# Patient Record
Sex: Female | Born: 1950
Health system: Southern US, Community
[De-identification: ages and names within clinical notes are randomized; demographics above are authoritative.]

## PROBLEM LIST (undated history)

## (undated) DIAGNOSIS — K259 Gastric ulcer, unspecified as acute or chronic, without hemorrhage or perforation: Secondary | ICD-10-CM

## (undated) DIAGNOSIS — S2239XA Fracture of one rib, unspecified side, initial encounter for closed fracture: Secondary | ICD-10-CM

## (undated) DIAGNOSIS — T7840XA Allergy, unspecified, initial encounter: Secondary | ICD-10-CM

## (undated) DIAGNOSIS — J45909 Unspecified asthma, uncomplicated: Secondary | ICD-10-CM

## (undated) DIAGNOSIS — D649 Anemia, unspecified: Secondary | ICD-10-CM

## (undated) DIAGNOSIS — I10 Essential (primary) hypertension: Secondary | ICD-10-CM

## (undated) DIAGNOSIS — B009 Herpesviral infection, unspecified: Secondary | ICD-10-CM

## (undated) DIAGNOSIS — K219 Gastro-esophageal reflux disease without esophagitis: Secondary | ICD-10-CM

## (undated) DIAGNOSIS — B019 Varicella without complication: Secondary | ICD-10-CM

## (undated) DIAGNOSIS — I341 Nonrheumatic mitral (valve) prolapse: Secondary | ICD-10-CM

## (undated) DIAGNOSIS — Z789 Other specified health status: Secondary | ICD-10-CM

## (undated) DIAGNOSIS — S2249XA Multiple fractures of ribs, unspecified side, initial encounter for closed fracture: Secondary | ICD-10-CM

## (undated) HISTORY — PX: ADENOIDECTOMY: SUR15

## (undated) HISTORY — DX: Gastro-esophageal reflux disease without esophagitis: K21.9

## (undated) HISTORY — PX: TONSILLECTOMY: SUR1361

## (undated) HISTORY — DX: Varicella without complication: B01.9

## (undated) HISTORY — DX: Allergy, unspecified, initial encounter: T78.40XA

## (undated) HISTORY — PX: APPENDECTOMY: SHX54

## (undated) HISTORY — DX: Essential (primary) hypertension: I10

## (undated) HISTORY — DX: Fracture of one rib, unspecified side, initial encounter for closed fracture: S22.39XA

## (undated) HISTORY — DX: Nonrheumatic mitral (valve) prolapse: I34.1

## (undated) HISTORY — DX: Gastric ulcer, unspecified as acute or chronic, without hemorrhage or perforation: K25.9

## (undated) HISTORY — DX: Herpesviral infection, unspecified: B00.9

## (undated) HISTORY — PX: HYSTEROSCOPY: SHX211

## (undated) HISTORY — DX: Anemia, unspecified: D64.9

## (undated) HISTORY — PX: HX TONSILLECTOMY: SHX27

## (undated) HISTORY — DX: Other specified health status: Z78.9

## (undated) HISTORY — PX: HX SEPTOPLASTY: SHX37

## (undated) HISTORY — PX: SINUS SURGERY: SHX187

## (undated) HISTORY — PX: HX APPENDECTOMY: SHX54

## (undated) HISTORY — PX: RIB FRACTURE SURGERY: SHX2358

---
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
## 2001-09-17 ENCOUNTER — Ambulatory Visit (HOSPITAL_COMMUNITY): Admission: RE | Admit: 2001-09-17 | Discharge: 2001-09-17 | Payer: Self-pay | Admitting: Gastroenterology

## 2003-06-09 ENCOUNTER — Encounter: Admission: RE | Admit: 2003-06-09 | Discharge: 2003-06-09 | Payer: Self-pay | Admitting: Orthopaedic Surgery

## 2005-02-06 ENCOUNTER — Other Ambulatory Visit: Admission: RE | Admit: 2005-02-06 | Discharge: 2005-02-06 | Payer: Self-pay | Admitting: Family Medicine

## 2005-02-14 ENCOUNTER — Encounter: Admission: RE | Admit: 2005-02-14 | Discharge: 2005-02-14 | Payer: Self-pay | Admitting: Family Medicine

## 2005-02-25 ENCOUNTER — Ambulatory Visit (HOSPITAL_COMMUNITY): Admission: RE | Admit: 2005-02-25 | Discharge: 2005-02-25 | Payer: Self-pay | Admitting: Family Medicine

## 2005-03-27 ENCOUNTER — Ambulatory Visit (HOSPITAL_BASED_OUTPATIENT_CLINIC_OR_DEPARTMENT_OTHER): Admission: RE | Admit: 2005-03-27 | Discharge: 2005-03-27 | Payer: Self-pay | Admitting: Obstetrics and Gynecology

## 2006-03-25 ENCOUNTER — Other Ambulatory Visit: Admission: RE | Admit: 2006-03-25 | Discharge: 2006-03-25 | Payer: Self-pay | Admitting: Obstetrics and Gynecology

## 2008-07-21 DIAGNOSIS — S82002A Unspecified fracture of left patella, initial encounter for closed fracture: Secondary | ICD-10-CM

## 2008-07-21 HISTORY — DX: Unspecified fracture of left patella, initial encounter for closed fracture: S82.002A

## 2012-01-06 LAB — HM MAMMOGRAPHY

## 2012-07-21 HISTORY — PX: NASAL SINUS SURGERY: SHX719

## 2012-12-09 DIAGNOSIS — J342 Deviated nasal septum: Secondary | ICD-10-CM | POA: Insufficient documentation

## 2012-12-09 HISTORY — DX: Deviated nasal septum: J34.2

## 2012-12-26 ENCOUNTER — Emergency Department (HOSPITAL_BASED_OUTPATIENT_CLINIC_OR_DEPARTMENT_OTHER): Payer: BC Managed Care – PPO

## 2012-12-26 ENCOUNTER — Emergency Department (HOSPITAL_BASED_OUTPATIENT_CLINIC_OR_DEPARTMENT_OTHER)
Admission: EM | Admit: 2012-12-26 | Discharge: 2012-12-26 | Disposition: A | Discharge status: Home / Self Care | Payer: BC Managed Care – PPO | Attending: Emergency Medicine | Admitting: Emergency Medicine

## 2012-12-26 ENCOUNTER — Encounter (HOSPITAL_BASED_OUTPATIENT_CLINIC_OR_DEPARTMENT_OTHER): Payer: Self-pay

## 2012-12-26 DIAGNOSIS — R059 Cough, unspecified: Secondary | ICD-10-CM | POA: Insufficient documentation

## 2012-12-26 DIAGNOSIS — R05 Cough: Secondary | ICD-10-CM | POA: Insufficient documentation

## 2012-12-26 DIAGNOSIS — J3489 Other specified disorders of nose and nasal sinuses: Secondary | ICD-10-CM | POA: Insufficient documentation

## 2012-12-26 DIAGNOSIS — J45909 Unspecified asthma, uncomplicated: Secondary | ICD-10-CM | POA: Insufficient documentation

## 2012-12-26 DIAGNOSIS — R509 Fever, unspecified: Secondary | ICD-10-CM | POA: Insufficient documentation

## 2012-12-26 DIAGNOSIS — E119 Type 2 diabetes mellitus without complications: Secondary | ICD-10-CM | POA: Insufficient documentation

## 2012-12-26 DIAGNOSIS — Z880 Allergy status to penicillin: Secondary | ICD-10-CM | POA: Insufficient documentation

## 2012-12-26 DIAGNOSIS — M546 Pain in thoracic spine: Secondary | ICD-10-CM | POA: Insufficient documentation

## 2012-12-26 HISTORY — DX: Unspecified asthma, uncomplicated: J45.909

## 2012-12-26 LAB — CBC WITH DIFFERENTIAL/PLATELET
Basophils Absolute: 0 10*3/uL (ref 0.0–0.1)
Basophils Relative: 0 % (ref 0–1)
Eosinophils Absolute: 0 10*3/uL (ref 0.0–0.7)
Eosinophils Relative: 0 % (ref 0–5)
HCT: 34.9 % — ABNORMAL LOW (ref 36.0–46.0)
Hemoglobin: 11.7 g/dL — ABNORMAL LOW (ref 12.0–15.0)
Lymphocytes Relative: 49 % — ABNORMAL HIGH (ref 12–46)
Lymphs Abs: 3 10*3/uL (ref 0.7–4.0)
MCH: 31.8 pg (ref 26.0–34.0)
MCHC: 33.5 g/dL (ref 30.0–36.0)
MCV: 94.8 fL (ref 78.0–100.0)
Monocytes Absolute: 0.9 10*3/uL (ref 0.1–1.0)
Monocytes Relative: 14 % — ABNORMAL HIGH (ref 3–12)
Neutro Abs: 2.2 10*3/uL (ref 1.7–7.7)
Neutrophils Relative %: 36 % — ABNORMAL LOW (ref 43–77)
Platelets: 179 10*3/uL (ref 150–400)
RBC: 3.68 MIL/uL — ABNORMAL LOW (ref 3.87–5.11)
RDW: 13.6 % (ref 11.5–15.5)
WBC: 6.1 10*3/uL (ref 4.0–10.5)

## 2012-12-26 LAB — BASIC METABOLIC PANEL
BUN: 15 mg/dL (ref 6–23)
CO2: 27 mEq/L (ref 19–32)
Calcium: 8.9 mg/dL (ref 8.4–10.5)
Chloride: 106 mEq/L (ref 96–112)
Creatinine, Ser: 0.9 mg/dL (ref 0.50–1.10)
GFR calc Af Amer: 79 mL/min — ABNORMAL LOW (ref >90)
GFR calc non Af Amer: 68 mL/min — ABNORMAL LOW (ref >90)
Glucose, Bld: 95 mg/dL (ref 70–99)
Potassium: 3.9 mEq/L (ref 3.5–5.1)
Sodium: 141 mEq/L (ref 135–145)

## 2012-12-26 LAB — TROPONIN I: Troponin I: <0.3 ng/mL (ref <0.30)

## 2012-12-26 LAB — D-DIMER, QUANTITATIVE: D-Dimer, Quant: 0.61 ug/mL-FEU — ABNORMAL HIGH (ref 0.00–0.48)

## 2012-12-26 MED ORDER — OXYCODONE-ACETAMINOPHEN 5-325 MG PO TABS
2.0000 | ORAL_TABLET | Freq: Once | ORAL | Status: AC
  Administered 2012-12-26: 2 via ORAL
  Filled 2012-12-26 (×2): qty 2

## 2012-12-26 MED ORDER — IOHEXOL 300 MG/ML  SOLN
80.0000 mL | Freq: Once | INTRAMUSCULAR | Status: AC | PRN
  Administered 2012-12-26: 80 mL via INTRAVENOUS

## 2012-12-26 MED ORDER — OXYCODONE-ACETAMINOPHEN 5-325 MG PO TABS: 1.0000 | ORAL_TABLET | ORAL | Status: DC | PRN

## 2012-12-26 MED ORDER — ASPIRIN 81 MG PO CHEW
324.0000 mg | CHEWABLE_TABLET | Freq: Once | ORAL | Status: AC
  Administered 2012-12-26: 324 mg via ORAL
  Filled 2012-12-26: qty 4

## 2012-12-26 NOTE — ED Notes (Signed)
Pt states that she has been having sinus congestion, difficulty breathing, chest pain in the upper chest, and upper back pain.  Pt states that she recently traveled 100 miles in the car, denies any other long distance travel.  Hx of asthma, uses inhaler every 2-3 hours.

## 2012-12-26 NOTE — ED Provider Notes (Signed)
History     CSN: [id number]  Arrival date & time 12/26/12  1005   First MD Initiated Contact with Patient 12/26/12 1024      Chief Complaint  Patient presents with  . Nasal Congestion  . Chest Pain    (Consider location/radiation/quality/duration/timing/severity/associated sxs/prior treatment) HPI Patient had sinus surgery at [facility] op on 5/22.  She states she has had pain between shoulder blades 5/24 with associated chest pain pressure anterior chest with sharp pain between shoulder blades constant eases with rest worsens with exertion.  Seen by ent recheck on 5/27 and told she had bronchitis and started on levaquin for 10 days.  Patient has had some cough with low grade fever of 99 to 101.5 since Saturday afte surgery.  Denies post op sinus problems except some drainage which is improved post op.  Patient called Dr. Foy Guadalajara on 6/6 because ent canceled post op appointment but unable to see.  Pain is worsening- patient taking albuterol without relief.  States pain is similar to previous asthma attacks.  Pain worse than prior but similar type of pain.  Pain is 8/10 today.  Pain has been 6/10.    Past Medical History  Diagnosis Date  . Asthma   . Diabetes mellitus without complication     Past Surgical History  Procedure Laterality Date  . Nasal sinus surgery  12/09/2012    performed at [facility]  . Tonsillectomy    . Appendectomy      History reviewed. No pertinent family history.  History  Substance Use Topics  . Smoking status: Never Smoker   . Smokeless tobacco: Never Used  . Alcohol Use: No    OB History   Grav Para Term Preterm Abortions TAB SAB Ect Mult Living                  Review of Systems  All other systems reviewed and are negative.    Allergies  Erythromycin; Penicillins; Phenylephrine; Tropicamide; and Neosporin  Home Medications  No current outpatient prescriptions on file.  BP 126/81  Pulse 94  Temp(Src) 98.4 F (36.9 C) (Oral)  Resp 20  Ht 5\' 4"   (1.626 m)  Wt 140 lb (63.504 kg)  BMI 24.02 kg/m2  SpO2 97%  Physical Exam  Nursing note and vitals reviewed. Constitutional: She appears well-developed and well-nourished.  HENT:  Head: Normocephalic and atraumatic.  Right Ear: External ear normal.  Left Ear: External ear normal.    ED Course  Procedures (including critical care time)  Labs Reviewed  BASIC METABOLIC PANEL  TROPONIN I  CBC WITH DIFFERENTIAL   No results found.   No diagnosis found.  Results for orders placed during the hospital encounter of 12/26/12  BASIC METABOLIC PANEL      Result Value Range   Sodium 141  135 - 145 mEq/L   Potassium 3.9  3.5 - 5.1 mEq/L   Chloride 106  96 - 112 mEq/L   CO2 27  19 - 32 mEq/L   Glucose, Bld 95  70 - 99 mg/dL   BUN 15  6 - 23 mg/dL   Creatinine, Ser 9.62  0.50 - 1.10 mg/dL   Calcium 8.9  8.4 - 95.2 mg/dL   GFR calc non Af Amer 68 (*) >90 mL/min   GFR calc Af Amer 79 (*) >90 mL/min  TROPONIN I      Result Value Range   Troponin I <0.30  <0.30 ng/mL  CBC WITH DIFFERENTIAL  Result Value Range   WBC 6.1  4.0 - 10.5 K/uL   RBC 3.68 (*) 3.87 - 5.11 MIL/uL   Hemoglobin 11.7 (*) 12.0 - 15.0 g/dL   HCT 45.4 (*) 09.8 - 11.9 %   MCV 94.8  78.0 - 100.0 fL   MCH 31.8  26.0 - 34.0 pg   MCHC 33.5  30.0 - 36.0 g/dL   RDW 14.7  82.9 - 56.2 %   Platelets 179  150 - 400 K/uL   Neutrophils Relative % 36 (*) 43 - 77 %   Neutro Abs 2.2  1.7 - 7.7 K/uL   Lymphocytes Relative 49 (*) 12 - 46 %   Lymphs Abs 3.0  0.7 - 4.0 K/uL   Monocytes Relative 14 (*) 3 - 12 %   Monocytes Absolute 0.9  0.1 - 1.0 K/uL   Eosinophils Relative 0  0 - 5 %   Eosinophils Absolute 0.0  0.0 - 0.7 K/uL   Basophils Relative 0  0 - 1 %   Basophils Absolute 0.0  0.0 - 0.1 K/uL   WBC Morphology MILD LEFT SHIFT (1-5% METAS, OCC MYELO, OCC BANDS)    D-DIMER, QUANTITATIVE      Result Value Range   D-Dimer, Quant 0.61 (*) 0.00 - 0.48 ug/mL-FEU   No information on file.   MDM  No cause of cp  found- patient with normal ekg and ct angio- patient has had cp ongoing for over two weeks and doubt cardiac etiology.  Patient is ttp and does not have an underlying compression fracture.  Plan short course of percocet as quelled symptoms well while in department and close f/u with Dr. Foy Guadalajara and ent.       Hilario Quarry, MD 12/26/12 [phone]

## 2013-11-23 ENCOUNTER — Other Ambulatory Visit: Payer: Self-pay | Admitting: Gynecology

## 2013-11-23 ENCOUNTER — Ambulatory Visit (INDEPENDENT_AMBULATORY_CARE_PROVIDER_SITE_OTHER): Payer: BC Managed Care – PPO

## 2013-11-23 ENCOUNTER — Encounter: Payer: Self-pay | Admitting: Gynecology

## 2013-11-23 ENCOUNTER — Ambulatory Visit (INDEPENDENT_AMBULATORY_CARE_PROVIDER_SITE_OTHER): Payer: BC Managed Care – PPO | Admitting: Gynecology

## 2013-11-23 VITALS — BP 132/88 | Ht 62.25 in | Wt 150.0 lb

## 2013-11-23 DIAGNOSIS — R102 Pelvic and perineal pain: Secondary | ICD-10-CM

## 2013-11-23 DIAGNOSIS — N838 Other noninflammatory disorders of ovary, fallopian tube and broad ligament: Secondary | ICD-10-CM

## 2013-11-23 DIAGNOSIS — Z780 Asymptomatic menopausal state: Secondary | ICD-10-CM

## 2013-11-23 DIAGNOSIS — N9489 Other specified conditions associated with female genital organs and menstrual cycle: Secondary | ICD-10-CM

## 2013-11-23 DIAGNOSIS — N949 Unspecified condition associated with female genital organs and menstrual cycle: Secondary | ICD-10-CM

## 2013-11-23 DIAGNOSIS — N839 Noninflammatory disorder of ovary, fallopian tube and broad ligament, unspecified: Secondary | ICD-10-CM

## 2013-11-23 DIAGNOSIS — N951 Menopausal and female climacteric states: Secondary | ICD-10-CM

## 2013-11-23 NOTE — Progress Notes (Signed)
   Patient is a 63 year old who has not been seen in this office since 2007. The patient's recent to present to the office was having right lower quadrant sometimes radiating to her left. Patient stated her PCP did her Pap smear this year and she also had a normal colonoscopy this year. Patient was never on hormone replacement therapy.   She had endometrial biopsy which was benign in 2007 and had a hysteroscopy D&C in 2006  whereby an endometrial polyp was removed and simple hyperplasia had been noted. Patient has always had a normal Pap smear. Patient denies any change in bowel movements or urination. She denies any back pain. She denies any dyspareunia or any vaginal bleeding or dryness.  Exam: Abdomen: Soft slightly tender in the right lower quadrant but no rebound or guarding Pelvic: And urethra Skene was within normal limits Vagina: Atrophic changes were noted Cervix: No lesions or discharge Uterus: Anteverted normal size shape and consistency Adnexa: No palpable mass or tenderness Rectal exam: Unremarkable  Ultrasound today: Uterus measures 6.5 x 4.7 x 3.2 cm with endometrial stripe of 1.5 mm. Right ovary with a solid focal mass but her level by 9 x 11 mm avascular with calcifications noted. Left ear was normal no fluid in the cul-de-sac.  Patient completing 10 days antibiotic treatment for whooping cough continues to be coughing and had temperature of 101. Patient be followed with her PCP tomorrow and I will be checking with him and Dr.Fried to see if for staph needs to be prophylactically treated. [facility] Nurse has been contacted as well.  Assessment/plan: Patient with right lower quadrant discomfort. Recent colonoscopy negative. I explained to the patient this is a very small calcified area on the right ovary probably a fibroma? We will do a CA125  with its limitations discussed. Would recommend followup ultrasound in 3 months.  Addendum: Just spoke with the patient's PCP and he had  mentioned that one of his new associates had seen the patient with this cough and obtain antibodies to proptosis and not a culture per se and the antibodies were positive for IgG but negative for IgM indicating old exposure. Nevertheless patient did receive the Tdap vaccine on favor in its wall. He has made a pulmonary consultation for her in the next week in reference to her persistent cough. He does not feel that R. staph needs to be treated at the present time but if anything changes he will contact us. I will relate this to our staff as well.

## 2013-11-25 ENCOUNTER — Encounter: Payer: Self-pay | Admitting: Obstetrics and Gynecology

## 2013-12-02 ENCOUNTER — Encounter: Payer: Self-pay | Admitting: Internal Medicine

## 2013-12-02 ENCOUNTER — Ambulatory Visit (INDEPENDENT_AMBULATORY_CARE_PROVIDER_SITE_OTHER)
Admission: RE | Admit: 2013-12-02 | Discharge: 2013-12-02 | Disposition: A | Discharge status: Home / Self Care | Payer: BC Managed Care – PPO | Source: Ambulatory Visit | Attending: Internal Medicine | Admitting: Internal Medicine

## 2013-12-02 ENCOUNTER — Ambulatory Visit (INDEPENDENT_AMBULATORY_CARE_PROVIDER_SITE_OTHER): Payer: BC Managed Care – PPO | Admitting: Internal Medicine

## 2013-12-02 VITALS — BP 112/70 | HR 70 | Temp 97.7°F | Ht 64.0 in | Wt 154.0 lb

## 2013-12-02 DIAGNOSIS — R059 Cough, unspecified: Secondary | ICD-10-CM

## 2013-12-02 DIAGNOSIS — R05 Cough: Secondary | ICD-10-CM

## 2013-12-02 DIAGNOSIS — R058 Other specified cough: Secondary | ICD-10-CM | POA: Insufficient documentation

## 2013-12-02 MED ORDER — FAMOTIDINE 20 MG PO TABS: ORAL_TABLET | ORAL | Status: DC

## 2013-12-02 MED ORDER — TRAMADOL HCL 50 MG PO TABS: ORAL_TABLET | ORAL | Status: DC

## 2013-12-02 MED ORDER — PREDNISONE 10 MG PO TABS: ORAL_TABLET | ORAL | Status: DC

## 2013-12-02 MED ORDER — PANTOPRAZOLE SODIUM 40 MG PO TBEC: 40.0000 mg | DELAYED_RELEASE_TABLET | Freq: Every day | ORAL | Status: DC

## 2013-12-02 NOTE — Progress Notes (Signed)
Subjective:    Patient ID: Alexandria CordiaDebra T Gaal, female    DOB: 05/22/1951  MRN: [id number]  HPI  [id number] [id number] never smoker with onset of recurrent ear aches in 20's and in  [place] with allergy shots and seemed some better x 10 years of rx and they didn't seem to flare when stopped them in [place]  But also in 20s noted tendency to bad uri's going to chest typcially around xmas and fall which did not get better on shots - started in inhaler in 30s but only needed intermittently then moved to [place] in 1999 much worse since around 2010  with more severe, more frequent but no need for inhaler between episodes and attributed to sinus problems so underwent sinus [facility] May 2014 [facility] Sinus surgery complicated by worse cough [facility] > no change in rx as recovered completely again with no need for maint rx  but much worse since Winter of 2015 when dx'd with hooping cough > rx  dulera and singulair no better so referred 12/02/2013 by Dr Foy GuadalajaraFried for pulmonary eval.   12/02/2013 1st [facility] office visit/ Wert  Chief Complaint  Patient presents with  . Pulmonary Consult    Referred per Dr. Marinda Elkobert Fried. Pt c/o cough and SOB since Feb 2015.  She states that she is SOB "all of the time" with or without exertion. Cough is non prod at this time. She did have prod cough 1 wk ago with "white paste" looking sputum.   cough came on abruptly in Feb 2015  Assoc with change in voice and seems worse around 2 h after getting up then around 3-5 pm min productive until vomits. Only sob with cough    Kouffman Reflux v Neurogenic Cough Differentiator Reflux Comments  Do you awaken from a sound sleep coughing violently?                            With trouble breathing? Most nights   Do you have choking episodes when you cannot  Get enough air, gasping for air ?              Yes   Do you usually cough when you lie down into  The bed, or when you just lie down to rest ?                          no   Do  you usually cough after meals or eating?         Not really    Do you cough when (or after) you bend over?    No   GERD SCORE     Kouffman Reflux v Neurogenic Cough Differentiator Neurogenic   Do you more-or-less cough all day long? yes   Does change of temperature make you cough? no   Does laughing or chuckling cause you to cough? yes   Do fumes (perfume, automobile fumes, burned  Toast, etc.,) cause you to cough ?      Not aware   Does speaking, singing, or talking on the phone cause you to cough   ?               yes   Neurogenic/Airway score     No obvious other patterns in day to day or daytime variabilty or assoc   cp or chest tightness, subjective wheeze  overt sinus or hb symptoms. No unusual exp hx or h/o childhood pna/ asthma or knowledge of premature birth.  Sleeping ok without nocturnal  or early am exacerbation  of respiratory  c/o's or need for noct saba. Also denies any obvious fluctuation of symptoms with weather or environmental changes or other aggravating or alleviating factors except as outlined above   Current Medications, Allergies, Complete Past Medical History, Past Surgical History, Family History, and Social History were reviewed in [facility] electronic medical record.           Review of Systems  Constitutional: Negative for fever, chills and unexpected weight change.  HENT: Positive for congestion, ear pain and sore throat. Negative for dental problem, nosebleeds, postnasal drip, rhinorrhea, sinus pressure, sneezing, trouble swallowing and voice change.   Eyes: Negative for visual disturbance.  Respiratory: Positive for cough and shortness of breath. Negative for choking.   Cardiovascular: Positive for chest pain. Negative for leg swelling.  Gastrointestinal: Positive for abdominal pain. Negative for vomiting and diarrhea.  Genitourinary: Negative for difficulty urinating.  Musculoskeletal: Negative for arthralgias.  Skin: Negative for rash.    Neurological: Negative for tremors, syncope and headaches.  Hematological: Does not bruise/bleed easily.       Objective:   Physical Exam  amb wf harsh barking cough  Wt Readings from Last 3 Encounters:  12/02/13 154 lb (69.854 kg)  11/23/13 150 lb (68.04 kg)  12/26/12 140 lb (63.504 kg)      HEENT: nl dentition, turbinates, and orophanx. Nl external ear canals without cough reflex   NECK :  without JVD/Nodes/TM/ nl carotid upstrokes bilaterally   LUNGS: no acc muscle use, clear to A and P bilaterally without cough on insp or exp maneuvers   CV:  RRR  no s3 or murmur or increase in P2, no edema   ABD:  soft and nontender with nl excursion in the supine position. No bruits or organomegaly, bowel sounds nl  MS:  warm without deformities, calf tenderness, cyanosis or clubbing  SKIN: warm and dry without lesions    NEURO:  alert, approp, no deficits     cxr 12/02/13  There is no edema or consolidation. The heart size and pulmonary vascularity are normal. No adenopathy. There is lower thoracic levoscoliosis      Assessment & Plan:

## 2013-12-02 NOTE — Patient Instructions (Signed)
The key to effective treatment for your cough is eliminating the non-stop cycle of cough you're stuck in long enough to let your airway heal completely and then see if there is anything still making you cough once you stop the cough suppression, but this should take no more than 5 days to figure out  First take delsym two tsp every 12 hours and supplement if needed with  tramadol 50 mg up to 2 every 4 hours to suppress the urge to cough at all or even clear your throat. Swallowing water or using ice chips/non mint and menthol containing candies (such as lifesavers or sugarless jolly ranchers) are also effective.  You should rest your voice and avoid activities that you know make you cough.  Once you have eliminated the cough for 3 straight days try reducing the tramadol first,  then the delsym as tolerated.    Prednisone 10 mg take  4 each am x 2 days,   2 each am x 2 days,  1 each am x 2 days and stop (this is to eliminate allergies and inflammation from coughing)  Protonix (pantoprazole) Take 30-60 min before first meal of the day and Pepcid 20 mg one bedtime plus chlorpheniramine 4 mg x 2 at bedtime (both available over the counter)  until cough is completely gone for at least a week without the need for cough suppression  GERD (REFLUX)  is an extremely common cause of respiratory symptoms, many times with no significant heartburn at all.    It can be treated with medication, but also with lifestyle changes including avoidance of late meals, excessive alcohol, smoking cessation, and avoid fatty foods, chocolate, peppermint, colas, red wine, and acidic juices such as orange juice.  NO MINT OR MENTHOL PRODUCTS SO NO COUGH DROPS  USE HARD CANDY INSTEAD (jolley ranchers or Stover's or Lifesavers (all available in sugarless versions) NO OIL BASED VITAMINS - use powdered substitutes.  If not all better after this will need to return

## 2013-12-03 NOTE — Assessment & Plan Note (Addendum)
The most common causes of chronic cough in immunocompetent adults include the following: upper airway cough syndrome (UACS), previously referred to as postnasal drip syndrome (PNDS), which is caused by variety of rhinosinus conditions; (2) asthma; (3) GERD; (4) chronic bronchitis from cigarette smoking or other inhaled environmental irritants; (5) nonasthmatic eosinophilic bronchitis; and (6) bronchiectasis.   These conditions, singly or in combination, have accounted for up to 94% of the causes of chronic cough in prospective studies.   Other conditions have constituted no >6% of the causes in prospective studies These have included bronchogenic carcinoma, chronic interstitial pneumonia, sarcoidosis, left ventricular failure, ACEI-induced cough, and aspiration from a condition associated with pharyngeal dysfunction.    Chronic cough is often simultaneously caused by more than one condition. A single cause has been found from 38 to 82% of the time, multiple causes from 18 to 62%. Multiply caused cough has been the result of three diseases up to 42% of the time.       Based on hx and exam, this is most likely:  Classic Upper airway cough syndrome, so named because it's frequently impossible to sort out how much is  CR/sinusitis with freq throat clearing (which can be related to primary GERD)   vs  causing  secondary (" extra esophageal")  GERD from wide swings in gastric pressure that occur with throat clearing, often  promoting self use of mint and menthol lozenges that reduce the lower esophageal sphincter tone and exacerbate the problem further in a cyclical fashion.   These are the same pts (now being labeled as having "irritable larynx syndrome" by some cough centers) who not infrequently have a history of having failed to tolerate ace inhibitors,  dry powder inhalers or biphosphonates or report having atypical reflux symptoms that don't respond to standard doses of PPI , and are easily confused as  having aecopd or asthma flares (which I think is unlikely based on lack of response to dulera and singulair) by even experienced allergists/ pulmonologists.   The first step is to maximize acid suppression and eliminate cyclical coughing then regroup if the cough persists.  See instructions for specific recommendations which were reviewed directly with the patient who was given a copy with highlighter outlining the key components.

## 2013-12-05 ENCOUNTER — Telehealth: Payer: Self-pay | Admitting: *Deleted

## 2013-12-05 ENCOUNTER — Other Ambulatory Visit: Payer: Self-pay | Admitting: *Deleted

## 2013-12-05 DIAGNOSIS — M25559 Pain in unspecified hip: Secondary | ICD-10-CM

## 2013-12-05 NOTE — Progress Notes (Signed)
Quick Note:  Spoke with pt and notified of results per Dr. Wert. Pt verbalized understanding and denied any questions.  ______ 

## 2013-12-05 NOTE — Telephone Encounter (Signed)
Pt informed with the below note. 

## 2013-12-05 NOTE — Telephone Encounter (Signed)
Patient first of all well need to be seen for preoperative consultation and further discussion. Procedure typically will take about an hour and a half. Recovery is 2 weeks

## 2013-12-05 NOTE — Telephone Encounter (Signed)
Pt was seen on 11/23/13 states you spoke with her about possibly removing her ovaries if needed? Pt has 2 questions:  1.recovery time ? 2. How long would procedure take?

## 2013-12-05 NOTE — Telephone Encounter (Signed)
Per note CA-125 lab will be placed per note on 11/23/13, order will be placed.

## 2013-12-07 ENCOUNTER — Telehealth: Payer: Self-pay | Admitting: Internal Medicine

## 2013-12-07 NOTE — Telephone Encounter (Signed)
Made patient appt for 5/21. Nothing further needed at this time.

## 2013-12-07 NOTE — Telephone Encounter (Signed)
LMTCB for the pt  Per last ov note if no better will need ov

## 2013-12-08 ENCOUNTER — Ambulatory Visit (INDEPENDENT_AMBULATORY_CARE_PROVIDER_SITE_OTHER): Payer: BC Managed Care – PPO | Admitting: Internal Medicine

## 2013-12-08 ENCOUNTER — Other Ambulatory Visit (INDEPENDENT_AMBULATORY_CARE_PROVIDER_SITE_OTHER): Payer: BC Managed Care – PPO

## 2013-12-08 ENCOUNTER — Other Ambulatory Visit: Payer: BC Managed Care – PPO

## 2013-12-08 ENCOUNTER — Encounter: Payer: Self-pay | Admitting: Internal Medicine

## 2013-12-08 ENCOUNTER — Other Ambulatory Visit: Payer: Self-pay | Admitting: Anesthesiology

## 2013-12-08 VITALS — BP 142/84 | HR 70 | Ht 64.0 in | Wt 150.0 lb

## 2013-12-08 DIAGNOSIS — M25559 Pain in unspecified hip: Secondary | ICD-10-CM

## 2013-12-08 DIAGNOSIS — R059 Cough, unspecified: Secondary | ICD-10-CM

## 2013-12-08 DIAGNOSIS — R05 Cough: Secondary | ICD-10-CM

## 2013-12-08 LAB — CBC WITH DIFFERENTIAL/PLATELET
Basophils Absolute: 0 10*3/uL (ref 0.0–0.1)
Basophils Relative: 0.2 % (ref 0.0–3.0)
EOS PCT: 0.1 % (ref 0.0–5.0)
Eosinophils Absolute: 0 10*3/uL (ref 0.0–0.7)
HCT: 39.3 % (ref 36.0–46.0)
Hemoglobin: 13.4 g/dL (ref 12.0–15.0)
Lymphocytes Relative: 21.1 % (ref 12.0–46.0)
Lymphs Abs: 1.9 10*3/uL (ref 0.7–4.0)
MCHC: 34.1 g/dL (ref 30.0–36.0)
MCV: 95.5 fl (ref 78.0–100.0)
MONO ABS: 0.3 10*3/uL (ref 0.1–1.0)
Monocytes Relative: 3.5 % (ref 3.0–12.0)
NEUTROS PCT: 75.1 % (ref 43.0–77.0)
Neutro Abs: 6.8 10*3/uL (ref 1.4–7.7)
PLATELETS: 298 10*3/uL (ref 150.0–400.0)
RBC: 4.11 Mil/uL (ref 3.87–5.11)
RDW: 14 % (ref 11.5–15.5)
WBC: 9 10*3/uL (ref 4.0–10.5)

## 2013-12-08 MED ORDER — PREDNISONE 10 MG PO TABS: ORAL_TABLET | ORAL | Status: DC

## 2013-12-08 MED ORDER — GABAPENTIN 100 MG PO CAPS: 100.0000 mg | ORAL_CAPSULE | Freq: Three times a day (TID) | ORAL | Status: DC

## 2013-12-08 MED ORDER — ACETAMINOPHEN-CODEINE #4 300-60 MG PO TABS: ORAL_TABLET | ORAL | Status: DC

## 2013-12-08 NOTE — Assessment & Plan Note (Signed)
-   cyclical cough regimen initiated 12/03/2013 > failed to suppress on tramadol 2 q4 but only used 20 in 6 days (averaging a little over 3 a day)  I had an extended discussion with the patient today lasting 15 to 20 minutes of a 25 minute visit on the following issues:  She did not approach 100% cough suppression even though she says she doesn't cough when she lies down so suggested she take tylenol #4 and lie down if that's what it takes to eliminate the cycle of cough she is stuck in   See instructions for specific recommendations which were reviewed directly with the patient who was given a copy with highlighter outlining the key components.

## 2013-12-08 NOTE — Patient Instructions (Addendum)
The key to effective treatment for your cough is eliminating the non-stop cycle of cough you're stuck in long enough to let your airway heal completely and then see if there is anything still making you cough once you stop the cough suppression, but this should take no more than 5 days to figure out  First take delsym two tsp every 12 hours and supplement if needed with  Tylenol #4  1 every 4 hours  to suppress the urge to cough at all or even clear your throat. Swallowing water or using ice chips/non mint and menthol containing candies (such as lifesavers or sugarless jolly ranchers) are also effective.  You should rest your voice and avoid activities that you know make you cough.  Once you have eliminated the cough for 3 straight days try reducing the Tylenol #4 first,  then the delsym as tolerated.    Neurontin 100 mg three times a day as a new maintain  Prednisone 10 mg take  4 each am x 2 days,   2 each am x 2 days,  1 each am x 2 days and stop (this is to eliminate allergies and inflammation from coughing)   Protonix (pantoprazole) Take 30-60 min before first meal of the day and Pepcid 20 mg one bedtime plus chlorpheniramine 4 mg x 2 at bedtime (both available over the counter)  And up to 4 hours during the day for sense of drainage.  Only use your albuterol as a rescue medication to be used if you can't catch your breath  tern.  - The less you use it, the better it will work when you need it. - Ok to use up to 2 puffs  every 4 hours if you must but call for immediate appointment if use goes up over your usual need - Don't leave home without it !!  (think of it like the spare tire for your car)   Please remember to go to the lab  department downstairs for your tests - we will call you with the results when they are available.    Please schedule a follow up office visit in 2 weeks, sooner if needed with all medications in hand

## 2013-12-08 NOTE — Progress Notes (Signed)
Subjective:    Patient ID: Alexandria Rush, female    DOB: 01/30/1951  MRN: [id number]    Brief patient profile:  [age] yowf never smoker with onset of recurrent ear aches in 20's and in  [place] allergy [place] and seemed some better x 10 years of rx and they didn't seem to flare when [place] them in [place]  But also in 20s noted tendency to bad uri's going to chest typcially around xmas and fall which did not get better on shots - started inhaler in 30s but only needed intermittently then [place] much worse since around 2010  with more severe, more frequent episodes but no need for inhaler between flares and attributed to sinus problems so underwent [facility] May 2014 [facility] complicated by worse cough [facility] > no change in rx as recovered completely again with no need for maint rx  but much worse since Winter of 2015 when dx'd with hooping cough > rx  dulera and singulair no better so referred 12/02/2013 by Dr Foy GuadalajaraFried for pulmonary eval.  History of Present Illness  12/02/2013 1st  Pulmonary office visit/ Alvera Tourigny  Chief Complaint  Patient presents with  . Pulmonary Consult    Referred per Dr. Marinda Elkobert Fried. Pt c/o cough and SOB since Feb 2015.  She states that she is SOB "all of the time" with or without exertion. Cough is non prod at this time. She did have prod cough 1 wk ago with "white paste" looking sputum.   cough came on abruptly in Feb 2015  Assoc with change in voice and seems worse around 2 h after getting up then around 3-5 pm min productive until vomits. Only sob with cough rec The key to effective treatment for your cough is eliminating the non-stop cycle of cough   First take delsym two tsp every 12 hours and supplement if needed with  tramadol 50 mg up to 2 every 4 hours to suppress the urge to cough at all or even clear your throat.   Once you have eliminated the cough for 3 straight days try reducing the tramadol first,  then  the delsym as tolerated.   Prednisone 10 mg take  4 each am x 2 days,   2 each am x 2 days,  1 each am x 2 days and stop (this is to eliminate allergies and inflammation from coughing) Protonix (pantoprazole) Take 30-60 min before first meal of the day and Pepcid 20 mg one bedtime plus chlorpheniramine 4 mg x 2 at bedtime (both available over the counter)  until cough is completely gone for at least a week without the need for cough suppression GERD diet     12/08/2013 f/u ov/Tresia Revolorio re:  Only 50% reduction on  while on tramadol but has over 20/40 left because "I never needed it at night" Chief Complaint  Patient presents with  . Acute Visit    Pt c/o PND, nonprod cough worse with exertion X3 months.       Kouffman Reflux v Neurogenic Cough Differentiator Reflux Comments  Do you awaken from a sound sleep coughing violently?                            With trouble breathing? no   Do you have choking episodes when you cannot  Get enough air, gasping for air ?  Yes, still   Do you usually cough when you lie down into  The bed, or when you just lie down to rest ?                          no   Do you usually cough after meals or eating?         Not really    Do you cough when (or after) you bend over?    No   GERD SCORE     Kouffman Reflux v Neurogenic Cough Differentiator Neurogenic   Do you more-or-less cough all day long? yes   Does change of temperature make you cough? no   Does laughing or chuckling cause you to cough? yes   Do fumes (perfume, automobile fumes, burned  Toast, etc.,) cause you to cough ?      Not aware   Does speaking, singing, or talking on the phone cause you to cough   ?               yes   Neurogenic/Airway score     No obvious other patterns in day to day or daytime variabilty or assoc sob or cp or chest tightness, subjective wheeze overt sinus or hb symptoms. No unusual exp hx or h/o childhood pna/ asthma or knowledge of premature birth.  Sleeping ok  without nocturnal  or early am exacerbation  of respiratory  c/o's or need for noct saba. Also denies any obvious fluctuation of symptoms with weather or environmental changes or other aggravating or alleviating factors except as outlined above   Current Medications, Allergies, Complete Past Medical History, Past Surgical History, Family History, and Social History were reviewed in [employer] record.  ROS  The following are not active complaints unless bolded sore throat, dysphagia, dental problems, itching, sneezing,  nasal congestion or excess/ purulent secretions, ear ache,   fever, chills, sweats, unintended wt loss, pleuritic or exertional cp, hemoptysis,  orthopnea pnd or leg swelling, presyncope, palpitations, heartburn, abdominal pain, anorexia, nausea, vomiting, diarrhea  or change in bowel or urinary habits, change in stools or urine, dysuria,hematuria,  rash, arthralgias, visual complaints, headache, numbness weakness or ataxia or problems with walking or coordination,  change in mood/affect or memory.     .                Objective:   Physical Exam  amb wf harsh barking cough  12/08/2013       150 Wt Readings from Last 3 Encounters:  12/02/13 154 lb (69.854 kg)  11/23/13 150 lb (68.04 kg)  12/26/12 140 lb (63.504 kg)      HEENT: nl dentition, turbinates, and orophanx. Nl external ear canals without cough reflex   NECK :  without JVD/Nodes/TM/ nl carotid upstrokes bilaterally   LUNGS: no acc muscle use, clear to A and P bilaterally without cough on insp or exp maneuvers   CV:  RRR  no s3 or murmur or increase in P2, no edema   ABD:  soft and nontender with nl excursion in the supine position. No bruits or organomegaly, bowel sounds nl  MS:  warm without deformities, calf tenderness, cyanosis or clubbing  SKIN: warm and dry without lesions    NEURO:  alert, approp, no deficits     cxr 12/02/13  There is no edema or consolidation. The  heart size and pulmonary vascularity are normal. No adenopathy. There is lower thoracic  levoscoliosis      Assessment & Plan:

## 2013-12-09 ENCOUNTER — Encounter: Payer: Self-pay | Admitting: Internal Medicine

## 2013-12-09 LAB — ALLERGY PROFILE REGION II-DC, DE, MD, ~~LOC~~, VA
ALLERGEN, D PTERNOYSSINUS, D1: 0.4 kU/L — AB
Alternaria Alternata: <0.1 kU/L
Aspergillus fumigatus, m3: <0.1 kU/L
Bermuda Grass: <0.1 kU/L
Cat Dander: <0.1 kU/L
Cockroach: <0.1 kU/L
Common Ragweed: <0.1 kU/L
D. FARINAE: 0.57 kU/L — AB
Dog Dander: <0.1 kU/L
Elm IgE: <0.1 kU/L
IgE (Immunoglobulin E), Serum: 12.7 IU/mL (ref 0.0–180.0)
Johnson Grass: <0.1 kU/L
Lamb's Quarters: <0.1 kU/L
Meadow Grass: <0.1 kU/L
Oak: <0.1 kU/L
Pecan/Hickory Tree IgE: <0.1 kU/L

## 2013-12-09 NOTE — Progress Notes (Signed)
Quick Note:  Spoke with pt and notified of results per Dr. Wert. Pt verbalized understanding and denied any questions.  ______ 

## 2013-12-13 ENCOUNTER — Telehealth: Payer: Self-pay | Admitting: Internal Medicine

## 2013-12-13 NOTE — Telephone Encounter (Signed)
Last seen 12/08/13--given recs to being new medications for her cough.   Patient Instructions: First take delsym two tsp every 12 hours and supplement if needed with Tylenol #4 1 every 4 hours to suppress the urge to cough at all or even clear your throat. Swallowing water or using ice chips/non mint and menthol containing candies (such as lifesavers or sugarless jolly ranchers) are also effective. You should rest your voice and avoid activities that you know make you cough.  Once you have eliminated the cough for 3 straight days try reducing the Tylenol #4 first, then the delsym as tolerated.  Neurontin 100 mg three times a day as a new maintain  Prednisone 10 mg take 4 each am x 2 days, 2 each am x 2 days, 1 each am x 2 days and stop (this is to eliminate allergies and inflammation from coughing)  Protonix (pantoprazole) Take 30-60 min before first meal of the day and Pepcid 20 mg one bedtime plus chlorpheniramine 4 mg x 2 at bedtime (both available over the counter) And up to 4 hours during the day for sense of drainage.   Pt states that coughing is much improved-- Pt is no longer taking Tylenol #4, has finished this. Plans to finish Pred, Stay on Gabapentin, Stay on Allergy --Chloropheniramine, Stay on Pepcid and Delsym as needed.  Still following Reflux Diet. Pt would like to know if this is an okay action plan or if MW feels anything further is needed to be done at this time.  Pt has appt with MW 12/23/13 @ 3:30  Please advise Dr Sherene Sires. Thanks.

## 2013-12-13 NOTE — Telephone Encounter (Signed)
Looks good

## 2013-12-13 NOTE — Telephone Encounter (Signed)
Pt is aware that MW is good with action plan. Nothing further is needed at this time.

## 2013-12-23 ENCOUNTER — Encounter: Payer: Self-pay | Admitting: Internal Medicine

## 2013-12-23 ENCOUNTER — Ambulatory Visit (INDEPENDENT_AMBULATORY_CARE_PROVIDER_SITE_OTHER): Payer: BC Managed Care – PPO | Admitting: Internal Medicine

## 2013-12-23 VITALS — BP 136/78 | HR 98 | Temp 98.4°F | Ht 64.0 in | Wt 151.0 lb

## 2013-12-23 DIAGNOSIS — R05 Cough: Secondary | ICD-10-CM

## 2013-12-23 DIAGNOSIS — R059 Cough, unspecified: Secondary | ICD-10-CM

## 2013-12-23 NOTE — Progress Notes (Signed)
Subjective:    Patient ID: Alexandria Rush, female    DOB: 01/30/1951  MRN: [id number]    Brief patient profile:  [age] yowf never smoker with onset of recurrent ear aches in 20's and in  [facility] [place] allergy [place] and seemed some better x 10 years of rx and they didn't seem to flare when [place] them [place]  But also in 20s noted tendency to bad uri's going to chest typcially around xmas and fall which did not get better on shots - started inhaler in 30s but only needed intermittently [place] much worse since around 2010  with more severe, more frequent episodes but no need for inhaler between flares and attributed to sinus problems so underwent [facility] by worse cough [facility] > no change in rx as recovered completely again with no need for maint rx  but much worse since Winter of 2015 when dx'd with hooping cough > rx  dulera and singulair no better so referred 12/02/2013 by Dr Foy GuadalajaraFried for pulmonary eval.  History of Present Illness  12/02/2013 1st  Pulmonary office visit/ Parke Jandreau  Chief Complaint  Patient presents with  . Pulmonary Consult    Referred per Dr. Marinda Elkobert Fried. Pt c/o cough and SOB since Feb 2015.  She states that she is SOB "all of the time" with or without exertion. Cough is non prod at this time. She did have prod cough 1 wk ago with "white paste" looking sputum.   cough came on abruptly in Feb 2015  Assoc with change in voice and seems worse around 2 h after getting up then around 3-5 pm min productive until vomits. Only sob with cough rec The key to effective treatment for your cough is eliminating the non-stop cycle of cough   First take delsym two tsp every 12 hours and supplement if needed with  tramadol 50 mg up to 2 every 4 hours to suppress the urge to cough at all or even clear your throat.   Once you have eliminated the cough for 3 straight days try reducing the tramadol first,  then  the delsym as tolerated.   Prednisone 10 mg take  4 each am x 2 days,   2 each am x 2 days,  1 each am x 2 days and stop (this is to eliminate allergies and inflammation from coughing) Protonix (pantoprazole) Take 30-60 min before first meal of the day and Pepcid 20 mg one bedtime plus chlorpheniramine 4 mg x 2 at bedtime (both available over the counter)  until cough is completely gone for at least a week without the need for cough suppression GERD diet     12/08/2013 f/u ov/Andie Mungin re:  Only 50% reduction on  while on tramadol but has over 20/40 left because "I never needed it at night" Chief Complaint  Patient presents with  . Acute Visit    Pt c/o PND, nonprod cough worse with exertion X3 months.       Kouffman Reflux v Neurogenic Cough Differentiator Reflux Comments  Do you awaken from a sound sleep coughing violently?                            With trouble breathing? no   Do you have choking episodes when you cannot  Get enough air, gasping for air ?  Yes, still   Do you usually cough when you lie down into  The bed, or when you just lie down to rest ?                          no   Do you usually cough after meals or eating?         Not really    Do you cough when (or after) you bend over?    No   GERD SCORE     Kouffman Reflux v Neurogenic Cough Differentiator Neurogenic   Do you more-or-less cough all day long? yes   Does change of temperature make you cough? no   Does laughing or chuckling cause you to cough? yes   Do fumes (perfume, automobile fumes, burned  Toast, etc.,) cause you to cough ?      Not aware   Does speaking, singing, or talking on the phone cause you to cough   ?               yes   Neurogenic/Airway score    rec First take delsym two tsp every 12 hours and supplement if needed with  Tylenol #4  1 every 4 hours  to suppress the urge to cough at all or even clear your throat. Swallowing water or using ice chips/non mint and menthol containing candies  (such as lifesavers or sugarless jolly ranchers) are also effective.  You should rest your voice and avoid activities that you know make you cough. Once you have eliminated the cough for 3 straight days try reducing the Tylenol #4 first,  then the delsym as tolerated.   Neurontin 100 mg three times a day as a new maintain Prednisone 10 mg take  4 each am x 2 days,   2 each am x 2 days,  1 each am x 2 days and stop (this is to eliminate allergies and inflammation from coughing) Protonix (pantoprazole) Take 30-60 min before first meal of the day and Pepcid 20 mg one bedtime plus chlorpheniramine 4 mg x 2 at bedtime (both available over the counter)  And up to 4 hours during the day for sense of drainage. only use your albuterol as a rescue medication   12/23/2013 f/u ov/Lavinia Mcneely re: chronic cough/ recurrent pattern this one since Winter 2015  Chief Complaint  Patient presents with  . Follow-up    Cough almost completely resolved, no other complaints.   off tylenol # 4 since May 26  neurontin 100 mg one or two daily at most  Not limited by breathing from desired activities    No obvious other patterns in day to day or daytime variabilty or assoc sob or cp or chest tightness, subjective wheeze overt sinus or hb symptoms. No unusual exp hx or h/o childhood pna/ asthma or knowledge of premature birth.  Sleeping ok without nocturnal  or early am exacerbation  of respiratory  c/o's or need for noct saba. Also denies any obvious fluctuation of symptoms with weather or environmental changes or other aggravating or alleviating factors except as outlined above   Current Medications, Allergies, Complete Past Medical History, Past Surgical History, Family History, and Social History were reviewed in [employer] record.  ROS  The following are not active complaints unless bolded sore throat, dysphagia, dental problems, itching, sneezing,  nasal congestion or excess/ purulent secretions, ear  ache,   fever, chills, sweats, unintended wt loss, pleuritic  or exertional cp, hemoptysis,  orthopnea pnd or leg swelling, presyncope, palpitations, heartburn, abdominal pain, anorexia, nausea, vomiting, diarrhea  or change in bowel or urinary habits, change in stools or urine, dysuria,hematuria,  rash, arthralgias, visual complaints, headache, numbness weakness or ataxia or problems with walking or coordination,  change in mood/affect or memory.     .                Objective:   Physical Exam  amb wf no longer  cough or throat clearing   12/08/2013       150 > 12/23/2013  151 Wt Readings from Last 3 Encounters:  12/02/13 154 lb (69.854 kg)  11/23/13 150 lb (68.04 kg)  12/26/12 140 lb (63.504 kg)      HEENT: nl dentition, turbinates, and orophanx. Nl external ear canals without cough reflex   NECK :  without JVD/Nodes/TM/ nl carotid upstrokes bilaterally   LUNGS: no acc muscle use, clear to A and P bilaterally without cough on insp or exp maneuvers   CV:  RRR  no s3 or murmur or increase in P2, no edema   ABD:  soft and nontender with nl excursion in the supine position. No bruits or organomegaly, bowel sounds nl  MS:  warm without deformities, calf tenderness, cyanosis or clubbing  SKIN: warm and dry without lesions    NEURO:  alert, approp, no deficits     cxr 12/02/13  There is no edema or consolidation. The heart size and pulmonary vascularity are normal. No adenopathy. There is lower thoracic levoscoliosis      Assessment & Plan:

## 2013-12-23 NOTE — Patient Instructions (Addendum)
No change in medications x 6 weeks  If throat drainage continues and no better with chloretrimeton or zyrtec otc, then ENT eval next  Neurontin 100 mg three times a day   Please schedule a follow up office visit in 6 weeks, call sooner if needed with pfts

## 2013-12-25 NOTE — Assessment & Plan Note (Signed)
-   cyclical cough regimen initiated 12/03/2013 > failed to suppress on tramadol 2 q4 but only used 20 in 6 days (averaging a little over 3 a day) - redo cyclical cough with tylenol #4 12/08/2013  And added neurontin 100 tid but did not take consistently  - 12/08/13 Allergy Profile > IgE 12.7 with slt pos Dust titers on RAST, no Eos  Reviewed with pt  The standardized cough guidelines published in Chest by [place] in 2006 are still the best available and consist of a multiple step process (up to 12!) , not a single office visit,  and are intended  to address this problem logically,  with an alogrithm dependent on response to empiric treatment at  each progressive step  to determine a specific diagnosis with  minimal addtional testing needed. Therefore if adherence is an issue or can't be accurately verified,  it's very unlikely the standard evaluation and treatment will be successful here.    Furthermore, response to therapy (other than acute cough suppression, which should only be used short term with avoidance of narcotic containing cough syrups if possible), can be a gradual process for which the patient may perceive immediate benefit.  Unlike going to an eye doctor where the best perscription is almost always the first one and is immediately effective, this is almost never the case in the management of chronic cough syndromes. Therefore the patient needs to commit up front to consistently adhere to recommendations  for up to 6 weeks of therapy directed at the likely underlying problem(s) before the response can be reasonably evaluated.   Needs to take present meds long enough to judge true response before any additional w/u  See instructions for specific recommendations which were reviewed directly with the patient who was given a copy with highlighter outlining the key components.

## 2013-12-30 ENCOUNTER — Institutional Professional Consult (permissible substitution): Payer: BC Managed Care – PPO | Admitting: Critical Care Medicine

## 2014-02-03 ENCOUNTER — Ambulatory Visit: Payer: BC Managed Care – PPO | Admitting: Internal Medicine

## 2014-05-02 ENCOUNTER — Other Ambulatory Visit: Payer: Self-pay | Admitting: Gastroenterology

## 2014-05-02 DIAGNOSIS — R1031 Right lower quadrant pain: Secondary | ICD-10-CM

## 2014-05-05 ENCOUNTER — Ambulatory Visit
Admission: RE | Admit: 2014-05-05 | Discharge: 2014-05-05 | Disposition: A | Discharge status: Home / Self Care | Payer: BC Managed Care – PPO | Source: Ambulatory Visit | Attending: Gastroenterology | Admitting: Gastroenterology

## 2014-05-05 DIAGNOSIS — R1031 Right lower quadrant pain: Secondary | ICD-10-CM

## 2014-05-05 MED ORDER — IOHEXOL 300 MG/ML  SOLN
100.0000 mL | Freq: Once | INTRAMUSCULAR | Status: AC | PRN
  Administered 2014-05-05: 100 mL via INTRAVENOUS

## 2014-05-22 ENCOUNTER — Encounter: Payer: Self-pay | Admitting: Internal Medicine

## 2015-11-30 ENCOUNTER — Encounter: Payer: Self-pay | Admitting: Gynecology

## 2015-12-11 ENCOUNTER — Ambulatory Visit (INDEPENDENT_AMBULATORY_CARE_PROVIDER_SITE_OTHER): Payer: BC Managed Care – PPO | Admitting: Gynecology

## 2015-12-11 ENCOUNTER — Encounter: Payer: Self-pay | Admitting: Gynecology

## 2015-12-11 VITALS — BP 124/78 | Ht 63.0 in | Wt 153.0 lb

## 2015-12-11 DIAGNOSIS — R350 Frequency of micturition: Secondary | ICD-10-CM

## 2015-12-11 DIAGNOSIS — R102 Pelvic and perineal pain: Secondary | ICD-10-CM

## 2015-12-11 DIAGNOSIS — N3001 Acute cystitis with hematuria: Secondary | ICD-10-CM

## 2015-12-11 DIAGNOSIS — R300 Dysuria: Secondary | ICD-10-CM

## 2015-12-11 DIAGNOSIS — IMO0001 Reserved for inherently not codable concepts without codable children: Secondary | ICD-10-CM

## 2015-12-11 DIAGNOSIS — Z780 Asymptomatic menopausal state: Secondary | ICD-10-CM

## 2015-12-11 DIAGNOSIS — Z01419 Encounter for gynecological examination (general) (routine) without abnormal findings: Secondary | ICD-10-CM

## 2015-12-11 LAB — URINALYSIS W MICROSCOPIC + REFLEX CULTURE
Bilirubin Urine: NEGATIVE
Casts: NONE SEEN [LPF]
Crystals: NONE SEEN [HPF]
Glucose, UA: NEGATIVE
KETONES UR: NEGATIVE
NITRITE: NEGATIVE
PROTEIN: NEGATIVE
Specific Gravity, Urine: 1.02 (ref 1.001–1.035)
Yeast: NONE SEEN [HPF]
pH: 6 (ref 5.0–8.0)

## 2015-12-11 MED ORDER — NITROFURANTOIN MONOHYD MACRO 100 MG PO CAPS: 100.0000 mg | ORAL_CAPSULE | Freq: Two times a day (BID) | ORAL | Status: DC

## 2015-12-11 MED ORDER — PHENAZOPYRIDINE HCL 200 MG PO TABS: 200.0000 mg | ORAL_TABLET | Freq: Three times a day (TID) | ORAL | Status: DC | PRN

## 2015-12-11 NOTE — Addendum Note (Signed)
Addended by: Dayna BarkerGARDNER, Allannah Kempen K on: 12/11/2015 10:57 AM   Modules accepted: Orders

## 2015-12-11 NOTE — Addendum Note (Signed)
Addended by: Dayna BarkerGARDNER, Rich Paprocki K on: 12/11/2015 10:54 AM   Modules accepted: Orders

## 2015-12-11 NOTE — Patient Instructions (Signed)
Nitrofurantoin tablets or capsules What is this medicine? NITROFURANTOIN (nye troe fyoor AN toyn) is an antibiotic. It is used to treat urinary tract infections. This medicine may be used for other purposes; ask your health care provider or pharmacist if you have questions. What should I tell my health care provider before I take this medicine? They need to know if you have any of these conditions: -anemia -diabetes -glucose-6-phosphate dehydrogenase deficiency -kidney disease -liver disease -lung disease -other chronic illness -an unusual or allergic reaction to nitrofurantoin, other antibiotics, other medicines, foods, dyes or preservatives -pregnant or trying to get pregnant -breast-feeding How should I use this medicine? Take this medicine by mouth with a glass of water. Follow the directions on the prescription label. Take this medicine with food or milk. Take your doses at regular intervals. Do not take your medicine more often than directed. Do not stop taking except on your doctor's advice. Talk to your pediatrician regarding the use of this medicine in children. While this drug may be prescribed for selected conditions, precautions do apply. Overdosage: If you think you have taken too much of this medicine contact a poison control center or emergency room at once. NOTE: This medicine is only for you. Do not share this medicine with others. What if I miss a dose? If you miss a dose, take it as soon as you can. If it is almost time for your next dose, take only that dose. Do not take double or extra doses. What may interact with this medicine? -antacids containing magnesium trisilicate -probenecid -quinolone antibiotics like ciprofloxacin, lomefloxacin, norfloxacin and ofloxacin -sulfinpyrazone This list may not describe all possible interactions. Give your health care provider a list of all the medicines, herbs, non-prescription drugs, or dietary supplements you use. Also tell  them if you smoke, drink alcohol, or use illegal drugs. Some items may interact with your medicine. What should I watch for while using this medicine? Tell your doctor or health care professional if your symptoms do not improve or if you get new symptoms. Drink several glasses of water a day. If you are taking this medicine for a long time, visit your doctor for regular checks on your progress. If you are diabetic, you may get a false positive result for sugar in your urine with certain brands of urine tests. Check with your doctor. What side effects may I notice from receiving this medicine? Side effects that you should report to your doctor or health care professional as soon as possible: -allergic reactions like skin rash or hives, swelling of the face, lips, or tongue -chest pain -cough -difficulty breathing -dizziness, drowsiness -fever or infection -joint aches or pains -pale or blue-tinted skin -redness, blistering, peeling or loosening of the skin, including inside the mouth -tingling, burning, pain, or numbness in hands or feet -unusual bleeding or bruising -unusually weak or tired -yellowing of eyes or skin Side effects that usually do not require medical attention (report to your doctor or health care professional if they continue or are bothersome): -dark urine -diarrhea -headache -loss of appetite -nausea or vomiting -temporary hair loss This list may not describe all possible side effects. Call your doctor for medical advice about side effects. You may report side effects to FDA at 1-800-FDA-1088. Where should I keep my medicine? Keep out of the reach of children. Store at room temperature between 15 and 30 degrees C (59 and 86 degrees F). Protect from light. Throw away any unused medicine after the expiration date. NOTE:   This sheet is a summary. It may not cover all possible information. If you have questions about this medicine, talk to your doctor, pharmacist, or health  care provider.    2016, Elsevier/Gold Standard. (2008-01-26 15:56:47) Urinary Tract Infection Urinary tract infections (UTIs) can develop anywhere along your urinary tract. Your urinary tract is your body's drainage system for removing wastes and extra water. Your urinary tract includes two kidneys, two ureters, a bladder, and a urethra. Your kidneys are a pair of bean-shaped organs. Each kidney is about the size of your fist. They are located below your ribs, one on each side of your spine. CAUSES Infections are caused by microbes, which are microscopic organisms, including fungi, viruses, and bacteria. These organisms are so small that they can only be seen through a microscope. Bacteria are the microbes that most commonly cause UTIs. SYMPTOMS  Symptoms of UTIs may vary by age and gender of the patient and by the location of the infection. Symptoms in young women typically include a frequent and intense urge to urinate and a painful, burning feeling in the bladder or urethra during urination. Older women and men are more likely to be tired, shaky, and weak and have muscle aches and abdominal pain. A fever may mean the infection is in your kidneys. Other symptoms of a kidney infection include pain in your back or sides below the ribs, nausea, and vomiting. DIAGNOSIS To diagnose a UTI, your caregiver will ask you about your symptoms. Your caregiver will also ask you to provide a urine sample. The urine sample will be tested for bacteria and white blood cells. White blood cells are made by your body to help fight infection. TREATMENT  Typically, UTIs can be treated with medication. Because most UTIs are caused by a bacterial infection, they usually can be treated with the use of antibiotics. The choice of antibiotic and length of treatment depend on your symptoms and the type of bacteria causing your infection. HOME CARE INSTRUCTIONS  If you were prescribed antibiotics, take them exactly as your  caregiver instructs you. Finish the medication even if you feel better after you have only taken some of the medication.  Drink enough water and fluids to keep your urine clear or pale yellow.  Avoid caffeine, tea, and carbonated beverages. They tend to irritate your bladder.  Empty your bladder often. Avoid holding urine for long periods of time.  Empty your bladder before and after sexual intercourse.  After a bowel movement, women should cleanse from front to back. Use each tissue only once. SEEK MEDICAL CARE IF:   You have back pain.  You develop a fever.  Your symptoms do not begin to resolve within 3 days. SEEK IMMEDIATE MEDICAL CARE IF:   You have severe back pain or lower abdominal pain.  You develop chills.  You have nausea or vomiting.  You have continued burning or discomfort with urination. MAKE SURE YOU:   Understand these instructions.  Will watch your condition.  Will get help right away if you are not doing well or get worse.   This information is not intended to replace advice given to you by your health care provider. Make sure you discuss any questions you have with your health care provider.   Document Released: 04/16/2005 Document Revised: 03/28/2015 Document Reviewed: 08/15/2011 Elsevier Interactive Patient Education 2016 Elsevier Inc.  

## 2015-12-11 NOTE — Progress Notes (Signed)
DARCELL YACOUB 09-Jun-1951 [id number]   History:    65 y.o.  for annual gyn exam who has not been seen in the office since 2015. She was complaining of dysuria, frequency. She also has had on and off right lower quadrant discomfort. Ultrasound during that time had demonstrated a small calcified ovary and she had a normal CA 125. She has no dyspareunia. She's never been on any hormone replacement therapy. She reports normal colonoscopy in 2015. Patient has not had a bone density study as of yet. Her PCP is at [facility] family practice would've been doing her blood work.  Past medical history,surgical history, family history and social history were all reviewed and documented in the EPIC chart.  Gynecologic History No LMP recorded. Patient is postmenopausal. Contraception: post menopausal status Last Pap: Over 5 years ago. Results were: normal Last mammogram: 2013. Results were: normal  Obstetric History OB History  Gravida Para Term Preterm AB SAB TAB Ectopic Multiple Living  # Outcome Date GA Lbr Len/2nd Weight Sex Delivery Anes PTL Lv  2 Para           1 Para                ROS: A ROS was performed and pertinent positives and negatives are included in the history.  GENERAL: No fevers or chills. HEENT: No change in vision, no earache, sore throat or sinus congestion. NECK: No pain or stiffness. CARDIOVASCULAR: No chest pain or pressure. No palpitations. PULMONARY: No shortness of breath, cough or wheeze. GASTROINTESTINAL: No abdominal pain, nausea, vomiting or diarrhea, melena or bright red blood per rectum. GENITOURINARY: No urinary frequency, urgency, hesitancy or dysuria. MUSCULOSKELETAL: No joint or muscle pain, no back pain, no recent trauma. DERMATOLOGIC: No rash, no itching, no lesions. ENDOCRINE: No polyuria, polydipsia, no heat or cold intolerance. No recent change in weight. HEMATOLOGICAL: No anemia or easy bruising or bleeding. NEUROLOGIC: No headache, seizures,  numbness, tingling or weakness. PSYCHIATRIC: No depression, no loss of interest in normal activity or change in sleep pattern.     Exam: chaperone present  BP 124/78 mmHg  Ht  (1.6 m)  Wt 153 lb (69.4 kg)  BMI 27.11 kg/m2  Body mass index is 27.11 kg/(m^2).  General appearance : Well developed well nourished female. No acute distress HEENT: Eyes: no retinal hemorrhage or exudates,  Neck supple, trachea midline, no carotid bruits, no thyroidmegaly Lungs: Clear to auscultation, no rhonchi or wheezes, or rib retractions  Heart: Regular rate and rhythm, no murmurs or gallops Breast:Examined in sitting and supine position were symmetrical in appearance, no palpable masses or tenderness,  no skin retraction, no nipple inversion, no nipple discharge, no skin discoloration, no axillary or supraclavicular lymphadenopathy Abdomen: no palpable masses or tenderness, no rebound or guarding Extremities: no edema or skin discoloration or tenderness  Pelvic:  Bartholin, Urethra, Skene Glands: Within normal limits             Vagina: No gross lesions or discharge, atrophic changes  Cervix: No gross lesions or discharge  Uterus  anteverted, normal size, shape and consistency, non-tender and mobile  Adnexa  Without masses or tenderness  Anus and perineum  normal   Rectovaginal  normal sphincter tone without palpated masses or tenderness             Hemoccult PCP provides   Urinalysis: 3-10 RBC, bacteria moderate  Assessment/Plan:  65 y.o. female for annual exam with clinical evidence of urinary tract infection will be treated with Macrobid one by mouth twice a day for 7 days as well as Pyridium 200 mg one by mouth 3 times a day for 3 days. Patient to schedule her mammogram and her bone density study. Because of patient's on and off right lower quadrant discomfort and previously seen calcified ovary we are going to schedule an ultrasound the next couple weeks. She was encouraged to take her calcium  vitamin D for osteoporosis prevention as well as engage in weightbearing exercises 3 times a week. Pap smear with high-risk HPV screening obtained today as well.   Ok EdwardsFERNANDEZ,Dierre Crevier H MD, 10:44 AM 12/11/2015

## 2015-12-13 ENCOUNTER — Other Ambulatory Visit: Payer: Self-pay | Admitting: Gynecology

## 2015-12-13 DIAGNOSIS — R8271 Bacteriuria: Secondary | ICD-10-CM

## 2015-12-13 DIAGNOSIS — R3129 Other microscopic hematuria: Secondary | ICD-10-CM

## 2015-12-13 LAB — URINE CULTURE

## 2015-12-13 LAB — PAP, TP IMAGING W/ HPV RNA, RFLX HPV TYPE 16,18/45: HPV MRNA, HIGH RISK: NOT DETECTED

## 2015-12-21 ENCOUNTER — Ambulatory Visit (INDEPENDENT_AMBULATORY_CARE_PROVIDER_SITE_OTHER): Payer: BC Managed Care – PPO

## 2015-12-21 ENCOUNTER — Ambulatory Visit (INDEPENDENT_AMBULATORY_CARE_PROVIDER_SITE_OTHER): Payer: BC Managed Care – PPO | Admitting: Gynecology

## 2015-12-21 ENCOUNTER — Encounter: Payer: Self-pay | Admitting: Gynecology

## 2015-12-21 VITALS — BP 130/88

## 2015-12-21 DIAGNOSIS — N83201 Unspecified ovarian cyst, right side: Secondary | ICD-10-CM | POA: Diagnosis not present

## 2015-12-21 DIAGNOSIS — R102 Pelvic and perineal pain: Secondary | ICD-10-CM

## 2015-12-21 DIAGNOSIS — Z87440 Personal history of urinary (tract) infections: Secondary | ICD-10-CM

## 2015-12-21 LAB — URINALYSIS W MICROSCOPIC + REFLEX CULTURE
Bilirubin Urine: NEGATIVE
Casts: NONE SEEN [LPF]
Crystals: NONE SEEN [HPF]
Glucose, UA: NEGATIVE
Ketones, ur: NEGATIVE
NITRITE: NEGATIVE
PH: 5.5 (ref 5.0–8.0)
SPECIFIC GRAVITY, URINE: 1.025 (ref 1.001–1.035)
Yeast: NONE SEEN [HPF]

## 2015-12-21 NOTE — Progress Notes (Signed)
   Patient is a 65 year old was seen the office for her annual exam on May 23 she was complaining of dysuria and frequency and her urinalysis had demonstrated 3-10 RBC, moderate bacteria. She was started on Macrobid one by mouth twice a day for 7 days and Pyridium 2 mg 3 times a day for 3 days for her discomfort. Her urine culture came back mixed bacterial flora. She is here for follow-up urinalysis as well as for follow-up ultrasound because she had not been seen the office prior to that since 2015 and at which time an ultrasound had demonstrated a small calcified ovary and she also had a normal CA 125.  Ultrasound today: Uterus measures 6.4 x 4.7 x 2.7 cm with endometrial stripe of 1.2 mm a right solid calcification 7 x 5 mm was noted previous solid focus from 2015 was not seen and today's exam. Left ovary was normal. Both ovaries atrophic. No apparent adnexal masses and no fluid in the cul-de-sac.  Urinalysis today 6-10 WBC moderate bacteria no yeast culture pending  Assessment/plan benign smaller calcified area of the right ovary otherwise normal ultrasound normal endometrial stripe patient no hormone replacement therapy. Urine culture pending at time of this dictation. Patient otherwise scheduled to return back to the office in one year or when necessary.

## 2015-12-22 LAB — URINE CULTURE

## 2016-07-21 DIAGNOSIS — S82209A Unspecified fracture of shaft of unspecified tibia, initial encounter for closed fracture: Secondary | ICD-10-CM

## 2016-07-21 HISTORY — DX: Unspecified fracture of shaft of unspecified tibia, initial encounter for closed fracture: S82.209A

## 2016-12-03 ENCOUNTER — Encounter: Payer: Self-pay | Admitting: Gynecology

## 2017-07-21 HISTORY — PX: COLONOSCOPY: SHX174

## 2017-07-21 HISTORY — PX: RIB FRACTURE SURGERY: SHX2358

## 2017-08-27 DIAGNOSIS — S2239XA Fracture of one rib, unspecified side, initial encounter for closed fracture: Secondary | ICD-10-CM

## 2017-08-27 HISTORY — DX: Fracture of one rib, unspecified side, initial encounter for closed fracture: S22.39XA

## 2017-08-31 ENCOUNTER — Other Ambulatory Visit: Payer: Self-pay | Admitting: Orthopedic Surgery

## 2017-08-31 DIAGNOSIS — R0781 Pleurodynia: Secondary | ICD-10-CM

## 2017-09-05 ENCOUNTER — Ambulatory Visit
Admission: RE | Admit: 2017-09-05 | Discharge: 2017-09-05 | Disposition: A | Discharge status: Home / Self Care | Payer: Medicare Other | Source: Ambulatory Visit | Attending: Orthopedic Surgery | Admitting: Orthopedic Surgery

## 2017-09-05 DIAGNOSIS — R0781 Pleurodynia: Secondary | ICD-10-CM

## 2017-09-05 MED ORDER — IOPAMIDOL (ISOVUE-300) INJECTION 61%
100.0000 mL | Freq: Once | INTRAVENOUS | Status: AC | PRN
  Administered 2017-09-05: 100 mL via INTRAVENOUS

## 2017-11-02 LAB — BASIC METABOLIC PANEL
BUN: 19 (ref 4–21)
Creatinine: 0.8 (ref <=1.1)
Glucose: 79
Potassium: 4.7 (ref 3.4–5.3)
Sodium: 143 (ref 137–147)

## 2017-11-02 LAB — HEPATIC FUNCTION PANEL
ALT: 12 (ref 7–35)
AST: 14 (ref 13–35)
Bilirubin, Total: 0.3

## 2017-11-02 LAB — CBC AND DIFFERENTIAL
HEMATOCRIT: 40 (ref 36–46)
Hemoglobin: 13.2 (ref 12.0–16.0)
PLATELETS: 239 (ref 150–399)
WBC: 4.7

## 2017-11-05 LAB — HM COLONOSCOPY

## 2018-01-14 LAB — LIPID PANEL
Cholesterol: 211 — AB (ref 0–200)
HDL: 51 (ref 35–70)
LDL Cholesterol: 132
LDl/HDL Ratio: 4.2
Triglycerides: 140 (ref 40–160)

## 2018-01-14 LAB — BASIC METABOLIC PANEL
BUN: 14 (ref 4–21)
Creatinine: 0.7 (ref <=1.1)
Glucose: 75
Potassium: 4.5 (ref 3.4–5.3)
Sodium: 140 (ref 137–147)

## 2018-01-14 LAB — CBC AND DIFFERENTIAL
Hemoglobin: 13.1 (ref 12.0–16.0)
WBC: 4

## 2018-04-13 ENCOUNTER — Encounter (HOSPITAL_BASED_OUTPATIENT_CLINIC_OR_DEPARTMENT_OTHER): Payer: Self-pay | Admitting: THORACIC SURGERY CARDIOTHORACIC VASCULAR SURGERY

## 2018-04-13 ENCOUNTER — Ambulatory Visit
Payer: 59 | Attending: THORACIC SURGERY CARDIOTHORACIC VASCULAR SURGERY | Admitting: THORACIC SURGERY CARDIOTHORACIC VASCULAR SURGERY

## 2018-04-13 VITALS — BP 164/80 | HR 77 | Temp 97.6°F | Resp 18 | Ht 63.0 in | Wt 150.0 lb

## 2018-04-13 DIAGNOSIS — M954 Acquired deformity of chest and rib: Secondary | ICD-10-CM

## 2018-04-13 DIAGNOSIS — S2242XA Multiple fractures of ribs, left side, initial encounter for closed fracture: Principal | ICD-10-CM

## 2018-04-13 NOTE — H&P (View-Only) (Signed)
[facility]        Initial Consult Note     Name: Anita CutterDebra Sevey  MRN: [id number]  Date of visit: 04/13/2018     HPI  Patient is a 67 y.o. female who was seen today for left rib pain.  It is located in her left costal margin and radiates in a stripe posteriorly to her back.  It has been present for one year and occurs multiple times daily.  It began when she had a broken leg and was on crutches.  She fell onto the crutch striking her ribs hard.  She was found at the time to have left 6-10th rib fractures.  Ibuprofen and rest minimally improves it.  It lasts for hours and occurs when she does any activities, such as pulling weeds.  It is described by the patient as severe pain.  Her husband states that she is often unable to do anything because of the pain.  She denies depression or suicidal ideations due to the pain.  Workup by her PCP, an Orthopedic surgeon, [place] traveled today[place] has revealed nothing except healed 6-10th left rib fractures.  She has had 2 CTs and rib films.  I reviewed the images from her chest and abdomen CT scans and note a flared anterior left 9th rib and a separation of the tip of the left 10th rib cartilage.    There are no active problems to display for this patient.    Past Medical History:   Diagnosis Date   . Known health problems: none           Past Surgical History:   Procedure Laterality Date   . HX APPENDECTOMY     . HX TONSILLECTOMY     . SINUS SURGERY            No current outpatient medications on file.     Allergies   Allergen Reactions   . Mycinette [Cetylpyridinium-Benzocaine] Anaphylaxis   . Penicillins Anaphylaxis   . Dairy Aid [Lactase]       Social History     Occupational History   . Not on file     Social History     Tobacco Use   . Smoking status: Never Smoker   . Smokeless tobacco: Never Used   Substance Use Topics   . Alcohol use: Yes     Frequency: Monthly or less     Drinks per session: 1 or 2     Binge  frequency: Less than monthly     Social History     Substance and Sexual Activity   Drug Use Never     Family Medical History:     Problem Relation (Age of Onset)    Cancer Mother    Other Father             Review of Systems  Other than ROS in the HPI, all other systems were negative.    Examination:    BP (!) 164/80   Pulse 77   Temp 36.4 C (97.6 F) (Tympanic)   Resp 18   Ht 1.6 m (5\' 3" )   Wt 68 kg (150 lb)   SpO2 99%   BMI 26.57 kg/m       General:  well nourished  Head:   Normocephalic, without obvious abnormality or trauma.  Eyes: Conjunctivae clear. Sclera non-icteric.  Neck:  supraclavicular lymph nodes are  nonpalpable   Lungs:  clear to auscultation  bilaterally   Chest:  Palpable deformity of the anterior 9th rib with anterior flaring.  Severe pain at 9th cartilage with palpation.  1 cm separation of the tip of the 10th rib from the costal arch with mobility of the 10th rib and pain to palpation.  Pain extends along 9th intercostal space to the spine.  Heart:  Regular rate and rhythm, S1, S2 normal, no murmur, click, rub or gallop  Vascular:  Carotid's without bruit. Upper and lower extremities with good pulses  Abdomen:  Soft, non-tender. Bowel sounds normal. No masses,  No organomegaly.  Extremities:  Extremities normal, atraumatic, no cyanosis or edema.   Neurologic:  CN II-XII intact. Normal strength, sensation and reflexes throughout.    Assessment:  1. Closed fracture of multiple ribs of left side, initial encounter    2. 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  In healing, she has developed an anteriorly displaced 9th rib deformity and this has caused cartilage fracture of the 10th rib from the 9th.  I believe she will benefit from 10th rib fracture stabilization and possible outfracture(s) of the 9th rib and reconstruction with an absorbable plate.  Explained that it is difficult to guarantee pain relief, but that my experience  shows reconstruction of proper rib anatomy usually results in relief of intercostal neuralgia.  She voiced understanding of the risk of persistent pain, increased pain, or need for additional procedures.     Plan:  No orders of the defined types were placed in this encounter.  Scheduled for left 10th rib fracture stabilization and possible outfracture(s) of the 9th rib and reconstruction with an absorbable plate.  Explained the further risks, including infection, bleeding, prolonged chest tube placement , post-operative arrythmia, cardiac complications, conversion to open, severe injury, pneumothorax, failure of repair, wound complication and nerve injury.      Janyth Contes, MD

## 2018-04-13 NOTE — H&P (Signed)
[facility]        Initial Consult Note     Name: Anita Vasquez  MRN: [id number]  Date of visit: 04/13/2018     HPI  Patient is a 67 y.o. female who was seen today for left rib pain.  It is located in her left costal margin and radiates in a stripe posteriorly to her back.  It has been present for one year and occurs multiple times daily.  It began when she had a broken leg and was on crutches.  She fell onto the crutch striking her ribs hard.  She was found at the time to have left 6-10th rib fractures.  Ibuprofen and rest minimally improves it.  It lasts for hours and occurs when she does any activities, such as pulling weeds.  It is described by the patient as severe pain.  Her husband states that she is often unable to do anything because of the pain.  She denies depression or suicidal ideations due to the pain.  Workup by her PCP, an Orthopedic surgeon, and a Thoracic surgeon in [place] (from which she has traveled today) has revealed nothing except healed 6-10th left rib fractures.  She has had 2 CTs and rib films.  I reviewed the images from her chest and abdomen CT scans and note a flared anterior left 9th rib and a separation of the tip of the left 10th rib cartilage.    There are no active problems to display for this patient.    Past Medical History:   Diagnosis Date   . Known health problems: none           Past Surgical History:   Procedure Laterality Date   . HX APPENDECTOMY     . HX TONSILLECTOMY     . SINUS SURGERY            No current outpatient medications on file.     Allergies   Allergen Reactions   . Mycinette [Cetylpyridinium-Benzocaine] Anaphylaxis   . Penicillins Anaphylaxis   . Dairy Aid [Lactase]       Social History     Occupational History   . Not on file     Social History     Tobacco Use   . Smoking status: Never Smoker   . Smokeless tobacco: Never Used   Substance Use Topics   . Alcohol use: Yes     Frequency: Monthly or less     Drinks per session: 1 or 2     Binge  frequency: Less than monthly     Social History     Substance and Sexual Activity   Drug Use Never     Family Medical History:     Problem Relation (Age of Onset)    Cancer Mother    Other Father             Review of Systems  Other than ROS in the HPI, all other systems were negative.    Examination:    BP (!) 164/80   Pulse 77   Temp 36.4 C (97.6 F) (Tympanic)   Resp 18   Ht 1.6 m (5' 3")   Wt 68 kg (150 lb)   SpO2 99%   BMI 26.57 kg/m       General:  well nourished  Head:   Normocephalic, without obvious abnormality or trauma.  Eyes: Conjunctivae clear. Sclera non-icteric.  Neck:  supraclavicular lymph nodes are  nonpalpable   Lungs:    clear to auscultation  bilaterally   Chest:  Palpable deformity of the anterior 9th rib with anterior flaring.  Severe pain at 9th cartilage with palpation.  1 cm separation of the tip of the 10th rib from the costal arch with mobility of the 10th rib and pain to palpation.  Pain extends along 9th intercostal space to the spine.  Heart:  Regular rate and rhythm, S1, S2 normal, no murmur, click, rub or gallop  Vascular:  Carotid's without bruit. Upper and lower extremities with good pulses  Abdomen:  Soft, non-tender. Bowel sounds normal. No masses,  No organomegaly.  Extremities:  Extremities normal, atraumatic, no cyanosis or edema.   Neurologic:  CN II-XII intact. Normal strength, sensation and reflexes throughout.    Assessment:  1. Closed fracture of multiple ribs of left side, initial encounter    2. 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  In healing, she has developed an anteriorly displaced 9th rib deformity and this has caused cartilage fracture of the 10th rib from the 9th.  I believe she will benefit from 10th rib fracture stabilization and possible outfracture(s) of the 9th rib and reconstruction with an absorbable plate.  Explained that it is difficult to guarantee pain relief, but that my experience  shows reconstruction of proper rib anatomy usually results in relief of intercostal neuralgia.  She voiced understanding of the risk of persistent pain, increased pain, or need for additional procedures.     Plan:  No orders of the defined types were placed in this encounter.  Scheduled for left 10th rib fracture stabilization and possible outfracture(s) of the 9th rib and reconstruction with an absorbable plate.  Explained the further risks, including infection, bleeding, prolonged chest tube placement , post-operative arrythmia, cardiac complications, conversion to open, severe injury, pneumothorax, failure of repair, wound complication and nerve injury.      Frenchie Pribyl J Tija Biss, MD

## 2018-04-19 ENCOUNTER — Encounter (HOSPITAL_COMMUNITY): Payer: Self-pay

## 2018-04-20 ENCOUNTER — Ambulatory Visit
Admission: RE | Admit: 2018-04-20 | Discharge: 2018-04-20 | Disposition: A | Discharge status: Home / Self Care | Payer: 59 | Source: Ambulatory Visit | Attending: THORACIC SURGERY CARDIOTHORACIC VASCULAR SURGERY | Admitting: THORACIC SURGERY CARDIOTHORACIC VASCULAR SURGERY

## 2018-04-20 ENCOUNTER — Ambulatory Visit (HOSPITAL_COMMUNITY): Payer: 59

## 2018-04-20 DIAGNOSIS — S2242XA Multiple fractures of ribs, left side, initial encounter for closed fracture: Secondary | ICD-10-CM

## 2018-04-20 DIAGNOSIS — M954 Acquired deformity of chest and rib: Secondary | ICD-10-CM

## 2018-04-20 LAB — CBC
HCT: 40.4 % (ref 34.6–46.2)
HGB: 13.4 g/dL (ref 11.8–15.8)
MCH: 32 pg (ref 27.6–33.2)
MCHC: 33.2 g/dL (ref 32.6–35.4)
MCV: 96.4 fL (ref 82.3–96.7)
MPV: 9.3 fL (ref 6.6–10.2)
PLATELETS: 228 10*3/uL (ref 140–440)
PLATELETS: 228 x10ˆ3/uL (ref 140–440)
RBC: 4.19 x10?6/uL (ref 3.80–5.24)
RBC: 4.19 x10ˆ6/uL (ref 3.80–5.24)
RDW: 13.9 % (ref 12.4–15.2)
WBC: 7.1 10*3/uL (ref 3.5–10.3)

## 2018-04-20 LAB — ECG 12 LEAD - ADULT
Calculated P Axis: 36 deg
Calculated T Axis: 54 deg
EKG Severity: BORDERLINE
Heart Rate: 66 {beats}/min
I 40 Axis: 61 deg
PR Interval: 137 ms
QRS Axis: 47 deg
QRS Duration: 108 ms
QT Interval: 402 ms
QTC Calculation: 422 ms
ST Axis: 133 deg
T 40 Axis: 72 deg

## 2018-04-20 LAB — BASIC METABOLIC PANEL
ANION GAP: 8 mmol/L
BUN/CREA RATIO: 17
BUN: 12 mg/dL (ref 10–25)
CALCIUM: 9.5 mg/dL (ref 8.8–10.3)
CHLORIDE: 107 mmol/L (ref 98–111)
CO2 TOTAL: 28 mmol/L (ref 21–35)
CREATININE: 0.7 mg/dL (ref <=1.30)
ESTIMATED GFR: >60 mL/min/1.73mˆ2
GLUCOSE: 80 mg/dL (ref 70–110)
POTASSIUM: 4.4 mmol/L (ref 3.5–5.0)
SODIUM: 143 mmol/L (ref 135–145)

## 2018-04-20 LAB — TYPE AND SCREEN
ABO/RH(D): O POS
ANTIBODY SCREEN: NEGATIVE
ANTIBODY SCREEN: NEGATIVE

## 2018-04-21 ENCOUNTER — Ambulatory Visit (HOSPITAL_BASED_OUTPATIENT_CLINIC_OR_DEPARTMENT_OTHER): Payer: Medicare PPO | Admitting: Certified Registered"

## 2018-04-21 ENCOUNTER — Inpatient Hospital Stay
Admission: RE | Admit: 2018-04-21 | Discharge: 2018-04-21 | Disposition: A | Discharge status: Home / Self Care | Payer: Medicare PPO | Source: Ambulatory Visit | Attending: THORACIC SURGERY CARDIOTHORACIC VASCULAR SURGERY | Admitting: THORACIC SURGERY CARDIOTHORACIC VASCULAR SURGERY

## 2018-04-21 ENCOUNTER — Encounter (HOSPITAL_COMMUNITY): Payer: Self-pay

## 2018-04-21 ENCOUNTER — Ambulatory Visit (HOSPITAL_COMMUNITY): Payer: Medicare PPO | Admitting: Certified Registered"

## 2018-04-21 ENCOUNTER — Encounter (HOSPITAL_COMMUNITY)
Admission: RE | Disposition: A | Discharge status: Home / Self Care | Payer: Self-pay | Source: Ambulatory Visit | Attending: THORACIC SURGERY CARDIOTHORACIC VASCULAR SURGERY

## 2018-04-21 DIAGNOSIS — Z87891 Personal history of nicotine dependence: Secondary | ICD-10-CM | POA: Insufficient documentation

## 2018-04-21 DIAGNOSIS — G8929 Other chronic pain: Secondary | ICD-10-CM

## 2018-04-21 DIAGNOSIS — M898X8 Other specified disorders of bone, other site: Secondary | ICD-10-CM

## 2018-04-21 DIAGNOSIS — S2242XA Multiple fractures of ribs, left side, initial encounter for closed fracture: Secondary | ICD-10-CM

## 2018-04-21 DIAGNOSIS — M954 Acquired deformity of chest and rib: Secondary | ICD-10-CM

## 2018-04-21 DIAGNOSIS — J45909 Unspecified asthma, uncomplicated: Secondary | ICD-10-CM | POA: Insufficient documentation

## 2018-04-21 HISTORY — DX: Unspecified asthma, uncomplicated: J45.909

## 2018-04-21 HISTORY — DX: Multiple fractures of ribs, unspecified side, initial encounter for closed fracture: S22.49XA

## 2018-04-21 SURGERY — RECONSTRUCTION CHEST WALL
Anesthesia: General | Site: Chest | Laterality: Left | Wound class: Clean Contaminated Wounds-The respiratory, GI, Genital, or urinary

## 2018-04-21 MED ORDER — NEOSTIGMINE METHYLSULFATE 1 MG/ML INTRAVENOUS SOLUTION
Freq: Once | INTRAVENOUS | Status: DC | PRN
Start: 2018-04-21 — End: 2018-04-21
  Administered 2018-04-21: 4 mg via INTRAVENOUS

## 2018-04-21 MED ORDER — GLYCOPYRROLATE 0.2 MG/ML INJECTION SOLUTION
Freq: Once | INTRAMUSCULAR | Status: DC | PRN
Start: 2018-04-21 — End: 2018-04-21
  Administered 2018-04-21: 0.8 mg via INTRAVENOUS

## 2018-04-21 MED ORDER — VANCOMYCIN 1G IN NS 250ML MINIBAG IVPB - 60 MIN INF
Freq: Once | INTRAVENOUS | Status: DC | PRN
Start: 2018-04-21 — End: 2018-04-21
  Administered 2018-04-21: 1 g via INTRAVENOUS

## 2018-04-21 MED ORDER — PROMETHAZINE 25 MG/ML INJECTION SOLUTION - IV
6.2500 mg | Freq: Once | INTRAMUSCULAR | Status: DC | PRN
Start: 2018-04-21 — End: 2018-04-21
  Administered 2018-04-21: 6.25 mg via INTRAVENOUS
  Filled 2018-04-21: qty 1

## 2018-04-21 MED ORDER — LIDOCAINE (PF) 100 MG/5 ML (2 %) INTRAVENOUS SYRINGE
INJECTION | Freq: Once | INTRAVENOUS | Status: DC | PRN
Start: 2018-04-21 — End: 2018-04-21
  Administered 2018-04-21 (×2): 60 mg via INTRAVENOUS

## 2018-04-21 MED ORDER — HYDROMORPHONE (PF) 0.5 MG/0.5 ML INJECTION SYRINGE
0.5000 mg | INJECTION | INTRAMUSCULAR | Status: AC | PRN
Start: 2018-04-21 — End: 2018-04-21
  Administered 2018-04-21 (×4): 0.5 mg via INTRAVENOUS
  Filled 2018-04-21 (×4): qty 0.5

## 2018-04-21 MED ORDER — WATER FOR IRRIGATION, STERILE SOLUTION
Freq: Once | Status: DC | PRN
Start: 2018-04-21 — End: 2018-04-21
  Administered 2018-04-21: 1000 mL

## 2018-04-21 MED ORDER — KETOROLAC 10 MG TABLET
10.00 mg | ORAL_TABLET | Freq: Three times a day (TID) | ORAL | Status: DC
Start: 2018-04-21 — End: 2018-04-21
  Filled 2018-04-21 (×3): qty 1

## 2018-04-21 MED ORDER — MEPERIDINE (PF) 25 MG/ML INJECTION SYRINGE
12.5000 mg | INJECTION | INTRAMUSCULAR | Status: DC | PRN
Start: 2018-04-21 — End: 2018-04-21

## 2018-04-21 MED ORDER — OXYCODONE-ACETAMINOPHEN 5 MG-325 MG TABLET
1.0000 | ORAL_TABLET | ORAL | 0 refills | Status: DC | PRN
Start: 2018-04-21 — End: 2021-02-21

## 2018-04-21 MED ORDER — LACTATED RINGERS INTRAVENOUS SOLUTION
INTRAVENOUS | Status: DC
Start: 2018-04-21 — End: 2018-04-21

## 2018-04-21 MED ORDER — DICLOFENAC POTASSIUM 50 MG TABLET
50.00 mg | ORAL_TABLET | Freq: Three times a day (TID) | ORAL | 0 refills | Status: AC
Start: 2018-04-21 — End: 2018-04-28

## 2018-04-21 MED ORDER — MIDAZOLAM 1 MG/ML INJECTION SOLUTION
Freq: Once | INTRAMUSCULAR | Status: DC | PRN
Start: 2018-04-21 — End: 2018-04-21
  Administered 2018-04-21: 2 mg via INTRAVENOUS

## 2018-04-21 MED ORDER — PROPOFOL 10 MG/ML IV BOLUS
INJECTION | Freq: Once | INTRAVENOUS | Status: DC | PRN
Start: 2018-04-21 — End: 2018-04-21
  Administered 2018-04-21: 200 mg via INTRAVENOUS

## 2018-04-21 MED ORDER — SODIUM CHLORIDE 0.9 % IRRIGATION SOLUTION
Freq: Once | Status: DC | PRN
Start: 2018-04-21 — End: 2018-04-21
  Administered 2018-04-21: 1000 mL

## 2018-04-21 MED ORDER — ROCURONIUM 50 MG/5 ML (10 MG/ML) INTRAVENOUS SYRINGE
INJECTION | Freq: Once | INTRAVENOUS | Status: DC | PRN
Start: 2018-04-21 — End: 2018-04-21
  Administered 2018-04-21: 50 mg via INTRAVENOUS

## 2018-04-21 MED ORDER — OXYCODONE-ACETAMINOPHEN 5 MG-325 MG TABLET
1.00 | ORAL_TABLET | ORAL | Status: DC | PRN
Start: 2018-04-21 — End: 2018-04-21

## 2018-04-21 MED ORDER — FENTANYL (PF) 50 MCG/ML INJECTION SOLUTION
25.0000 ug | INTRAMUSCULAR | Status: DC | PRN
Start: 2018-04-21 — End: 2018-04-21

## 2018-04-21 MED ORDER — ACETAMINOPHEN 1,000 MG/100 ML (10 MG/ML) INTRAVENOUS SOLUTION
Freq: Once | INTRAVENOUS | Status: DC | PRN
Start: 2018-04-21 — End: 2018-04-21
  Administered 2018-04-21: 1000 mg via INTRAVENOUS

## 2018-04-21 MED ORDER — ONDANSETRON HCL (PF) 4 MG/2 ML INJECTION SOLUTION
Freq: Once | INTRAMUSCULAR | Status: DC | PRN
Start: 2018-04-21 — End: 2018-04-21
  Administered 2018-04-21: 4 mg via INTRAVENOUS

## 2018-04-21 MED ORDER — BUPIVACAINE LIPOSOME(PF) 1.3 %(13.3 MG/ML) SUSPENSION FOR INFILTRATION
Freq: Once | Status: DC | PRN
Start: 2018-04-21 — End: 2018-04-21
  Administered 2018-04-21: 20 mL

## 2018-04-21 MED ORDER — FENTANYL (PF) 50 MCG/ML INJECTION SOLUTION
Freq: Once | INTRAMUSCULAR | Status: DC | PRN
Start: 2018-04-21 — End: 2018-04-21
  Administered 2018-04-21: 100 ug via INTRAVENOUS

## 2018-04-21 MED ORDER — KETOROLAC 30 MG/ML (1 ML) INJECTION SOLUTION
Freq: Once | INTRAMUSCULAR | Status: DC | PRN
Start: 2018-04-21 — End: 2018-04-21
  Administered 2018-04-21: 15 mg via INTRAVENOUS

## 2018-04-21 MED ORDER — ACETAMINOPHEN 1,000 MG/100 ML (10 MG/ML) INTRAVENOUS SOLUTION
INTRAVENOUS | Status: AC
Start: 2018-04-21 — End: 2018-04-21
  Filled 2018-04-21: qty 100

## 2018-04-21 MED ORDER — SODIUM CHLORIDE 0.9 % (FLUSH) INJECTION SYRINGE
3.0000 mL | INJECTION | INTRAMUSCULAR | Status: DC | PRN
Start: 2018-04-21 — End: 2018-04-21
  Administered 2018-04-21: 3 mL

## 2018-04-21 MED ORDER — BUPIVACAINE (PF) 0.5 % (5 MG/ML) INJECTION SOLUTION
Freq: Once | INTRAMUSCULAR | Status: DC | PRN
Start: 2018-04-21 — End: 2018-04-21
  Administered 2018-04-21: 20 mL

## 2018-04-21 MED ORDER — SODIUM CHLORIDE 0.9 % (FLUSH) INJECTION SYRINGE
3.0000 mL | INJECTION | Freq: Three times a day (TID) | INTRAMUSCULAR | Status: DC
Start: 2018-04-21 — End: 2018-04-21

## 2018-04-21 MED ADMIN — electrolyte-A intravenous solution: INTRAVENOUS | @ 11:00:00 | NDC 00338022104

## 2018-04-21 MED ADMIN — sodium chloride 0.9 % (flush) injection syringe: @ 08:00:00

## 2018-04-21 MED ADMIN — rocuronium 50 mg/5 mL (10 mg/mL) intravenous syringe: INTRAVENOUS | @ 09:00:00

## 2018-04-21 SURGICAL SUPPLY — 63 items
ADHESIVE TISSUE EXOFIN 1.0ML_PREMIERPRO EXOFIN (SEALANTS) ×1
AGENT HMST THRMB STRL KIT MATRIX SURGIFLO 8ML (SEALANTS) IMPLANT
AGENT HMST THRMB STRL KIT MATR_IX SURGIFLO 8ML (SEALANTS)
APPL 70% ISPRP 2% CHG 26ML 13._2X13.2IN CHLRPRP PREP DEHP-FR (WOUND CARE/ENTEROSTOMAL SUPPLY) ×1
APPL 70% ISPRP 2% CHG 26ML CHLRPRP HI-LT ORNG PREP STRL LF  DISP CLR (WOUND CARE SUPPLY) ×1 IMPLANT
APPL ENDOS 34CM SURGIFLO STRL LF  DISP (SEALANTS) IMPLANT
APPL ENDOS 34CM SURGIFLO STRL_LF DISP (SEALANTS)
CATH DRAIN 32FR 23IN ANG RGT T_PR 5 EYLT CONN THRMSNS D MRK (CARDIAC)
CATH DRAIN 32FR 23IN STR TAPER TIP KINK RST 6 EYE N-PYRG PVC STRL LF (CARDIAC) ×1 IMPLANT
CATH DRAIN HYDRAGLIDE 32FR 23IN RGT ANG 5 EYLT TAPER CONN TIP THRMSNS RADOPQ THOR PVC HDRPH PED STRL (CARDIAC) IMPLANT
CATH DRAIN HYDRAGLIDE 32FR 23I_N STR TPR 6 EYLT KINK RST (CARDIAC) ×1
CONV USE ITEM 156524 - ADHESIVE TISSUE EXOFIN 1.0ML_PREMIERPRO EXOFIN (SEALANTS) ×1 IMPLANT
CRADLE POSITION ARM FOAM LMI (SUPP) ×1
CRADLE POSITION DVN ARM FOAM LMI LF (SUPP) ×1 IMPLANT
DRAIN CHANNEL JP 28FR_80EA/CS JP2290 (Drains/Resovoirs)
DRAIN CHEST OASIS DRY SUCT_NEG 40CM CS/6 3600100 (Drains/Resovoirs) ×1
DRAIN INCS 28FR CHNL (Drains/Resovoirs) IMPLANT
DRAIN INCS ATR OAS PLASTIC CHST THOR TUBE DRY SUCT COLLECT CHAMBER STRL LF  DISP ATS BAG (Drains/Resovoirs) ×1 IMPLANT
DRAPE 2 INCS FILM ANTIMIC 23X17IN IOBN STRL SURG (PROTECTIVE PRODUCTS/GARMENTS) ×1 IMPLANT
DRAPE 2 INCS FILM ANTIMIC 23X1_7IN IOBN STRL SURG (PROTECTIVE PRODUCTS/GARMENTS) ×1
DRAPE ENDOSCP ABS IMPRV REINF ARMBRD CVR CORD HOLD TAB 38.5X16IN 11IN 10IN CNVRT LF  STRL SURG (PROTECTIVE PRODUCTS/GARMENTS) ×1 IMPLANT
DRAPE ENDOSCP ABS IMPRV REINF_ARMBRD CVR CORD HOLD TAB 38.5 (PROTECTIVE PRODUCTS/GARMENTS) ×1
DRESSING TELFA PAD 3X4IN ST 2132 100EA/BX (WOUND CARE SUPPLY) ×1 IMPLANT
DRESSING TELFA PAD 3X4IN ST 2132 100EA/BX (WOUND CARE/ENTEROSTOMAL SUPPLY) ×1
ELECTRODE PATIENT RTN 9FT VLAB C30- LB RM PHSV ACRL FOAM CORD NONIRRITATE NONSENSITIZE ADH STRP (CAUTERY SUPPLIES) ×1 IMPLANT
ELECTRODE PATIENT RTN 9FT VLAB_REM C30- LB PLHSV ACRL FOAM (CAUTERY SUPPLIES) ×1
GARMENT COMPRESS MED CALF CENTAURA NYL VASOGRAD LTWT BRTHBL SEQ FIL BLU 18- IN (ORTHOPEDICS (NOT IMPLANTS)) ×1 IMPLANT
GARMENT COMPRESS MED CALF CENT_AURA NYL VASOGRAD LTWT BRTHBL (ORTHOPEDICS (NOT IMPLANTS)) ×1
GOWN SURG XL AAMI L4 IMPRV RGL N SLEEVE BRTHBL STRL LF DISP (DGOW) ×2
GOWN SURG XL L4 BRTHBL STRL LF  DISP SMARTGOWN (DGOW) ×2 IMPLANT
KIT TISS CLSR EVICEL 40SQ CM F BRN SEAL 2ML (SEALANTS) IMPLANT
LINER SUCT MEDIVAC FLX ADV TW 1 PC FILTER SHTOF VALVE 1500CC NONST LF  DISP (Suction) ×1 IMPLANT
LINER SUCT MEDIVAC FLX ADV TW_1 PC FLTR LID SHTOF VALVE 1500 (Suction) ×1
PACK SURG CSTM MAJ STRL DISP LF (CUSTOM TRAYS & PACK) ×1
PEN SURG MRKNG DVN SKIN DISP NONSMEAR REG TIP FLXB RLR LBL GNTN VIOL STRL LF (MARK) ×1 IMPLANT
PEN SURG MRKNG DVN SKN DISP NO_NSMEAR REG TIP FLXB RLR LBL (MARK) ×1
PROTECTOR WND SM SURGISLEEVE_WPSM256 5EA/BX (WOUND CARE/ENTEROSTOMAL SUPPLY) ×2
PRTC BLU GRY SURGISLEEVE DIST PROX RING SM INCS TISS 2.5-6CM (WOUND CARE SUPPLY) ×2 IMPLANT
SUTURE 0 CT1 COAT VICRYL 27IN VIOL BRD ABS (SUTURE/WOUND CLOSURE) IMPLANT
SUTURE 0 CT1 VICRYL 27IN VIOL_BRD COAT ABS (SUTURE/WOUND CLOSURE)
SUTURE 0 CT1 VICRYL 36IN VIOL BRD COAT ABS (SUTURE/WOUND CLOSURE) ×2 IMPLANT
SUTURE 0 CT1 VICRYL 36IN VIOL_BRD COAT ABS (SUTURE/WOUND CLOSURE) ×2
SUTURE 0 CT2 ETHIBOND EXC 18IN GRN CR BRD 8 STRN NONAB (SUTURE/WOUND CLOSURE) IMPLANT
SUTURE 2 TAPER FIBERTAPE FIBERWIRE 7INX2MM BLU BRD NONAB (SUTURE/WOUND CLOSURE) ×3 IMPLANT
SUTURE 2 TPR FIBERTAPE FIBERWI_RE 7INX2MM BLU BRD NONAB (SUTURE/WOUND CLOSURE) ×3
SUTURE 2-0 CT1 VICRYL 36IN VIOL BRD COAT ABS (SUTURE/WOUND CLOSURE) ×1 IMPLANT
SUTURE 2-0 CT1 VICRYL 36IN VIO_L BRD COAT ABS (SUTURE/WOUND CLOSURE) ×1
SUTURE 3-0 CT1 VICRYL 27IN UNDYED BRD COAT ABS (SUTURE/WOUND CLOSURE) IMPLANT
SUTURE 3-0 CT1 VICRYL 27IN UND_YED BRD COAT ABS (SUTURE/WOUND CLOSURE)
SUTURE 4-0 PS2 MONOCRYL MTPS 27IN UNDYED MONOF ABS (SUTURE/WOUND CLOSURE) ×1 IMPLANT
SUTURE 4-0 PS2 MONOCRYL MTPS 2_7IN UNDYED MONOF ABS (SUTURE/WOUND CLOSURE) ×1
SUTURE PLSTR 0 CT2 ETHIBOND 18_IN GRN CR BRD 8 STRN NONAB (SUTURE/WOUND CLOSURE)
SUTURE SILK 0 CT1 PERMAHAND 30IN BLK BRD NONAB (SUTURE/WOUND CLOSURE) IMPLANT
SUTURE SILK 0 CT1 PERMAHAND 30_IN BLK BRD NONAB (SUTURE/WOUND CLOSURE)
SUTURE SILK 0 FSL PERMAHAND 18IN BLK BRD NONAB (SUTURE/WOUND CLOSURE) IMPLANT
SUTURE SILK 0 FSL PERMAHAND 18_IN BLK BRD NONAB 12 BX (SUTURE/WOUND CLOSURE)
TIP APPL 35CM EVICEL RIGID FBRN SEAL (SEALANTS) IMPLANT
TIP RIGID LAP EVICEL 3908 3/BX (SEALANTS)
TRAY CATH 16FR SURESTEP LUBRIC ATH STATLK URMTR STAB DEV (UROLOGICAL SUPPLIES) ×2 IMPLANT
TRAY CUSTOM MAJOR - ~~LOC~~ (CUSTOM TRAYS & PACK) ×1 IMPLANT
TRAY CUSTOM MAJOR SCV36CVUNJ_CS/4 (CUSTOM TRAYS & PACK) ×1
TUBE CONN 1/4IN X 6FT_45/CS N66A (Connecting Tubes/Misc) ×2
TUBING SUCT CLR 6FT .25IN MEDIVAC NCDTV PLASTIC MAXIGRIP MALE / MALE CONN STRL LF  DISP (Connecting Tubes/Misc) ×2 IMPLANT

## 2018-04-21 NOTE — Nurses Notes (Signed)
I called the patient's husband, Gabriel Rung, to update him. I informed him that the patient's nausea has improved and she denies any pain and that we are awaiting a room in OPS. He states "this is getting a little ridiculous. It's going on 5 hours and I haven't seen her. Why couldn't you just take to her OPS and let her sleep there?" I explained to him that we needed to monitor her after giving her that medication. That she had been sleeping since we gave it, up until 1508 when she woke up. I apologized for his wait and explained that we are just trying to make sure she is stable enough for discharge. He states he understands.

## 2018-04-21 NOTE — Brief Op Note (Signed)
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

## 2018-04-21 NOTE — OR Surgeon (Signed)
PATIENT NAMEIMUNIQUE, Anita Vasquez   HOSPITAL NUMBER:  [id number]  DATE OF SERVICE: 04/21/2018  DATE OF BIRTH:  1951-05-28    OPERATIVE REPORT    SERVICE:  Thoracic surgery.    ATTENDING:  Laqueta Due, MD.    PREOPERATIVE DIAGNOSIS:  Left ninth and tenth rib cartilage fractures.    POSTOPERATIVE DIAGNOSIS:  Left ninth and tenth rib cartilage fractures.    PROCEDURE:  Left open reduction, internal fixation of ninth and tenth rib cartilage fractures.    ANESTHESIA TYPE:  Double-lumen endotracheal tube anesthesia.    COMPLICATIONS:  None.    BLOOD LOSS:  Minimal.    SPECIMENS:  None.    INDICATIONS FOR PROCEDURE:  This is a pleasant 67 year old female who about 7 months ago fell on a crutch and broke 6 of her left ribs.  She has healed appropriately, but has had chronic pain in the left costal margins since then.  It has not been alleviated with any therapy and no specific diagnosis has previously been made.  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  I diagnosed her with a left tenth rib cartilage fracture with subsequent slipped rib syndrome.  I offered her open reduction, internal fixation of the ninth and tenth ribs with possible other treatment as indicated.  The risks, benefits and alternatives were explained in detail to the patient.  She voiced understanding and agreed to proceed.    DESCRIPTION OF PROCEDURE:  The patient was brought to the operating room and placed in lateral decubitus position after double-lumen endotracheal tube anesthesia was established.  Prophylactic antibiotics were given.  The left chest and abdomen were prepped and draped in the usual sterile fashion and a procedural pause performed.  I palpated the ninth and tenth ribs, both of which were hypermobile and clearly fractured away from the common costal cartilage.  I centered the incision between the 2 fractured rib tips.  A 4 cm incision was made.  The subcutaneous tissue was divided.  The external  oblique muscle fascia was divided.  The external oblique muscle fibers were separated rather than dividing them.  I opened up the loose areolar tissue that was investing the lower costal margin.  A wound protector was placed to provide radial outward retraction and exposure of the area.  There was a 2 cm gap between the tenth and ninth ribs.  There was a 1 cm gap between the ninth and eighth ribs.  I stabilized both of them by placing figure-of-eight 2 mm TigerTape sutures first between the eighth and ninth ribs to stabilize the ninth.  I then placed 2 figure-of-eight sutures in like fashion between the ninth and tenth ribs to stabilize the tenth slipped rib.  There was a very satisfactory fixation and stability of the lower costal margin thereafter.  There were no gaps between the rib tips and the ribs above them.  I took care not to entrap any of the intercostal nerves.  Each of the sutures was placed superiorly above the rib and then the lower aspect of the suture was placed through the rib shaft.  This was done specifically to not tie down either of the intercostal nerves.  The external oblique muscle fibers were closed followed by the external oblique fascia layer being closed.  The subcutaneous tissue was closed followed by a subcuticular suture layer on the skin with skin glue.  Instrument and sponge counts were correct.  Blood loss was minimal.  There were no specimens.  The patient tolerated the procedure well.  She was awakened, extubated, and transferred to the recovery room in a stable condition.        Laqueta Due, MD                DD:  04/21/2018 11:08:24  DT:  04/21/2018 11:41:08 RO  D#:  [id number]

## 2018-04-21 NOTE — Nurses Notes (Signed)
Discharge instructions, scripts for Percocet and diclofenac, and spiro care, reviewed and provided to pt. Marlynn Perking, RN

## 2018-04-21 NOTE — Anesthesia Postprocedure Evaluation (Signed)
Anesthesia Post Op Evaluation    Patient: Anita Vasquez  Procedure(s):  open reduction internal fixation of 9th and 10th left rib fracture    Last Vitals:Temperature: 36.3 C (97.3 F) (04/21/18 1109)  Heart Rate: 67 (04/21/18 1115)  BP (Non-Invasive): (!) 167/82 (04/21/18 1115)  Respiratory Rate: (!) 21 (04/21/18 1115)  SpO2: 95 % (04/21/18 1115)    Patient is sufficiently recovered from the effects of anesthesia to participate in the evaluation and has returned to their pre-procedure level.  Patient location during evaluation: PACU   Post-procedure handoff checklist completed    Patient participation: complete - patient participated  Level of consciousness: awake and alert and responsive to verbal stimuli  Pain management: adequate  Airway patency: patent  Anesthetic complications: no  Cardiovascular status: acceptable  Respiratory status: acceptable  Hydration status: acceptable  Patient post-procedure temperature: Pt Normothermic   PONV Status: Absent

## 2018-04-21 NOTE — Nurses Notes (Signed)
Patient denies any nausea at this time. Resting comfortably. Still a little drowsy due to the phenergan dose she had. She wakes easily to talk to me and denies any pain.

## 2018-04-21 NOTE — Anesthesia Preprocedure Evaluation (Signed)
ANESTHESIA PRE-OP EVALUATION  Planned Procedure: 10th rib fracture possible reconstruction 9th rib possible plating (Left Chest)  Review of Systems                   Pulmonary   asthma,   Cardiovascular    No peripheral edema,        GI/Hepatic/Renal        Endo/Other          Neuro/Psych/MS        Cancer                     Physical Assessment      Airway       Mallampati: II      Neck ROM: full  Mouth Opening: good.            Dental       Dentition intact             Pulmonary    Breath sounds clear to auscultation  (-) no rhonchi, no decreased breath sounds, no wheezes, no rales and no stridor     Cardiovascular    Rhythm: regular  Rate: Normal  (-) no friction rub, carotid bruit is not present, no peripheral edema and no murmur     Other findings            Plan  Planned anesthesia type: GETA    ASA 2     Intravenous induction     Anesthetic plan and risks discussed with patient.         Use of blood products discussed with patient whom consented to blood products.     Patient's NPO status is appropriate for Anesthesia.

## 2018-04-21 NOTE — Anesthesia Transfer of Care (Signed)
ANESTHESIA TRANSFER OF CARE   Anita Vasquez is a 67 y.o. ,female, Weight: 68 kg (150 lb)   had Procedure(s):  open reduction internal fixation of 9th and 10th left rib fracture  performed  04/21/18   Primary Service: Janyth Contes, MD    Past Medical History:   Diagnosis Date   . Asthma    . Broken ribs    . Known health problems: none       Allergy History as of 04/21/18     CETYLPYRIDINIUM-BENZOCAINE       Noted Status Severity Type Reaction    04/13/18 [address], [place], [place] 04/13/18 Active High  Anaphylaxis    04/13/18 [address], [place], [place] 04/13/18 Active             PENICILLINS       Noted Status Severity Type Reaction    04/13/18 [address], [place] 04/13/18 Active High  Anaphylaxis          LACTASE       Noted Status Severity Type Reaction    04/19/18 [address], RN 04/13/18 Deleted Medium      04/13/18 [address], [place] 04/13/18 Active Medium            MILK       Noted Status Severity Type Reaction    04/19/18 [address], [place] 04/19/18 Active Low  Nausea/ Vomiting          INFLUENZA VIRUS VACCINES       Noted Status Severity Type Reaction    04/19/18 [address], RN 04/19/18 Active High  Anaphylaxis          OTHER       Noted Status Severity Type Reaction    04/19/18 [address], [place] 04/19/18 Active High  Anaphylaxis    Comments:  All mycins           PHENYLEPHRINE       Noted Status Severity Type Reaction    04/19/18 [address], [place] 04/19/18 Active High  Anaphylaxis          TROPICAMIDE       Noted Status Severity Type Reaction    04/19/18 [address], [place] 04/19/18 Active High  Anaphylaxis          BENZALKONIUM CHLORIDE       Noted Status Severity Type Reaction    04/19/18 [address], [place] 04/19/18 Active Medium  Rash              I completed my transfer of care / handoff to the receiving personnel during which we discussed:  All key/critical aspects of case discussed, Expectation of post procedure and Gave opportunity  for questions and acknowledgement of understanding    Post Location: PACU                                                                Last OR Temp: Temperature: 36.3 C (97.3 F)  ABG:  POTASSIUM   Date Value Ref Range Status   04/20/2018 4.4 3.5 - 5.0 mmol/L Final     CALCIUM   Date Value Ref Range Status   04/20/2018 9.5 8.8 - 10.3 mg/dL Final  Calculated P Axis   Date Value Ref Range Status   04/20/2018 36 deg Final     Calculated T Axis   Date Value Ref Range Status   04/20/2018 54 deg Final     Airway:* No LDAs found *  Blood pressure (!) 170/96, pulse 64, temperature 36.3 C (97.3 F), resp. rate 14, height 1.6 m (5\' 3" ), weight 68 kg (150 lb), SpO2 100 %.

## 2018-04-21 NOTE — Interval H&P Note (Signed)
[facility]      H&P UPDATE FORM                                                                                  Anita Vasquez, Anita Vasquez, 67 y.o. female  Date of Admission:  04/21/2018  Date of Birth:  [birth year]    04/21/2018    STOP: IF H&P IS GREATER THAN 30 DAYS FROM SURGICAL DAY COMPLETE NEW H&P IS REQUIRED.     H & P updated the day of the procedure.  1.  H&P completed within 30 days of surgical procedure and has been reviewed within 24 hours of admission but prior to surgery or a procedure requiring anesthesia services, the patient has been examined, and no change has occured in the patients condition since the H&P was completed.       Change in medications: No        No LMP recorded. Patient is postmenopausal.      Comments:     2.  Patient continues to be appropriate candidate for planned surgical procedure. YES    Janyth Contes, MD

## 2018-04-27 ENCOUNTER — Encounter (HOSPITAL_BASED_OUTPATIENT_CLINIC_OR_DEPARTMENT_OTHER): Payer: Self-pay | Admitting: THORACIC SURGERY CARDIOTHORACIC VASCULAR SURGERY

## 2018-04-27 ENCOUNTER — Ambulatory Visit
Payer: 59 | Attending: THORACIC SURGERY CARDIOTHORACIC VASCULAR SURGERY | Admitting: THORACIC SURGERY CARDIOTHORACIC VASCULAR SURGERY

## 2018-04-27 VITALS — BP 170/102 | HR 86 | Temp 98.0°F | Resp 18 | Ht 64.0 in | Wt 150.0 lb

## 2018-04-27 DIAGNOSIS — S2242XA Multiple fractures of ribs, left side, initial encounter for closed fracture: Principal | ICD-10-CM

## 2018-04-27 NOTE — Progress Notes (Signed)
[facility] Thoracic Surgery  Post-op Note    Name: Anita Vasquez  MRN: [id number]  Date of visit: 04/27/2018     Subjective:  Patient is a 67 y.o. female who was seen today for left rib pain.  It is located in her left costal margin and radiates in a stripe posteriorly to her back.  It has been present for one year and occurs multiple times daily.  It began when she had a broken leg and was on crutches.  She fell onto the crutch striking her ribs hard.  She was found at the time to have left 6-10th rib fractures.  Ibuprofen and rest minimally improves it.  It lasts for hours and occurs when she does any activities, such as pulling weeds.  It is described by the patient as severe pain.  Her husband states that she is often unable to do anything because of the pain.  She denies depression or suicidal ideations due to the pain.  Workup by her PCP, an Orthopedic surgeon, and a Thoracic surgeon in [place] (from which she has traveled today) has revealed nothing except healed 6-10th left rib fractures.  She has had 2 CTs and rib films.  I reviewed the images from her chest and abdomen CT scans and note a flared anterior left 9th rib and a separation of the tip of the left 10th rib cartilage.  She underwent ORIF of both the slipped 9th and 10th left ribs on 04/21/18. Since surgery she has felt remarkably improved and is highly satisfied.  She is off narcotics and still only taking naproxen as I advised.    There are no active problems to display for this patient.       Objective:  BP (!) 170/102   Pulse 86   Temp 36.7 C (98 F)   Resp 18   Ht 1.626 m (5\' 4" )   Wt 68 kg (150 lb)   SpO2 96%   BMI 25.75 kg/m       Physical Exam:  General: Well nourished.  Lungs:   Clear to auscultation bilaterally  Heart: Regular rate and rhythm, S1, S2 normal, no murmur, click, rub or gallop  Abdomen: Soft, non-tender. Bowel sounds normal. No masses,  No organomegaly.  Extremities: Extremities normal, atraumatic, no cyanosis or  edema.  Surgical Incisions: Healing    Assessment:  1. Closed fracture of multiple ribs of left side, initial encounter       Surgically resolved.    Plan:  Follow up by FaceTime in 3 weeks, since they live in [place].      Janyth Contes, MD

## 2018-08-20 ENCOUNTER — Ambulatory Visit (HOSPITAL_BASED_OUTPATIENT_CLINIC_OR_DEPARTMENT_OTHER)
Admission: RE | Admit: 2018-08-20 | Discharge: 2018-08-20 | Disposition: A | Discharge status: Home / Self Care | Payer: Medicare Other | Source: Ambulatory Visit | Attending: Family Medicine | Admitting: Family Medicine

## 2018-08-20 ENCOUNTER — Ambulatory Visit: Payer: Medicare Other | Admitting: Family Medicine

## 2018-08-20 ENCOUNTER — Encounter: Payer: Self-pay | Admitting: Family Medicine

## 2018-08-20 VITALS — BP 122/86 | HR 73 | Temp 97.4°F | Resp 16 | Ht 62.5 in | Wt 157.5 lb

## 2018-08-20 DIAGNOSIS — R05 Cough: Secondary | ICD-10-CM

## 2018-08-20 DIAGNOSIS — Z7689 Persons encountering health services in other specified circumstances: Secondary | ICD-10-CM | POA: Diagnosis not present

## 2018-08-20 DIAGNOSIS — J45901 Unspecified asthma with (acute) exacerbation: Secondary | ICD-10-CM | POA: Insufficient documentation

## 2018-08-20 DIAGNOSIS — K219 Gastro-esophageal reflux disease without esophagitis: Secondary | ICD-10-CM | POA: Insufficient documentation

## 2018-08-20 DIAGNOSIS — J3089 Other allergic rhinitis: Secondary | ICD-10-CM

## 2018-08-20 DIAGNOSIS — K209 Esophagitis, unspecified without bleeding: Secondary | ICD-10-CM | POA: Insufficient documentation

## 2018-08-20 DIAGNOSIS — R059 Cough, unspecified: Secondary | ICD-10-CM

## 2018-08-20 DIAGNOSIS — G500 Trigeminal neuralgia: Secondary | ICD-10-CM | POA: Insufficient documentation

## 2018-08-20 DIAGNOSIS — J309 Allergic rhinitis, unspecified: Secondary | ICD-10-CM | POA: Insufficient documentation

## 2018-08-20 DIAGNOSIS — J45909 Unspecified asthma, uncomplicated: Secondary | ICD-10-CM | POA: Insufficient documentation

## 2018-08-20 DIAGNOSIS — I10 Essential (primary) hypertension: Secondary | ICD-10-CM | POA: Insufficient documentation

## 2018-08-20 MED ORDER — PREDNISONE 20 MG PO TABS: ORAL_TABLET | ORAL | 0 refills | Status: DC

## 2018-08-20 MED ORDER — BENZONATATE 100 MG PO CAPS: 100.0000 mg | ORAL_CAPSULE | Freq: Two times a day (BID) | ORAL | 0 refills | Status: DC | PRN

## 2018-08-20 MED ORDER — ALBUTEROL SULFATE HFA 108 (90 BASE) MCG/ACT IN AERS: 2.0000 | INHALATION_SPRAY | Freq: Four times a day (QID) | RESPIRATORY_TRACT | 11 refills | Status: DC | PRN

## 2018-08-20 MED ORDER — LEVOCETIRIZINE DIHYDROCHLORIDE 5 MG PO TABS: 5.0000 mg | ORAL_TABLET | Freq: Every evening | ORAL | 3 refills | Status: DC

## 2018-08-20 NOTE — Patient Instructions (Addendum)
It was a pleasure meeting you today.  I have called prednisone taper for 10 days, take as directed.  Start xyzal- stop benadryl and zyrtec.  Tessalon Perles for cough called in- continue mucinex DM as well.  I refilled your albuterol inhaler.   Please have xray at [facility]. When we get the reading we call you. If normal may not hear back from [place] until Monday.   I bleieve this is more allergy related.   Please make an appt for your Medical monitoring plan.     Please help [place] help you:  We are honored you have chosen [facility] for your Primary Care home. Below you will find basic instructions that you may need to access in the future. Please help [place] help you by reading the instructions, which cover many of the frequent questions we experience.   Prescription refills and request:  -In order to allow more efficient response time, please call your pharmacy for all refills. They will forward the request electronically to [place]. This allows for the quickest possible response. Request left on a nurse line can take longer to refill, since these are checked as time allows between office patients and other phone calls.  - refill request can take up to 3-5 working days to complete.  - If request is sent electronically and request is appropiate, it is usually completed in 1-2 business days.  - all patients will need to be seen routinely for all chronic medical conditions requiring prescription medications (see follow-up below). If you are overdue for follow up on your condition, you will be asked to make an appointment and we will call in enough medication to cover you until your appointment (up to 30 days).  - all controlled substances will require a face to face visit to request/refill.  - if you desire your prescriptions to go through a new pharmacy, and have an active script at original pharmacy, you will need to call your pharmacy and have scripts transferred to new pharmacy. This is  completed between the pharmacy locations and not by your provider.    Results: If any images or labs were ordered, it can take up to 1 week to get results depending on the test ordered and the lab/facility running and resulting the test. - Normal or stable results, which do not need further discussion, may be released to your mychart immediately with attached note to you. A call may not be generated for normal results. Please make certain to sign up for mychart. If you have questions on how to activate your mychart you can call the front office.  - If your results need further discussion, our office will attempt to contact you via phone, and if unable to reach you after 2 attempts, we will release your abnormal result to your mychart with instructions.  - All results will be automatically released in mychart after 1 week.  - Your provider will provide you with explanation and instruction on all relevant material in your results. Please keep in mind, results and labs may appear confusing or abnormal to the untrained eye, but it does not mean they are actually abnormal for you personally. If you have any questions about your results that are not covered, or you desire more detailed explanation than what was provided, you should make an appointment with your provider to do so.   Our office handles many outgoing and incoming calls daily. If we have not contacted you within 1 week about your  results, please check your mychart to see if there is a message first and if not, then contact our office.  In helping with this matter, you help decrease call volume, and therefore allow us to be able to respond to patients needs more efficiently.   Acute office visits (sick visit):  An acute visit is intended for a new problem and are scheduled in shorter time slots to allow schedule openings for patients with new problems. This is the appropriate visit to discuss a new problem. Problems will not be addressed by phone  call or Echart message. Appointment is needed if requesting treatment. In order to provide you with excellent quality medical care with proper time for you to explain your problem, have an exam and receive treatment with instructions, these appointments should be limited to one new problem per visit. If you experience a new problem, in which you desire to be addressed, please make an acute office visit, we save openings on the schedule to accommodate you. Please do not save your new problem for any other type of visit, let us take care of it properly and quickly for you.   Follow up visits:  Depending on your condition(s) your provider will need to see you routinely in order to provide you with quality care and prescribe medication(s). Most chronic conditions (Example: hypertension, Diabetes, depression/anxiety... etc), require visits a couple times a year. Your provider will instruct you on proper follow up for your personal medical conditions and history. Please make certain to make follow up appointments for your condition as instructed. Failing to do so could result in lapse in your medication treatment/refills. If you request a refill, and are overdue to be seen on a condition, we will always provide you with a 30 day script (once) to allow you time to schedule.    Medicare wellness (well visit): - we have a wonderful Nurse Selena Batten(Kim), that will meet with you and provide you will yearly medicare wellness visits. These visits should occur yearly (can not be scheduled less than 1 calendar year apart) and cover preventive health, immunizations, advance directives and screenings you are entitled to yearly through your medicare benefits. Do not miss out on your entitled benefits, this is when medicare will pay for these benefits to be ordered for you.  These are strongly encouraged by your provider and is the appropriate type of visit to make certain you are up to date with all preventive health benefits. If you  have not had your medicare wellness exam in the last 12 months, please make certain to schedule one by calling the office and schedule your medicare wellness with Selena BattenKim as soon as possible.   Yearly physical (well visit):  - Adults are recommended to be seen yearly for physicals. Check with your insurance and date of your last physical, most insurances require one calendar year between physicals. Physicals include all preventive health topics, screenings, medical exam and labs that are appropriate for gender/age and history. You may have fasting labs needed at this visit. This is a well visit (not a sick visit), new problems should not be covered during this visit (see acute visit).  - Pediatric patients are seen more frequently when they are younger. Your provider will advise you on well child visit timing that is appropriate for your their age. - This is not a medicare wellness visit. Medicare wellness exams do not have an exam portion to the visit. Some medicare companies allow for a physical, some do not  allow a yearly physical. If your medicare allows a yearly physical you can schedule the medicare wellness with our nurse Selena Batten and have your physical with your provider after, on the same day. Please check with insurance for your full benefits.   Late Policy/No Shows:  - all new patients should arrive 15-30 minutes earlier than appointment to allow [place] time  to  obtain all personal demographics,  insurance information and for you to complete office paperwork. - All established patients should arrive 10-15 minutes earlier than appointment time to update all information and be checked in .  - In our best efforts to run on time, if you are late for your appointment you will be asked to either reschedule or if able, we will work you back into the schedule. There will be a wait time to work you back in the schedule,  depending on availability.  - If you are unable to make it to your appointment as scheduled,  please call 24 hours ahead of time to allow [place] to fill the time slot with someone else who needs to be seen. If you do not cancel your appointment ahead of time, you may be charged a no show fee.

## 2018-08-20 NOTE — Progress Notes (Signed)
Patient ID: Alexandria Rush, female  DOB: 09/13/1950, 68 y.o.   MRN: [id number] Patient Care Team    Relationship Specialty Notifications Start End  Natalia LeatherwoodKuneff, Kentrel Clevenger A, DO PCP - General Family Medicine  08/20/18   Nyoka CowdenWert, Michael B, MD Consulting Physician Pulmonary Disease  08/20/18   Janyth ContesHansen, Adam J, MD    08/20/18   Vida RiggerMagod, Marc, MD Consulting Physician Gastroenterology  08/20/18   Orma FlamingFernandez, Andrea, MD Referring Physician Obstetrics and Gynecology  08/20/18     Chief Complaint  Patient presents with  . Cough    x1 month non productive cough   . Nasal Congestion    Nasal congestion on right side of face and goes to ear and neck   . New Patient (Initial Visit)    Dr Nedra HaiLee @ [facility]     Subjective:  Alexandria Rush is a 68 y.o.  female present for new patient establishment. All past medical history, surgical history, allergies, family history, immunizations, medications and social history were updated in the electronic medical record today. All recent labs, ED visits and hospitalizations within the last year were reviewed.  Cough:  Pt presents today with her husband to discuss the cough she has had for about 1 month; [place] Dec. 26 [place] At that time she felt she had "severe" congestion in her lungs and had difficulty breathing. She had her husband had an influenza like illness at that time. Neither had been medically evaluated. She is allergic to the flu shot. She reports a sore throat, fever and chills at onset- but not recently. She complains of maxillary sinus pain today, as well pain with cough in her right side of back and burning of her mid-chest. She has mild intermittent asthma and uses an albuterol inhaler when needed. She has been using deslym, mucinex and benadryl as needed. She has a h/o upper airway cough syndrome and asthma. GFR > 60 04/20/2018 in CE tab.  She is allergic to many antibiotics.   [facility] 08/[facility]/2018  Decreased  Interest 0  Down, Depressed, Hopeless 0  PHQ - 2 Score 0   No flowsheet data found.     No flowsheet data found.  There is no immunization history on file for this patient.  No exam data present  Past Medical History:  Diagnosis Date  . Asthma   . Chicken pox   . GERD (gastroesophageal reflux disease)   . Rib fracture 08/27/2017   left 6-9 rib fx   Allergies  Allergen Reactions  . Erythromycin Anaphylaxis  . Influenza Vaccines Anaphylaxis  . Penicillins Anaphylaxis  . Phenylephrine Anaphylaxis  . Tetracycline Anaphylaxis  . Tropicamide Swelling  . Benzalkonium Chloride Rash  . Lactase     Other reaction(s): Wheezing Other reaction(s): Wheezing   . Pneumococcal Vaccine Other (See Comments)    Pt doesn't take this vaccine as a precaution due to other allergens she has. Other reaction(s): Other (See Comments) Pt doesn't take this vaccine as a precaution due to other allergens she has.   . Lac Bovis Nausea And Vomiting  . Neomycin-Bacitracin Zn-Polymyx Rash   Past Surgical History:  Procedure Laterality Date  . APPENDECTOMY    . COLONOSCOPY  2019  . HYSTEROSCOPY    . NASAL SINUS SURGERY  2014  . RIB FRACTURE SURGERY  2019  . TONSILLECTOMY     Family History  Problem Relation Age of Onset  . Stomach cancer Mother   .  Hypertension Father   . Heart disease Father   . Diabetes Brother   . Diabetes Paternal Aunt   . Allergies Brother   . Allergies Sister   . Allergies Son   . Allergies Daughter   . Asthma Brother   . Asthma Sister    Social History   Social History Narrative   Marital status/children/pets: married, 2 children. From [facility]   Education/employment: masters degree. Retired- Sr. Conservator, museum/gallery:      -smoke alarm in the home:Yes     - wears seatbelt: Yes     - Feels safe in their relationships: Yes    Allergies as of 08/20/2018      Reactions   Erythromycin Anaphylaxis   Influenza Vaccines Anaphylaxis   Penicillins Anaphylaxis    Phenylephrine Anaphylaxis   Tetracycline Anaphylaxis   Tropicamide Swelling   Benzalkonium Chloride Rash   Lactase    Other reaction(s): Wheezing Other reaction(s): Wheezing   Pneumococcal Vaccine Other (See Comments)   Pt doesn't take this vaccine as a precaution due to other allergens she has. Other reaction(s): Other (See Comments) Pt doesn't take this vaccine as a precaution due to other allergens she has.   Lac Bovis Nausea And Vomiting   Neomycin-bacitracin Zn-polymyx Rash      Medication List       Accurate as of August 20, 2018  2:38 PM. Always use your most recent med list.        albuterol 108 (90 Base) MCG/ACT inhaler Commonly known as:  VENTOLIN HFA Inhale 2 puffs into the lungs every 6 (six) hours as needed for wheezing or shortness of breath.   benzonatate 100 MG capsule Commonly known as:  TESSALON Take 1 capsule (100 mg total) by mouth 2 (two) times daily as needed for cough.   chlorpheniramine 4 MG tablet Commonly known as:  CHLOR-TRIMETON Take 4 mg by mouth at bedtime.   dextromethorphan 30 MG/5ML liquid Commonly known as:  DELSYM Take 15 mg by mouth at bedtime.   famotidine 20 MG tablet Commonly known as:  PEPCID One at bedtime   levocetirizine 5 MG tablet Commonly known as:  XYZAL Take 1 tablet (5 mg total) by mouth every evening.   predniSONE 20 MG tablet Commonly known as:  DELTASONE 60 mg x3d, 40 mg x3d, 20 mg x2d, 10 mg x2d       All past medical history, surgical history, allergies, family history, immunizations andmedications were updated in the EMR today and reviewed under the history and medication portions of their EMR.    No results found for this or any previous visit (from the past 2160 hour(s)).  ROS: 14 pt review of systems performed and negative (unless mentioned in an HPI)  Objective: BP 122/86 (BP Location: Left Arm, Patient Position: Sitting, Cuff Size: Normal)   Pulse 73   Temp (!) 97.4 F (36.3 C) (Oral)   Resp 16    Ht 5' 2.5" (1.588 m)   Wt 157 lb 8 oz (71.4 kg)   SpO2 98%   BMI 28.35 kg/m  Gen: Afebrile. No acute distress. Nontoxic in appearance, well-developed, well-nourished,  Overweight, female.  HENT: AT. [place]. Bilateral TM visualized and normal in appearance, normal external auditory canal. MMM, no oral lesions, adequate dentition. Bilateral nares with mild drainage. Throat without erythema, ulcerations or exudates. Mild  Cough on exam, mild hoarseness on exam. Eyes:Pupils Equal Round Reactive to light, Extraocular movements intact,  Conjunctiva without redness, discharge or icterus.  Neck/lymp/endocrine: Supple,no lymphadenopathy CV: RRR, no edema Chest: difficult exam- pt had pain with effort- was not taking deep breaths. no wheeze. ? rales. normal Respiratory effort-when not focused on deep breathing. Abd: Soft. NTND. BS present.  Skin: no rashes, purpura or petechiae.  Neuro/Msk: Normal gait. PERLA. EOMi. Alert. Oriented x3.   Psych: Normal affect, dress and demeanor. Normal speech. Normal thought content and judgment.   Assessment/plan: ILLYA ROSTEK is a 68 y.o. female present for EST care Cough/Non-seasonal allergic rhinitis due to other allergic trigger Rest, hydrate. No obvious signs of infection - lung exam was difficult to evaluate 2/2 to discomfort with deep breaths right thoracic area.  +flonase, mucinex (DM if cough) Stop benadryl ; start  xyzal before bed.  Prednisone taper prescribed, take until completed.  Tessalon perles for cough.  Albuterol inhaler refilled.  If cough present it can last up to 6-8 weeks.  F/U 2 weeks of not improved.  - DG Chest 2 View; Future  Return if symptoms worsen or fail to improve.   Note is dictated utilizing voice recognition software. Although note has been proof read prior to signing, occasional typographical errors still can be missed. If any questions arise, please do not hesitate to call for verification.  Electronically signed  by: Felix Pacini, DO [facility] [place]

## 2018-08-27 ENCOUNTER — Other Ambulatory Visit: Payer: Self-pay | Admitting: Family Medicine

## 2018-08-27 NOTE — Progress Notes (Signed)
Opened in Error.

## 2018-08-30 ENCOUNTER — Ambulatory Visit: Payer: PRIVATE HEALTH INSURANCE | Admitting: Family Medicine

## 2018-08-30 ENCOUNTER — Telehealth: Payer: Self-pay

## 2018-08-30 ENCOUNTER — Ambulatory Visit: Payer: Self-pay

## 2018-08-30 NOTE — Telephone Encounter (Signed)
Medical monitoring lab orders faxed to [facility] drawing [facility] in [place] [phone]).  Patient advised of address and instructed to take insurance card with her to location.  Patient voiced understanding, scheduled f/u appt with PCP to discuss results.   Lead Blood Creatinine Serum BUN UA LFT GFR CBC/Diff Uric Acid Urine Micro Albumin/creatine ratio Urine Calcium Occult Blood Fecal Hgb

## 2018-09-01 ENCOUNTER — Other Ambulatory Visit: Payer: Self-pay | Admitting: Family Medicine

## 2018-09-02 LAB — URINALYSIS, COMPLETE
Bilirubin, UA: NEGATIVE
Glucose, UA: NEGATIVE
Ketones, UA: NEGATIVE
Nitrite, UA: NEGATIVE
Protein, UA: NEGATIVE
RBC, UA: NEGATIVE
Specific Gravity, UA: 1.019 (ref 1.005–1.030)
Urobilinogen, Ur: 0.2 mg/dL (ref 0.2–1.0)
pH, UA: 7.5 (ref 5.0–7.5)

## 2018-09-02 LAB — MICROSCOPIC EXAMINATION
BACTERIA UA: NONE SEEN
Casts: NONE SEEN /lpf
Epithelial Cells (non renal): NONE SEEN /hpf (ref 0–10)

## 2018-09-02 LAB — CBC WITH DIFFERENTIAL/PLATELET
Basophils Absolute: 0 10*3/uL (ref 0.0–0.2)
Basos: 0 %
EOS (ABSOLUTE): 0.2 10*3/uL (ref 0.0–0.4)
Eos: 3 %
Hematocrit: 41.1 % (ref 34.0–46.6)
Hemoglobin: 13.5 g/dL (ref 11.1–15.9)
IMMATURE GRANULOCYTES: 1 %
Immature Grans (Abs): 0 10*3/uL (ref 0.0–0.1)
Lymphocytes Absolute: 3.6 10*3/uL — ABNORMAL HIGH (ref 0.7–3.1)
Lymphs: 40 %
MCH: 32 pg (ref 26.6–33.0)
MCHC: 32.8 g/dL (ref 31.5–35.7)
MCV: 97 fL (ref 79–97)
MONOS ABS: 0.9 10*3/uL (ref 0.1–0.9)
Monocytes: 11 %
NEUTROS PCT: 45 %
Neutrophils Absolute: 4.1 10*3/uL (ref 1.4–7.0)
Platelets: 307 10*3/uL (ref 150–450)
RBC: 4.22 x10E6/uL (ref 3.77–5.28)
RDW: 14.6 % (ref 11.7–15.4)
WBC: 8.9 10*3/uL (ref 3.4–10.8)

## 2018-09-02 LAB — MICROALBUMIN / CREATININE URINE RATIO
Creatinine, Urine: 102.1 mg/dL
Microalb/Creat Ratio: 5 mg/g creat (ref 0–29)
Microalbumin, Urine: 5.3 ug/mL

## 2018-09-02 LAB — GLOM FILT RATE, ESTIMATED
Creatinine, Ser: 0.79 mg/dL (ref 0.57–1.00)
GFR calc Af Amer: 90 mL/min/{1.73_m2} (ref >59)
GFR calc non Af Amer: 78 mL/min/{1.73_m2} (ref >59)

## 2018-09-02 LAB — HEPATIC FUNCTION PANEL
ALT: 12 IU/L (ref 0–32)
AST: 15 IU/L (ref 0–40)
Albumin: 4.4 g/dL (ref 3.8–4.8)
Alkaline Phosphatase: 92 IU/L (ref 39–117)
Bilirubin Total: 0.4 mg/dL (ref 0.0–1.2)
Bilirubin, Direct: 0.11 mg/dL (ref 0.00–0.40)
Total Protein: 6.9 g/dL (ref 6.0–8.5)

## 2018-09-02 LAB — CALCIUM, URINE, RANDOM: Calcium, Urine: 6.1 mg/dL

## 2018-09-02 LAB — URIC ACID: Uric Acid: 3.7 mg/dL (ref 2.5–7.1)

## 2018-09-02 LAB — BUN: BUN: 12 mg/dL (ref 8–27)

## 2018-09-02 LAB — LEAD, BLOOD (ADULT >= 16 YRS): Lead-Whole Blood: NOT DETECTED ug/dL (ref 0–4)

## 2018-09-09 ENCOUNTER — Encounter: Payer: Self-pay | Admitting: Family Medicine

## 2018-09-09 DIAGNOSIS — M797 Fibromyalgia: Secondary | ICD-10-CM | POA: Insufficient documentation

## 2018-09-16 LAB — FECAL OCCULT BLOOD, GUAIAC: Fecal Occult Blood: NEGATIVE

## 2018-09-22 ENCOUNTER — Other Ambulatory Visit: Payer: Self-pay

## 2018-09-22 ENCOUNTER — Encounter: Payer: Self-pay | Admitting: Family Medicine

## 2018-09-22 ENCOUNTER — Telehealth: Payer: Self-pay | Admitting: Family Medicine

## 2018-09-22 ENCOUNTER — Ambulatory Visit (INDEPENDENT_AMBULATORY_CARE_PROVIDER_SITE_OTHER): Payer: PRIVATE HEALTH INSURANCE | Admitting: Family Medicine

## 2018-09-22 VITALS — BP 125/82 | HR 73 | Temp 97.8°F | Resp 16 | Ht 63.0 in | Wt 153.5 lb

## 2018-09-22 DIAGNOSIS — Z77018 Contact with and (suspected) exposure to other hazardous metals: Secondary | ICD-10-CM | POA: Diagnosis not present

## 2018-09-22 DIAGNOSIS — R3129 Other microscopic hematuria: Secondary | ICD-10-CM

## 2018-09-22 HISTORY — DX: Other microscopic hematuria: R31.29

## 2018-09-22 NOTE — Progress Notes (Signed)
Alexandria Rush , 27-May-1951, 68 y.o., female MRN: [id number] Patient Care Team    Relationship Specialty Notifications Start End  Natalia Leatherwood, DO PCP - General Family Medicine  08/20/18   Nyoka Cowden, MD Consulting Physician Pulmonary Disease  08/20/18   Janyth Contes, MD    08/20/18   Vida Rigger, MD Consulting Physician Gastroenterology  08/20/18   Orma Flaming, MD Referring Physician Obstetrics and Gynecology  08/20/18     Chief Complaint  Patient presents with  . Follow-up    Would like to discuss lab results. Fasting      Subjective: Pt presents for an OV for [facility] Monitoring plan to look for positive findings of disease associated with exposure to zinc, cadmium, arsenic or lead sustained when she lived in [place]. Monitoring exam is completed every 2 years.  Patient is without complaints today. Labs required by program collected prior to appointment today through [facility].   Depression screen [facility] 2/9 08/20/2018  Decreased Interest 0  Down, Depressed, Hopeless 0  PHQ - 2 Score 0    Allergies  Allergen Reactions  . Erythromycin Anaphylaxis  . Influenza Vaccines Anaphylaxis  . Penicillins Anaphylaxis  . Phenylephrine Anaphylaxis  . Tetracycline Anaphylaxis  . Tropicamide Swelling  . Benzalkonium Chloride Rash  . Lactase     Other reaction(s): Wheezing Other reaction(s): Wheezing   . Pneumococcal Vaccine Other (See Comments)    Pt doesn't take this vaccine as a precaution due to other allergens she has. Other reaction(s): Other (See Comments) Pt doesn't take this vaccine as a precaution due to other allergens she has.   . Lac Bovis Nausea And Vomiting  . Neomycin-Bacitracin Zn-Polymyx Rash   Social History   Social History Narrative   Marital status/children/pets: married, 2 children. From [facility]   Education/employment: masters degree. Retired- Sr. Conservator, museum/gallery:      -smoke alarm in the home:Yes     - wears seatbelt: Yes   - Feels safe in their relationships: Yes   Past Medical History:  Diagnosis Date  . Asthma   . Chicken pox   . GERD (gastroesophageal reflux disease)   . Rib fracture 08/27/2017   left 6-9 rib fx   Past Surgical History:  Procedure Laterality Date  . APPENDECTOMY    . COLONOSCOPY  2019  . HYSTEROSCOPY    . NASAL SINUS SURGERY  2014  . RIB FRACTURE SURGERY  2019  . TONSILLECTOMY     Family History  Problem Relation Age of Onset  . Stomach cancer Mother   . Hypertension Father   . Heart disease Father   . Diabetes Brother   . Diabetes Paternal Aunt   . Allergies Brother   . Allergies Sister   . Allergies Son   . Allergies Daughter   . Asthma Brother   . Asthma Sister    Allergies as of 09/22/2018      Reactions   Erythromycin Anaphylaxis   Influenza Vaccines Anaphylaxis   Penicillins Anaphylaxis   Phenylephrine Anaphylaxis   Tetracycline Anaphylaxis   Tropicamide Swelling   Benzalkonium Chloride Rash   Lactase    Other reaction(s): Wheezing Other reaction(s): Wheezing   Pneumococcal Vaccine Other (See Comments)   Pt doesn't take this vaccine as a precaution due to other allergens she has. Other reaction(s): Other (See Comments) Pt doesn't take this vaccine as a precaution due to other allergens she has.   Lac Bovis Nausea And Vomiting  Neomycin-bacitracin Zn-polymyx Rash      Medication List       Accurate as of September 22, 2018  1:25 PM. Always use your most recent med list.        albuterol 108 (90 Base) MCG/ACT inhaler Commonly known as:  VENTOLIN HFA Inhale 2 puffs into the lungs every 6 (six) hours as needed for wheezing or shortness of breath.   famotidine 20 MG tablet Commonly known as:  PEPCID One at bedtime       All past medical history, surgical history, allergies, family history, immunizations andmedications were updated in the EMR today and reviewed under the history and medication portions of their EMR.     ROS: Negative, with the  exception of above mentioned in HPI   Objective:  BP 125/82 (BP Location: Left Arm, Patient Position: Sitting, Cuff Size: Normal)   Pulse 73   Temp 97.8 F (36.6 C) (Oral)   Resp 16   Ht 5\' 3"  (1.6 m)   Wt 153 lb 8 oz (69.6 kg)   SpO2 99%   BMI 27.19 kg/m  Body mass index is 27.19 kg/m. Gen: Afebrile. No acute distress. Nontoxic in appearance, well developed, well nourished.  HENT: AT. [place]. Bilateral TM visualized without erythema or fullness. MMM, no oral lesions. Bilateral nares without erythema or drianage. Throat without erythema or exudates. No cough, no hoarseness.  Eyes:Pupils Equal Round Reactive to light, Extraocular movements intact,  Conjunctiva without redness, discharge or icterus. Neck/lymp/endocrine: Supple,no lymphadenopathy CV: RRR no murmur, no edema Chest: CTAB, no wheeze or crackles. Good air movement, normal resp effort.  Abd: Soft. flat. NTND. BS present. no Masses palpated. No rebound or guarding.  Skin: no rashes, purpura or petechiae.  Neuro:  Normal gait. PERLA. EOMi. Alert. Oriented x3 Cranial nerves II through XII intact.  Psych: Normal affect, dress and demeanor. Normal speech. Normal thought content and judgment.  No exam data present No results found. No results found for this or any previous visit (from the past 24 hour(s)).  Assessment/Plan: Alexandria Rush is a 68 y.o. female present for OV for  Contact with and (suspected) exposure to other hazardous metals - all labs reviewed with pt today. Exam is normal. She is without symptoms.  - FOBT results were also negative - copy obtained from [facility] and added to chart.  microscopic hematuria - discussed positive urine for hematuria- retest ordered 4 weeks after initial test - Urinalysis, Complete; Future - Hematuria present--> repeat urinalysis micro in 1-2 weeks at [facility]. If still + for hematuria will need to contact referral specialist for urology given her exposure referral ([phone]).    All billing and claims must be submitted to: [employer] [address], [place], [address] [place], [place] [address] [employer] Electronic Payer ID: [phone] Group policy #: PERRINE    Reviewed expectations re: course of current medical issues.  Discussed self-management of symptoms.  Outlined signs and symptoms indicating need for more acute intervention.  Patient verbalized understanding and all questions were answered.  Patient received an After-Visit Summary.    Orders Placed This Encounter  Procedures  . Urinalysis, Complete   F/U q 2 years for monitoring.   Note is dictated utilizing voice recognition software. Although note has been proof read prior to signing, occasional typographical errors still can be missed. If any questions arise, please do not hesitate to call for verification.   electronically signed by:  Felix Pacini, DO  [facility]  [facility]

## 2018-09-22 NOTE — Telephone Encounter (Signed)
Please inform patient the following information: [facility] did complete the FOBT and it was negative (normal).   I have placed the order for her to have the urine collected at [facility] in 1-2 weeks ather convenience.  Please fax the requisition  to [facility]- order is placed.  We will call her once we receive those results and refer if needed   She will need to make sure [facility] knows to bill the monitoring group and not insurance.   Thanks.

## 2018-09-22 NOTE — Patient Instructions (Signed)
We will call you once we get the stool cards.  We will need to repeat the urinalysis in 1-2 weeks. We will place that order through [facility].   If blood is still present in the urine--> would refer you to urologist for further evaluation.   Follow up in 2 years for this exam.

## 2018-09-22 NOTE — Telephone Encounter (Signed)
Pt notified of lab results and when to get urine test done. Pt verbalized understanding

## 2018-10-05 ENCOUNTER — Encounter: Payer: Self-pay | Admitting: *Deleted

## 2018-10-05 LAB — MICROSCOPIC EXAMINATION
Bacteria, UA: NONE SEEN
Casts: NONE SEEN /lpf

## 2018-10-05 LAB — URINALYSIS, COMPLETE
BILIRUBIN UA: NEGATIVE
Glucose, UA: NEGATIVE
KETONES UA: NEGATIVE
Leukocytes, UA: NEGATIVE
Nitrite, UA: NEGATIVE
Protein, UA: NEGATIVE
RBC, UA: NEGATIVE
Specific Gravity, UA: 1.017 (ref 1.005–1.030)
Urobilinogen, Ur: 0.2 mg/dL (ref 0.2–1.0)
pH, UA: 5.5 (ref 5.0–7.5)

## 2019-05-24 NOTE — Progress Notes (Addendum)
Subjective:   Alexandria Rush is a 68 y.o. female who presents for an Initial Medicare Annual Wellness Visit.   I connected with patient 05/25/19 at 10:00 AM EST by a video/audio enabled telemedicine application and verified that I am speaking with the correct person using two identifiers. Patient stated full name and DOB. Patient gave permission to continue with virtual visit. Patient's location was at home and Nurse's location was at [place] office.   Review of Systems    No ROS.  Medicare Wellness Virtual Visit.  Visual/audio telehealth visit, UTA vital signs.   See social history for additional risk factors.   Cardiac Risk Factors include: advanced age (>6455men, 3>65 women);family history of premature cardiovascular disease   Sleep patterns: Sleeps 7 hours.  Home Safety/Smoke Alarms: Feels safe in home. Smoke alarms in place.  Living environment; residence and Firearm Safety: Lives with husband in 2 story home.  Seat Belt Safety/Bike Helmet: Wears seat belt.   Female:   Pap-N/A       Mammo-2016. Ordered today.        Dexa scan-2014. Ordered today.         CCS-11/05/2017, normal. Recall 5-10 years.       Objective:    Today's Vitals   05/25/19 1026  Pulse: 75  SpO2: 95%  Weight: 140 lb (63.5 kg)  Height: 5\' 3"  (1.6 m)   Body mass index is 24.8 kg/m.  Advanced Directives 05/25/2019  Does Patient Have a Medical Advance Directive? Yes  Type of Estate agentAdvance Directive Healthcare Power of [place];Living will  Copy of Healthcare Power of Attorney in Chart? No - copy requested    Current Medications (verified) Outpatient Encounter Medications as of 05/25/2019  Medication Sig  . albuterol (VENTOLIN HFA) 108 (90 Base) MCG/ACT inhaler Inhale 2 puffs into the lungs every 6 (six) hours as needed for wheezing or shortness of breath.  . famotidine (PEPCID) 20 MG tablet One at bedtime  . hydrocortisone (ANUSOL-HC) 25 MG suppository Place 25 mg rectally daily.   No  facility-administered encounter medications on file as of 05/25/2019.     Allergies (verified) Erythromycin, Influenza vaccines, Penicillins, Phenylephrine, Tetracycline, Tropicamide, Benzalkonium chloride, Lactase, Pneumococcal vaccine, Lac bovis, and Neomycin-bacitracin zn-polymyx   History: Past Medical History:  Diagnosis Date  . Asthma   . Chicken pox   . GERD (gastroesophageal reflux disease)   . Rib fracture 08/27/2017   left 6-9 rib fx   Past Surgical History:  Procedure Laterality Date  . APPENDECTOMY    . COLONOSCOPY  2019  . HYSTEROSCOPY    . NASAL SINUS SURGERY  2014  . RIB FRACTURE SURGERY  2019  . TONSILLECTOMY     Family History  Problem Relation Age of Onset  . Stomach cancer Mother   . Hypertension Father   . Heart disease Father   . Stroke Father   . Diabetes Brother   . Allergies Brother   . Asthma Brother   . Diabetes Paternal Aunt   . Hypertension Brother   . Allergies Sister   . Asthma Sister   . Diabetes Sister   . Allergies Son   . Allergies Daughter   . Cancer Maternal Aunt   . Stroke Maternal Uncle   . Cancer Paternal Aunt        breast  . Cancer Maternal Uncle        pancreatic   Social History   Socioeconomic History  . Marital status: Married    Spouse name:  Not on file  . Number of children: Not on file  . Years of education: Not on file  . Highest education level: Not on file  Occupational History  . Occupation: Herbalist: [employer]  Social Needs  . Financial resource strain: Not on file  . Food insecurity    Worry: Not on file    Inability: Not on file  . Transportation needs    Medical: Not on file    Non-medical: Not on file  Tobacco Use  . Smoking status: Never Smoker  . Smokeless tobacco: Never Used  Substance and Sexual Activity  . Alcohol use: Yes    Alcohol/week: 0.0 standard drinks    Comment: socially  . Drug use: No  . Sexual activity: Yes    Birth control/protection:  Post-menopausal    Comment: 1st intercourse 68 yo-Fewer than 5 partners  Lifestyle  . Physical activity    Days per week: Not on file    Minutes per session: Not on file  . Stress: Not on file  Relationships  . Social Herbalist on phone: Not on file    Gets together: Not on file    Attends religious service: Not on file    Active member of club or organization: Not on file    Attends meetings of clubs or organizations: Not on file    Relationship status: Not on file  Other Topics Concern  . Not on file  Social History Narrative   Marital status/children/pets: married, 2 children. From [facility]   Education/employment: masters degree. Retired- Sr. Quarry manager:      -smoke alarm in the home:Yes     - wears seatbelt: Yes     - Feels safe in their relationships: Yes    Tobacco Counseling Counseling given: Not Answered    Activities of Daily Living In your present state of health, do you have any difficulty performing the following activities: 05/25/2019  Hearing? N  Vision? N  Difficulty concentrating or making decisions? N  Walking or climbing stairs? N  Dressing or bathing? N  Doing errands, shopping? N  Preparing Food and eating ? N  Using the Toilet? N  In the past six months, have you accidently leaked urine? N  Do you have problems with loss of bowel control? N  Managing your Medications? N  Managing your Finances? N  Housekeeping or managing your Housekeeping? N  Some recent data might be hidden     Immunizations and Health Maintenance  There is no immunization history on file for this patient. Health Maintenance Due  Topic Date Due  . Hepatitis C Screening  [birth year]  . DEXA SCAN  03/23/2017  . MAMMOGRAM  07/21/2018    Patient Care Team: Ma Hillock, DO as PCP - General (Family Medicine) Tanda Rockers, MD as Consulting Physician (Pulmonary Disease) Clair Gulling, MD Clarene Essex, MD as Consulting Physician (Gastroenterology)   Indicate any recent Medical Services you may have received from other than [facility] providers in the past year (date may be approximate).     Assessment:   This is a routine wellness examination for [name].  Hearing/Vision screen  Hearing Screening   125Hz  250Hz  500Hz  1000Hz  2000Hz  3000Hz  4000Hz  6000Hz  8000Hz   Right ear:           Left ear:           Comments: Able to hear conversational tones w/o difficulty.  No issues reported.    Vision Screening Comments: Last exam 2018. With vision changes only. Wears glasses.   Dietary issues and exercise activities discussed: Current Exercise Habits: Home exercise routine(housework, gardening), Type of exercise: walking, Time (Minutes): 45, Frequency (Times/Week): 6, Weekly Exercise (Minutes/Week): 270, Exercise limited by: None identified Diet (meal preparation, eat out, water intake, caffeinated beverages, dairy products, fruits and vegetables): Drinks water. Avoids sodas and juices.   Breakfast: Skips Lunch: protein and vegetable Dinner: protein and vegetable  Goals    . Weight (lb) < 135 lb (61.2 kg)     Lose weight by increasing activity.       Depression Screen PHQ 2/9 Scores 05/25/2019 08/20/2018  PHQ - 2 Score 0 0    Fall Risk Fall Risk  05/25/2019  Falls in the past year? 0  Number falls in past yr: 0  Injury with Fall? 0  Follow up Falls prevention discussed     Screening Tests Health Maintenance  Topic Date Due  . Hepatitis C Screening  January 21, 1951  . DEXA SCAN  03/23/2017  . MAMMOGRAM  07/21/2018  . TETANUS/TDAP  08/22/2023  . COLONOSCOPY  11/06/2027  . INFLUENZA VACCINE  Discontinued  . PNA vac Low Risk Adult  Discontinued       Plan:    Schedule mammogram and bone density.   Bring a copy of your living will and/or healthcare power of attorney to your next office visit.  Continue doing brain stimulating activities (puzzles, reading, adult coloring books, staying active) to keep memory sharp.   I have  personally reviewed and noted the following in the patient's chart:   . Medical and social history . Use of alcohol, tobacco or illicit drugs  . Current medications and supplements . Functional ability and status . Nutritional status . Physical activity . Advanced directives . List of other physicians . Hospitalizations, surgeries, and ER visits in previous 12 months . Vitals . Screenings to include cognitive, depression, and falls . Referrals and appointments  In addition, I have reviewed and discussed with patient certain preventive protocols, quality metrics, and best practice recommendations. A written personalized care plan for preventive services as well as general preventive health recommendations were provided to patient.     Alysia Penna, RN   05/25/2019    PCP Notes: -Declines Shingrix, allergic to Flu/PNA -F/U with PCP 05/26/2019  Medical screening examination/treatment/procedure(s) were performed by non-physician practitioner and as supervising physician I was immediately available for consultation/collaboration.  I agree with above assessment and plan.  Electronically Signed by: Felix Pacini, DO [facility]- [place]

## 2019-05-25 ENCOUNTER — Ambulatory Visit (INDEPENDENT_AMBULATORY_CARE_PROVIDER_SITE_OTHER): Payer: Medicare Other

## 2019-05-25 ENCOUNTER — Other Ambulatory Visit: Payer: Self-pay

## 2019-05-25 VITALS — HR 75 | Ht 63.0 in | Wt 140.0 lb

## 2019-05-25 DIAGNOSIS — E2839 Other primary ovarian failure: Secondary | ICD-10-CM | POA: Diagnosis not present

## 2019-05-25 DIAGNOSIS — Z1239 Encounter for other screening for malignant neoplasm of breast: Secondary | ICD-10-CM | POA: Diagnosis not present

## 2019-05-25 DIAGNOSIS — Z0000 Encounter for general adult medical examination without abnormal findings: Secondary | ICD-10-CM

## 2019-05-25 DIAGNOSIS — Z1159 Encounter for screening for other viral diseases: Secondary | ICD-10-CM | POA: Diagnosis not present

## 2019-05-25 NOTE — Patient Instructions (Addendum)
Schedule mammogram and bone density.   Bring a copy of your living will and/or healthcare power of attorney to your next office visit.  Continue doing brain stimulating activities (puzzles, reading, adult coloring books, staying active) to keep memory sharp.    Fall Prevention in the Home, Adult Falls can cause injuries. They can happen to people of all ages. There are many things you can do to make your home safe and to help prevent falls. Ask for help when making these changes, if needed. What actions can I take to prevent falls? General Instructions  Use good lighting in all rooms. Replace any light bulbs that burn out.  Turn on the lights when you go into a dark area. Use night-lights.  Keep items that you use often in easy-to-reach places. Lower the shelves around your home if necessary.  Set up your furniture so you have a clear path. Avoid moving your furniture around.  Do not have throw rugs and other things on the floor that can make you trip.  Avoid walking on wet floors.  If any of your floors are uneven, fix them.  Add color or contrast paint or tape to clearly mark and help you see: ? Any grab bars or handrails. ? First and last steps of stairways. ? Where the edge of each step is.  If you use a stepladder: ? Make sure that it is fully opened. Do not climb a closed stepladder. ? Make sure that both sides of the stepladder are locked into place. ? Ask someone to hold the stepladder for you while you use it.  If there are any pets around you, be aware of where they are. What can I do in the bathroom?      Keep the floor dry. Clean up any water that spills onto the floor as soon as it happens.  Remove soap buildup in the tub or shower regularly.  Use non-skid mats or decals on the floor of the tub or shower.  Attach bath mats securely with double-sided, non-slip rug tape.  If you need to sit down in the shower, use a plastic, non-slip stool.  Install grab  bars by the toilet and in the tub and shower. Do not use towel bars as grab bars. What can I do in the bedroom?  Make sure that you have a light by your bed that is easy to reach.  Do not use any sheets or blankets that are too big for your bed. They should not hang down onto the floor.  Have a firm chair that has side arms. You can use this for support while you get dressed. What can I do in the kitchen?  Clean up any spills right away.  If you need to reach something above you, use a strong step stool that has a grab bar.  Keep electrical cords out of the way.  Do not use floor polish or wax that makes floors slippery. If you must use wax, use non-skid floor wax. What can I do with my stairs?  Do not leave any items on the stairs.  Make sure that you have a light switch at the top of the stairs and the bottom of the stairs. If you do not have them, ask someone to add them for you.  Make sure that there are handrails on both sides of the stairs, and use them. Fix handrails that are broken or loose. Make sure that handrails are as long as the stairways.  Install non-slip stair treads on all stairs in your home.  Avoid having throw rugs at the top or bottom of the stairs. If you do have throw rugs, attach them to the floor with carpet tape.  Choose a carpet that does not hide the edge of the steps on the stairway.  Check any carpeting to make sure that it is firmly attached to the stairs. Fix any carpet that is loose or worn. What can I do on the outside of my home?  Use bright outdoor lighting.  Regularly fix the edges of walkways and driveways and fix any cracks.  Remove anything that might make you trip as you walk through a door, such as a raised step or threshold.  Trim any bushes or trees on the path to your home.  Regularly check to see if handrails are loose or broken. Make sure that both sides of any steps have handrails.  Install guardrails along the edges of any  raised decks and porches.  Clear walking paths of anything that might make someone trip, such as tools or rocks.  Have any leaves, snow, or ice cleared regularly.  Use sand or salt on walking paths during winter.  Clean up any spills in your garage right away. This includes grease or oil spills. What other actions can I take?  Wear shoes that: ? Have a low heel. Do not wear high heels. ? Have rubber bottoms. ? Are comfortable and fit you well. ? Are closed at the toe. Do not wear open-toe sandals.  Use tools that help you move around (mobility aids) if they are needed. These include: ? Canes. ? Walkers. ? Scooters. ? Crutches.  Review your medicines with your doctor. Some medicines can make you feel dizzy. This can increase your chance of falling. Ask your doctor what other things you can do to help prevent falls. Where to find more information  Centers for Disease Control and Prevention, STEADI: https://garcia.biz/  [employer] on Aging: BrainJudge.co.uk Contact a doctor if:  You are afraid of falling at home.  You feel weak, drowsy, or dizzy at home.  You fall at home. Summary  There are many simple things that you can do to make your home safe and to help prevent falls.  Ways to make your home safe include removing tripping hazards and installing grab bars in the bathroom.  Ask for help when making these changes in your home. This information is not intended to replace advice given to you by your health care provider. Make sure you discuss any questions you have with your health care provider. Document Released: 05/03/2009 Document Revised: 10/28/2018 Document Reviewed: 02/19/2017 Elsevier Patient Education  2020 [place] Maintenance, Female Adopting a healthy lifestyle and getting preventive care are important in promoting health and wellness. Ask your health care provider about:  The right schedule for you to have regular tests and  exams.  Things you can do on your own to prevent diseases and keep yourself healthy. What should I know about diet, weight, and exercise? Eat a healthy diet   Eat a diet that includes plenty of vegetables, fruits, low-fat dairy products, and lean protein.  Do not eat a lot of foods that are high in solid fats, added sugars, or sodium. Maintain a healthy weight Body mass index (BMI) is used to identify weight problems. It estimates body fat based on height and weight. Your health care provider can help determine your BMI and help you  achieve or maintain a healthy weight. Get regular exercise Get regular exercise. This is one of the most important things you can do for your health. Most adults should:  Exercise for at least 150 minutes each week. The exercise should increase your heart rate and make you sweat (moderate-intensity exercise).  Do strengthening exercises at least twice a week. This is in addition to the moderate-intensity exercise.  Spend less time sitting. Even light physical activity can be beneficial. Watch cholesterol and blood lipids Have your blood tested for lipids and cholesterol at 67 years of age, then have this test every 5 years. Have your cholesterol levels checked more often if:  Your lipid or cholesterol levels are high.  You are older than 67 years of age.  You are at high risk for heart disease. What should I know about cancer screening? Depending on your health history and family history, you may need to have cancer screening at various ages. This may include screening for:  Breast cancer.  Cervical cancer.  Colorectal cancer.  Skin cancer.  Lung cancer. What should I know about heart disease, diabetes, and high blood pressure? Blood pressure and heart disease  High blood pressure causes heart disease and increases the risk of stroke. This is more likely to develop in people who have high blood pressure readings, are of African descent, or are  overweight.  Have your blood pressure checked: ? Every 3-5 years if you are 62-41 years of age. ? Every year if you are 108 years old or older. Diabetes Have regular diabetes screenings. This checks your fasting blood sugar level. Have the screening done:  Once every three years after age 53 if you are at a normal weight and have a low risk for diabetes.  More often and at a younger age if you are overweight or have a high risk for diabetes. What should I know about preventing infection? Hepatitis B If you have a higher risk for hepatitis B, you should be screened for this virus. Talk with your health care provider to find out if you are at risk for hepatitis B infection. Hepatitis C Testing is recommended for:  Everyone born from [birth year] through 1964.  Anyone with known risk factors for hepatitis C. Sexually transmitted infections (STIs)  Get screened for STIs, including gonorrhea and chlamydia, if: ? You are sexually active and are younger than 67 years of age. ? You are older than 67 years of age and your health care provider tells you that you are at risk for this type of infection. ? Your sexual activity has changed since you were last screened, and you are at increased risk for chlamydia or gonorrhea. Ask your health care provider if you are at risk.  Ask your health care provider about whether you are at high risk for HIV. Your health care provider may recommend a prescription medicine to help prevent HIV infection. If you choose to take medicine to prevent HIV, you should first get tested for HIV. You should then be tested every 3 months for as long as you are taking the medicine. Pregnancy  If you are about to stop having your period (premenopausal) and you may become pregnant, seek counseling before you get pregnant.  Take 400 to 800 micrograms (mcg) of folic acid every day if you become pregnant.  Ask for birth control (contraception) if you want to prevent pregnancy.  Osteoporosis and menopause Osteoporosis is a disease in which the bones lose minerals and strength with  aging. This can result in bone fractures. If you are 29 years old or older, or if you are at risk for osteoporosis and fractures, ask your health care provider if you should:  Be screened for bone loss.  Take a calcium or vitamin D supplement to lower your risk of fractures.  Be given hormone replacement therapy (HRT) to treat symptoms of menopause. Follow these instructions at home: Lifestyle  Do not use any products that contain nicotine or tobacco, such as cigarettes, e-cigarettes, and chewing tobacco. If you need help quitting, ask your health care provider.  Do not use street drugs.  Do not share needles.  Ask your health care provider for help if you need support or information about quitting drugs. Alcohol use  Do not drink alcohol if: ? Your health care provider tells you not to drink. ? You are pregnant, may be pregnant, or are planning to become pregnant.  If you drink alcohol: ? Limit how much you use to 0-1 drink a day. ? Limit intake if you are breastfeeding.  Be aware of how much alcohol is in your drink. In the [place] one drink equals one 12 oz bottle of beer (355 mL), one 5 oz glass of wine (148 mL), or one 1 oz glass of hard liquor (44 mL). General instructions  Schedule regular health, dental, and eye exams.  Stay current with your vaccines.  Tell your health care provider if: ? You often feel depressed. ? You have ever been abused or do not feel safe at home. Summary  Adopting a healthy lifestyle and getting preventive care are important in promoting health and wellness.  Follow your health care provider's instructions about healthy diet, exercising, and getting tested or screened for diseases.  Follow your health care provider's instructions on monitoring your cholesterol and blood pressure. This information is not intended to replace advice given  to you by your health care provider. Make sure you discuss any questions you have with your health care provider. Document Released: 01/20/2011 Document Revised: 06/30/2018 Document Reviewed: 06/30/2018 Elsevier Patient Education  2020 [employer].

## 2019-05-26 ENCOUNTER — Ambulatory Visit (INDEPENDENT_AMBULATORY_CARE_PROVIDER_SITE_OTHER): Payer: Medicare Other | Admitting: Family Medicine

## 2019-05-26 ENCOUNTER — Encounter: Payer: Self-pay | Admitting: Family Medicine

## 2019-05-26 VITALS — BP 144/98 | HR 75 | Temp 97.6°F | Ht 62.25 in | Wt 149.0 lb

## 2019-05-26 DIAGNOSIS — K219 Gastro-esophageal reflux disease without esophagitis: Secondary | ICD-10-CM

## 2019-05-26 DIAGNOSIS — Z0001 Encounter for general adult medical examination with abnormal findings: Secondary | ICD-10-CM

## 2019-05-26 DIAGNOSIS — I10 Essential (primary) hypertension: Secondary | ICD-10-CM

## 2019-05-26 DIAGNOSIS — Z1159 Encounter for screening for other viral diseases: Secondary | ICD-10-CM | POA: Diagnosis not present

## 2019-05-26 DIAGNOSIS — E663 Overweight: Secondary | ICD-10-CM | POA: Diagnosis not present

## 2019-05-26 DIAGNOSIS — J4520 Mild intermittent asthma, uncomplicated: Secondary | ICD-10-CM

## 2019-05-26 DIAGNOSIS — Z01419 Encounter for gynecological examination (general) (routine) without abnormal findings: Secondary | ICD-10-CM

## 2019-05-26 DIAGNOSIS — Z131 Encounter for screening for diabetes mellitus: Secondary | ICD-10-CM

## 2019-05-26 LAB — COMPREHENSIVE METABOLIC PANEL
ALT: 10 U/L (ref 0–35)
AST: 13 U/L (ref 0–37)
Albumin: 4.2 g/dL (ref 3.5–5.2)
Alkaline Phosphatase: 123 U/L — ABNORMAL HIGH (ref 39–117)
BUN: 15 mg/dL (ref 6–23)
CO2: 27 mEq/L (ref 19–32)
Calcium: 8.9 mg/dL (ref 8.4–10.5)
Chloride: 106 mEq/L (ref 96–112)
Creatinine, Ser: 0.67 mg/dL (ref 0.40–1.20)
GFR: 87.75 mL/min (ref >60.00)
Glucose, Bld: 89 mg/dL (ref 70–99)
Potassium: 4.1 mEq/L (ref 3.5–5.1)
Sodium: 141 mEq/L (ref 135–145)
Total Bilirubin: 0.5 mg/dL (ref 0.2–1.2)
Total Protein: 6.5 g/dL (ref 6.0–8.3)

## 2019-05-26 LAB — CBC
HCT: 38.3 % (ref 36.0–46.0)
Hemoglobin: 12.5 g/dL (ref 12.0–15.0)
MCHC: 32.7 g/dL (ref 30.0–36.0)
MCV: 97.3 fl (ref 78.0–100.0)
Platelets: 230 10*3/uL (ref 150.0–400.0)
RBC: 3.94 Mil/uL (ref 3.87–5.11)
RDW: 14 % (ref 11.5–15.5)
WBC: 5.4 10*3/uL (ref 4.0–10.5)

## 2019-05-26 LAB — LIPID PANEL
Cholesterol: 186 mg/dL (ref 0–200)
HDL: 49.3 mg/dL (ref >39.00)
LDL Cholesterol: 118 mg/dL — ABNORMAL HIGH (ref 0–99)
NonHDL: 136.95
Total CHOL/HDL Ratio: 4
Triglycerides: 94 mg/dL (ref 0.0–149.0)
VLDL: 18.8 mg/dL (ref 0.0–40.0)

## 2019-05-26 LAB — TSH: TSH: 1.67 u[IU]/mL (ref 0.35–4.50)

## 2019-05-26 LAB — HEMOGLOBIN A1C: Hgb A1c MFr Bld: 5.7 % (ref 4.6–6.5)

## 2019-05-26 MED ORDER — ALBUTEROL SULFATE HFA 108 (90 BASE) MCG/ACT IN AERS: 2.0000 | INHALATION_SPRAY | Freq: Four times a day (QID) | RESPIRATORY_TRACT | 11 refills | Status: DC | PRN

## 2019-05-26 MED ORDER — AMLODIPINE BESYLATE 2.5 MG PO TABS: 2.5000 mg | ORAL_TABLET | Freq: Every day | ORAL | 0 refills | Status: DC

## 2019-05-26 NOTE — Progress Notes (Signed)
Patient ID: Alexandria Rush, female  DOB: 1951/02/18, 68 y.o.   MRN: [id number] Patient Care Team    Relationship Specialty Notifications Start End  Alexandria Hillock, DO PCP - General Family Medicine  08/20/18   Alexandria Rockers, MD Consulting Physician Pulmonary Disease  08/20/18   Alexandria Gulling, MD    08/20/18    Comment: thoracic surgeon  Alexandria Essex, MD Consulting Physician Gastroenterology  08/20/18     Chief Complaint  Patient presents with  . Annual Exam    Fasting. Mammogram ordered. Pap smear 2017. Would like referral to GYN.     Subjective:  Alexandria Rush is a 68 y.o.  Female  present for CPE. All past medical history, surgical history, allergies, family history, immunizations, medications and social history were updated in the electronic medical record today. All recent labs, ED visits and hospitalizations within the last year were reviewed.  Hypertension/obesity:  Elevated x2 today. Blood pressures ranges at home are checked, but hse is having cuff errors. Patient denies chest pain, shortness of breath or lower extremity edema. Pt does not take a daily baby ASA. Pt is not prescribed statin. BMP: collected today CBC: collected today TSH: collected today Lipid: collected today RF: HTN,  Fhx HD. BMI >25  Gastroesophageal reflux disease, unspecified whether esophagitis present Only takes pepcid as needed. Provided by pulm. 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 Uses albuterol when needed only.   Health maintenance:  Colonoscopy: completed 2019, by [facility], f/u - 5-10 years Mammogram: completed:05/2017, >> ordered 05/25/2019 Cervical cancer screening: N/A. Pt would like referral to GYN>> referral placed.  Immunizations: tdap UTD 2015, Influenza declined (encouraged yearly), PNA series declined, shingrix allergy- declined.  Infectious disease screening: Hep C agreed to testing.  DEXA: ordered  11/4.  Assistive device: none Oxygen PPI:RJJO Patient has a Dental home. Hospitalizations/ED visits: reviewed  Depression screen [facility] 2/9 05/26/2019 05/25/2019 08/20/2018  Decreased Interest 0 0 0  Down, Depressed, Hopeless 0 0 0  PHQ - 2 Score 0 0 0   No flowsheet data found.   There is no immunization history on file for this patient.   Past Medical History:  Diagnosis Date  . Asthma   . Chicken pox   . GERD (gastroesophageal reflux disease)   . Rib fracture 08/27/2017   left 6-9 rib fx   Allergies  Allergen Reactions  . Erythromycin Anaphylaxis  . Influenza Vaccines Anaphylaxis  . Penicillins Anaphylaxis  . Phenylephrine Anaphylaxis  . Tetracycline Anaphylaxis  . Tropicamide Swelling  . Benzalkonium Chloride Rash  . Lactase     Other reaction(s): Wheezing Other reaction(s): Wheezing   . Pneumococcal Vaccine Other (See Comments)    Pt doesn't take this vaccine as a precaution due to other allergens she has. Other reaction(s): Other (See Comments) Pt doesn't take this vaccine as a precaution due to other allergens she has.   . Lac Bovis Nausea And Vomiting  . Neomycin-Bacitracin Zn-Polymyx Rash   Past Surgical History:  Procedure Laterality Date  . APPENDECTOMY    . COLONOSCOPY  2019  . HYSTEROSCOPY    . NASAL SINUS SURGERY  2014  . RIB FRACTURE SURGERY  2019  . TONSILLECTOMY     Family History  Problem Relation Age of Onset  . Stomach cancer Mother   . Hypertension Father   . Heart disease Father   . Stroke Father   . Diabetes Brother   .  Allergies Brother   . Asthma Brother   . Diabetes Paternal Aunt   . Hypertension Brother   . Allergies Sister   . Asthma Sister   . Diabetes Sister   . Allergies Son   . Allergies Daughter   . Cancer Maternal Aunt   . Stroke Maternal Uncle   . Cancer Paternal Aunt        breast  . Cancer Maternal Uncle        pancreatic   Social History   Social History Narrative   Marital status/children/pets: married,  2 children. From [facility]   Education/employment: masters degree. Retired- Sr. Quarry manager:      -smoke alarm in the home:Yes     - wears seatbelt: Yes     - Feels safe in their relationships: Yes    Allergies as of 05/26/2019      Reactions   Erythromycin Anaphylaxis   Influenza Vaccines Anaphylaxis   Penicillins Anaphylaxis   Phenylephrine Anaphylaxis   Tetracycline Anaphylaxis   Tropicamide Swelling   Benzalkonium Chloride Rash   Lactase    Other reaction(s): Wheezing Other reaction(s): Wheezing   Pneumococcal Vaccine Other (See Comments)   Pt doesn't take this vaccine as a precaution due to other allergens she has. Other reaction(s): Other (See Comments) Pt doesn't take this vaccine as a precaution due to other allergens she has.   Lac Bovis Nausea And Vomiting   Neomycin-bacitracin Zn-polymyx Rash      Medication List       Accurate as of May 26, 2019  8:52 AM. If you have any questions, ask your nurse or doctor.        albuterol 108 (90 Base) MCG/ACT inhaler Commonly known as: Ventolin HFA Inhale 2 puffs into the lungs every 6 (six) hours as needed for wheezing or shortness of breath.   famotidine 20 MG tablet Commonly known as: Pepcid One at bedtime   hydrocortisone 25 MG suppository Commonly known as: ANUSOL-HC Place 25 mg rectally daily.       All past medical history, surgical history, allergies, family history, immunizations andmedications were updated in the EMR today and reviewed under the history and medication portions of their EMR.     No results found for this or any previous visit (from the past 2160 hour(s)).  ROS: 14 pt review of systems performed and negative (unless mentioned in an HPI)  Objective: BP (!) 144/98 (BP Location: Left Arm, Patient Position: Sitting, Cuff Size: Normal)   Pulse 75   Temp 97.6 F (36.4 C) (Temporal)   Ht 5' 2.25" (1.581 m)   Wt 149 lb (67.6 kg)   SpO2 100%   BMI 27.03 kg/m  Gen: Afebrile. No  acute distress. Nontoxic in appearance, well-developed, well-nourished, very pleasant, mildly overweight, Caucasian female. HENT: AT. [place]. Bilateral TM visualized and normal in appearance, normal external auditory canal. MMM, no oral lesions, adequate dentition. Bilateral nares within normal limits. Throat without erythema, ulcerations or exudates.  No cough on exam, no hoarseness on exam. Eyes:Pupils Equal Round Reactive to light, Extraocular movements intact,  Conjunctiva without redness, discharge or icterus. Neck/lymp/endocrine: Supple, no lymphadenopathy, no thyromegaly CV: RRR no murmur, no edema, +2/4 P posterior tibialis pulses.  No carotid bruits. No JVD. Chest: CTAB, no wheeze, rhonchi or crackles. normal Respiratory effort.  Good air movement. Abd: Soft.  Flat. NTND. BS present.  No masses palpated. No hepatosplenomegaly. No rebound tenderness or guarding. Skin: No rashes, purpura or petechiae. Warm  and well-perfused. Skin intact. Neuro/Msk:  Normal gait. PERLA. EOMi. Alert. Oriented x3.  Cranial nerves II through XII intact. Muscle strength 5/5 upper/lower extremity. DTRs equal bilaterally. Psych: Normal affect, dress and demeanor. Normal speech. Normal thought content and judgment.   No exam data present  Assessment/plan: SALIMA RUMER is a 68 y.o. female present for CPE  Overweight (BMI 25.0-29.9) Diet and exercise modification discussed  - Lipid panel Need for hepatitis C screening test - Hepatitis C Antibody  Benign essential HTN New onset. Discussed options with her today and she is agreeable to start low dose amlodipine.  - proper BP management discussed. Goal <130/80. instruction on how to take BP at home provided.  Low sodium diet. Routine exercise.  - CBC - Comp Met (CMET) - Lipid panel - TSH - f/u 4 weeks. Once stable q 6 mos follow up.   Gastroesophageal reflux disease, unspecified whether esophagitis present Uses pepcid as needed only. Script provided by Dr.  Wende Mott.   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  Refilled albuterol inhaler today.   Diabetes mellitus screening - HgB A1c  Encounter for preventative adult health care exam with abnormal findings Patient was encouraged to exercise greater than 150 minutes a week. Patient was encouraged to choose a diet filled with fresh fruits and vegetables, and lean meats. AVS provided to patient today for education/recommendation on gender specific health and safety maintenance. Colonoscopy: completed 2019, by [facility], f/u - 5-10 years Mammogram: completed:05/2017, >> ordered 05/25/2019 Cervical cancer screening: N/A. Pt would like referral to GYN>> referral placed.  Immunizations: tdap UTD 2015, Influenza declined (encouraged yearly), PNA series declined, shingrix allergy- declined.  Infectious disease screening: Hep C agreed to testing.  DEXA: ordered 11/4.   Return in about 1 year (around 05/25/2020) for CPE (30 min).   Annual physical completed today as well as additional  > 10 minutes spent with patient, > 50% of that time face to face diagnosing, discussing and providing tx for new onset HTN.   Electronically signed by: Howard Pouch, DO [place]

## 2019-05-26 NOTE — Patient Instructions (Signed)
Health Maintenance After Age 67 After age 67, you are at a higher risk for certain long-term diseases and infections as well as injuries from falls. Falls are a major cause of broken bones and head injuries in people who are older than age 67. Getting regular preventive care can help to keep you healthy and well. Preventive care includes getting regular testing and making lifestyle changes as recommended by your health care provider. Talk with your health care provider about:  Which screenings and tests you should have. A screening is a test that checks for a disease when you have no symptoms.  A diet and exercise plan that is right for you. What should I know about screenings and tests to prevent falls? Screening and testing are the best ways to find a health problem early. Early diagnosis and treatment give you the best chance of managing medical conditions that are common after age 67. Certain conditions and lifestyle choices may make you more likely to have a fall. Your health care provider may recommend:  Regular vision checks. Poor vision and conditions such as cataracts can make you more likely to have a fall. If you wear glasses, make sure to get your prescription updated if your vision changes.  Medicine review. Work with your health care provider to regularly review all of the medicines you are taking, including over-the-counter medicines. Ask your health care provider about any side effects that may make you more likely to have a fall. Tell your health care provider if any medicines that you take make you feel dizzy or sleepy.  Osteoporosis screening. Osteoporosis is a condition that causes the bones to get weaker. This can make the bones weak and cause them to break more easily.  Blood pressure screening. Blood pressure changes and medicines to control blood pressure can make you feel dizzy.  Strength and balance checks. Your health care provider may recommend certain tests to check your  strength and balance while standing, walking, or changing positions.  Foot health exam. Foot pain and numbness, as well as not wearing proper footwear, can make you more likely to have a fall.  Depression screening. You may be more likely to have a fall if you have a fear of falling, feel emotionally low, or feel unable to do activities that you used to do.  Alcohol use screening. Using too much alcohol can affect your balance and may make you more likely to have a fall. What actions can I take to lower my risk of falls? General instructions  Talk with your health care provider about your risks for falling. Tell your health care provider if: ? You fall. Be sure to tell your health care provider about all falls, even ones that seem minor. ? You feel dizzy, sleepy, or off-balance.  Take over-the-counter and prescription medicines only as told by your health care provider. These include any supplements.  Eat a healthy diet and maintain a healthy weight. A healthy diet includes low-fat dairy products, low-fat (lean) meats, and fiber from whole grains, beans, and lots of fruits and vegetables. Home safety  Remove any tripping hazards, such as rugs, cords, and clutter.  Install safety equipment such as grab bars in bathrooms and safety rails on stairs.  Keep rooms and walkways well-lit. Activity   Follow a regular exercise program to stay fit. This will help you maintain your balance. Ask your health care provider what types of exercise are appropriate for you.  If you need a cane or   walker, use it as recommended by your health care provider.  Wear supportive shoes that have nonskid soles. Lifestyle  Do not drink alcohol if your health care provider tells you not to drink.  If you drink alcohol, limit how much you have: ? 0-1 drink a day for women. ? 0-2 drinks a day for men.  Be aware of how much alcohol is in your drink. In the [place] one drink equals one typical bottle of beer (12  oz), one-half glass of wine (5 oz), or one shot of hard liquor (1 oz).  Do not use any products that contain nicotine or tobacco, such as cigarettes and e-cigarettes. If you need help quitting, ask your health care provider. Summary  Having a healthy lifestyle and getting preventive care can help to protect your health and wellness after age 67.  Screening and testing are the best way to find a health problem early and help you avoid having a fall. Early diagnosis and treatment give you the best chance for managing medical conditions that are more common for people who are older than age 67.  Falls are a major cause of broken bones and head injuries in people who are older than age 67. Take precautions to prevent a fall at home.  Work with your health care provider to learn what changes you can make to improve your health and wellness and to prevent falls. This information is not intended to replace advice given to you by your health care provider. Make sure you discuss any questions you have with your health care provider. Document Released: 05/20/2017 Document Revised: 10/28/2018 Document Reviewed: 05/20/2017 Elsevier Patient Education  2020 Else[employer] Inc.  

## 2019-05-27 ENCOUNTER — Other Ambulatory Visit: Payer: Self-pay

## 2019-05-27 ENCOUNTER — Telehealth: Payer: Self-pay | Admitting: Family Medicine

## 2019-05-27 LAB — HEPATITIS C ANTIBODY
Hepatitis C Ab: NONREACTIVE
SIGNAL TO CUT-OFF: 0.01 (ref <1.00)

## 2019-05-27 NOTE — Telephone Encounter (Signed)
Please inform patient the following information: Her labs all look good and are normal with the exception of an enzyme called alkaline phosphatase.  This particular enzyme can be elevated for many reasons that include causes from the liver, medications, gallbladder and even bone.  Her level is just barely elevated at 123, with normal less than 117.  Her cholesterol cholesterol, blood counts, diabetes screen, thyroid and hepatitis C screenings were all negative or normal.  Please schedule her for follow-up in 4 weeks on the new blood pressure medicine we started and we will repeat her lab for this enzyme at that time to see if it has returned to normal or needs further studies.

## 2019-05-27 NOTE — Telephone Encounter (Signed)
Pt was called and given information, she verbalized understanding. Pt was scheduled for appt

## 2019-05-31 ENCOUNTER — Ambulatory Visit: Payer: Medicare Other | Admitting: Obstetrics and Gynecology

## 2019-05-31 ENCOUNTER — Other Ambulatory Visit (HOSPITAL_COMMUNITY)
Admission: RE | Admit: 2019-05-31 | Discharge: 2019-05-31 | Disposition: A | Discharge status: Home / Self Care | Payer: Medicare Other | Source: Ambulatory Visit | Attending: Obstetrics and Gynecology | Admitting: Obstetrics and Gynecology

## 2019-05-31 ENCOUNTER — Other Ambulatory Visit: Payer: Self-pay

## 2019-05-31 ENCOUNTER — Encounter: Payer: Self-pay | Admitting: Obstetrics and Gynecology

## 2019-05-31 VITALS — BP 130/82 | HR 70 | Temp 97.4°F | Resp 16 | Ht 62.5 in | Wt 149.4 lb

## 2019-05-31 DIAGNOSIS — Z01419 Encounter for gynecological examination (general) (routine) without abnormal findings: Secondary | ICD-10-CM | POA: Diagnosis present

## 2019-05-31 NOTE — Patient Instructions (Signed)

## 2019-05-31 NOTE — Progress Notes (Signed)
68 y.o. G2P2 Married [place] female here for annual exam.    Having external itching for a few months, that comes and goes.  Denies vaginal bleeding.   Hx an ovarian cyst 20 - 30 years ago, which resolved.  Hx some UTIs, but not frequently.   No bladder control issues.   She states she has regular bowel movements.  From [place]. Living in [place] for 24 years.  5 grandchildren.  PCP:  Howard Pouch, DO  Patient's last menstrual period was 07/21/2008 (approximate).           Sexually active: Yes.    The current method of family planning is post menopausal status.    Exercising: Yes.    walking Smoker:  no  Health Maintenance: Pap: 2016 normal History of abnormal Pap:  no MMG: Has appt. 06-03-19 Colonoscopy: 2019 normal;next 5-10 years BMD: appt. 06-03-19    TDaP: Unsure.  She declines.  Gardasil:   no HIV: unsure Hep C: Neg last week with PCP Screening Labs:  PCP.    reports that she has never smoked. She has never used smokeless tobacco. She reports current alcohol use. She reports that she does not use drugs.  Past Medical History:  Diagnosis Date  . Anemia   . Asthma   . Chicken pox   . GERD (gastroesophageal reflux disease)   . Hypertension   . MVP (mitral valve prolapse)   . Rib fracture 08/27/2017   left 6-9 rib fx- seen of f/u CT- xray was normal    Past Surgical History:  Procedure Laterality Date  . APPENDECTOMY    . COLONOSCOPY  2019  . HYSTEROSCOPY    . NASAL SINUS SURGERY  2014  . RIB FRACTURE SURGERY  2019  . TONSILLECTOMY      Current Outpatient Medications  Medication Sig Dispense Refill  . albuterol (VENTOLIN HFA) 108 (90 Base) MCG/ACT inhaler Inhale 2 puffs into the lungs every 6 (six) hours as needed for wheezing or shortness of breath. 8 g 11  . amLODipine (NORVASC) 2.5 MG tablet Take 1 tablet (2.5 mg total) by mouth daily. 30 tablet 0  . EPINEPHrine (EPIPEN 2-PAK) 0.3 mg/0.3 mL IJ SOAJ injection Inject 0.3 mg into the muscle as  needed for anaphylaxis.    . famotidine (PEPCID) 20 MG tablet One at bedtime 30 tablet 2   No current facility-administered medications for this visit.     Family History  Problem Relation Age of Onset  . Stomach cancer Mother   . Cancer Mother   . Hypertension Father   . Heart disease Father   . Stroke Father   . Diabetes Brother   . Allergies Brother   . Asthma Brother   . Diabetes Paternal Aunt   . Hypertension Brother   . Allergies Sister   . Asthma Sister   . Diabetes Sister   . Allergies Son   . Allergies Daughter   . Cancer Maternal Aunt   . Stroke Maternal Uncle   . Cancer Paternal Aunt        breast  . Breast cancer Paternal Aunt   . Cancer Maternal Uncle        pancreatic    Review of Systems  All other systems reviewed and are negative.   Exam:   BP 130/82   Pulse 70   Temp (!) 97.4 F (36.3 C) (Temporal)   Resp 16   Ht 5' 2.5" (1.588 m)   Wt 149 lb 6.4 oz (  67.8 kg)   LMP 07/21/2008 (Approximate)   BMI 26.89 kg/m     General appearance: alert, cooperative and appears stated age Head: normocephalic, without obvious abnormality, atraumatic Neck: no adenopathy, supple, symmetrical, trachea midline and thyroid normal to inspection and palpation Lungs: clear to auscultation bilaterally Breasts: normal appearance, no masses or tenderness, No nipple retraction or dimpling, No nipple discharge or bleeding, No axillary adenopathy Heart: regular rate and rhythm Abdomen: soft, non-tender; no masses, no organomegaly Extremities: extremities normal, atraumatic, no cyanosis or edema Skin: skin color, texture, turgor normal. No rashes or lesions Lymph nodes: cervical, supraclavicular, and axillary nodes normal. Neurologic: grossly normal  Pelvic: External genitalia:  no lesions              No abnormal inguinal nodes palpated.              Urethra:  normal appearing urethra with no masses, tenderness or lesions              Bartholins and Skenes: normal                  Vagina: normal appearing vagina with normal color and discharge, no lesions              Cervix: no lesions              Pap taken: Yes.   Bimanual Exam:  Uterus:  normal size, contour, position, consistency, mobility, non-tender              Adnexa: no mass, fullness, tenderness              Rectal exam: Yes.  .  Confirms.              Anus:  normal sphincter tone, no lesions  Chaperone was present for exam.  Assessment:   Well woman visit with normal exam.   Plan: Mammogram screening discussed. Self breast awareness reviewed. Pap and HR HPV as above. Guidelines for Calcium, Vitamin D, regular exercise program including cardiovascular and weight bearing exercise. BMD ordered by PCP.  Follow up annually and prn.   After visit summary provided.

## 2019-06-02 LAB — CYTOLOGY - PAP: Diagnosis: NEGATIVE

## 2019-06-03 ENCOUNTER — Other Ambulatory Visit: Payer: Self-pay

## 2019-06-03 ENCOUNTER — Ambulatory Visit (HOSPITAL_BASED_OUTPATIENT_CLINIC_OR_DEPARTMENT_OTHER)
Admission: RE | Admit: 2019-06-03 | Discharge: 2019-06-03 | Disposition: A | Discharge status: Home / Self Care | Payer: Medicare Other | Source: Ambulatory Visit | Attending: Family Medicine | Admitting: Family Medicine

## 2019-06-03 DIAGNOSIS — E2839 Other primary ovarian failure: Secondary | ICD-10-CM

## 2019-06-03 DIAGNOSIS — Z1231 Encounter for screening mammogram for malignant neoplasm of breast: Secondary | ICD-10-CM | POA: Insufficient documentation

## 2019-06-03 DIAGNOSIS — M810 Age-related osteoporosis without current pathological fracture: Secondary | ICD-10-CM | POA: Diagnosis not present

## 2019-06-03 DIAGNOSIS — Z1239 Encounter for other screening for malignant neoplasm of breast: Secondary | ICD-10-CM

## 2019-06-06 ENCOUNTER — Telehealth: Payer: Self-pay | Admitting: Family Medicine

## 2019-06-06 DIAGNOSIS — M810 Age-related osteoporosis without current pathological fracture: Secondary | ICD-10-CM | POA: Insufficient documentation

## 2019-06-06 MED ORDER — ALENDRONATE SODIUM 70 MG PO TABS: 70.0000 mg | ORAL_TABLET | ORAL | 11 refills | Status: DC

## 2019-06-06 NOTE — Telephone Encounter (Signed)
Pt was called and given results/information, she verbalizes understanding. She states she will start Fosamax.

## 2019-06-06 NOTE — Telephone Encounter (Signed)
Please inform patient the following information: Her bone density resulted with osteoporosis.  I am still waiting on her mammogram results.  First-line therapy for osteoporosis is Fosamax.  Which is taken once weekly on an empty stomach, with a full glass of water.  Patient then must remain upright for 30 minutes, which simply means she cannot lay down for 30 minutes after taking medication.  I have called this in for her if she would like to start.  Would encourage her to start weightbearing exercises, even walking on a hard surface for 15 minutes a day can be beneficial to brown growth.  If she is not taking vitamin D start 1000 units daily.  If she is not taking a calcium supplement, and is able to tolerate calcium or has never been told not to take calcium in the past, she should attempt to either consume by diet or supplement calcium of 1200 mg daily.

## 2019-06-24 ENCOUNTER — Other Ambulatory Visit: Payer: Self-pay

## 2019-06-24 ENCOUNTER — Ambulatory Visit: Payer: Medicare Other | Admitting: Family Medicine

## 2019-06-24 ENCOUNTER — Encounter: Payer: Self-pay | Admitting: Family Medicine

## 2019-06-24 VITALS — BP 130/84 | HR 70 | Temp 98.1°F | Resp 17 | Ht 63.0 in | Wt 146.2 lb

## 2019-06-24 DIAGNOSIS — I10 Essential (primary) hypertension: Secondary | ICD-10-CM

## 2019-06-24 DIAGNOSIS — M810 Age-related osteoporosis without current pathological fracture: Secondary | ICD-10-CM

## 2019-06-24 DIAGNOSIS — R748 Abnormal levels of other serum enzymes: Secondary | ICD-10-CM | POA: Diagnosis not present

## 2019-06-24 LAB — COMPREHENSIVE METABOLIC PANEL
ALT: 9 U/L (ref 0–35)
AST: 12 U/L (ref 0–37)
Albumin: 4.2 g/dL (ref 3.5–5.2)
Alkaline Phosphatase: 131 U/L — ABNORMAL HIGH (ref 39–117)
BUN: 17 mg/dL (ref 6–23)
CO2: 27 mEq/L (ref 19–32)
Calcium: 9 mg/dL (ref 8.4–10.5)
Chloride: 105 mEq/L (ref 96–112)
Creatinine, Ser: 0.7 mg/dL (ref 0.40–1.20)
GFR: 83.4 mL/min (ref >60.00)
Glucose, Bld: 87 mg/dL (ref 70–99)
Potassium: 4.5 mEq/L (ref 3.5–5.1)
Sodium: 140 mEq/L (ref 135–145)
Total Bilirubin: 0.5 mg/dL (ref 0.2–1.2)
Total Protein: 6.5 g/dL (ref 6.0–8.3)

## 2019-06-24 LAB — VITAMIN D 25 HYDROXY (VIT D DEFICIENCY, FRACTURES): VITD: 26.06 ng/mL — ABNORMAL LOW (ref 30.00–100.00)

## 2019-06-24 MED ORDER — AMLODIPINE BESYLATE 2.5 MG PO TABS: 2.5000 mg | ORAL_TABLET | Freq: Every day | ORAL | 1 refills | Status: DC

## 2019-06-24 NOTE — Progress Notes (Signed)
This visit occurred during the SARS-CoV-2 public health emergency.  Safety protocols were in place, including screening questions prior to the visit, additional usage of staff PPE, and extensive cleaning of exam room while observing appropriate contact time as indicated for disinfecting solutions.    Alexandria Rush , [birth year], 68 y.o., female MRN: [id number] Patient Care Team    Relationship Specialty Notifications Start End  Ma Hillock, DO PCP - General Family Medicine  08/20/18   Tanda Rockers, MD Consulting Physician Pulmonary Disease  08/20/18   Clair Gulling, MD    08/20/18    Comment: thoracic surgeon  Clarene Essex, MD Consulting Physician Gastroenterology  08/20/18     Chief Complaint  Patient presents with  . Hypertension    Pt is here for a follow up for lab work and HTN. Pt has log of BP that she has taken at home. Pt takes BP meds at night.      Subjective: Pt presents for an OV for new onset HTN follow  Hypertension:  Pt reports compliance with amlodipine 2.5 mg daily. Blood pressures ranges at home within normal limits. Patient denies chest pain, shortness of breath or lower extremity edema. Pt does not take a daily baby ASA. Pt is is not prescribed statin. Diet: Low-sodium Exercise: Routine exercise.   Osteoporosis: DEXA with newfound osteoporosis.  Patient also had elevated alkaline phosphatase on labs collected 4 weeks ago.  She has started the vitamin D and calcium, as well as Fosamax for her osteoporosis 4 weeks ago.  She is tolerating the Fosamax.  She has no nausea, vomiting, abdominal pain.  Depression screen [facility] 2/9 05/26/2019 05/25/2019 08/20/2018  Decreased Interest 0 0 0  Down, Depressed, Hopeless 0 0 0  PHQ - 2 Score 0 0 0    Allergies  Allergen Reactions  . Erythromycin Anaphylaxis  . Influenza Vaccines Anaphylaxis  . Penicillins Anaphylaxis  . Phenylephrine Anaphylaxis  . Tetracycline Anaphylaxis  . Tropicamide Swelling  . Benzalkonium  Chloride Rash  . Lactase     Other reaction(s): Wheezing Other reaction(s): Wheezing   . Pneumococcal Vaccine Other (See Comments)    Pt doesn't take this vaccine as a precaution due to other allergens she has. Other reaction(s): Other (See Comments) Pt doesn't take this vaccine as a precaution due to other allergens she has.   . Lac Bovis Nausea And Vomiting  . Neomycin-Bacitracin Zn-Polymyx Rash   Social History   Social History Narrative   Marital status/children/pets: married, 2 children. From [facility]   Education/employment: masters degree. Retired- Sr. 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 Anemia   . Asthma   . Chicken pox   . GERD (gastroesophageal reflux disease)   . Hypertension   . MVP (mitral valve prolapse)   . Rib fracture 08/27/2017   left 6-9 rib fx- seen of f/u CT- xray was normal   Past Surgical History:  Procedure Laterality Date  . APPENDECTOMY    . COLONOSCOPY  2019  . HYSTEROSCOPY    . NASAL SINUS SURGERY  2014  . RIB FRACTURE SURGERY  2019  . TONSILLECTOMY     Family History  Problem Relation Age of Onset  . Stomach cancer Mother   . Cancer Mother   .  Hypertension Father   . Heart disease Father   . Stroke Father   . Diabetes Brother   . Allergies Brother   . Asthma Brother   . Diabetes Paternal Aunt   . Hypertension Brother   . Allergies Sister   . Asthma Sister   . Diabetes Sister   . Allergies Son   . Allergies Daughter   . Cancer Maternal Aunt   . Stroke Maternal Uncle   . Cancer Paternal Aunt        breast  . Breast cancer Paternal Aunt   . Cancer Maternal Uncle        pancreatic   Allergies as of 06/24/2019      Reactions   Erythromycin Anaphylaxis   Influenza Vaccines Anaphylaxis   Penicillins Anaphylaxis   Phenylephrine Anaphylaxis   Tetracycline Anaphylaxis   Tropicamide Swelling    Benzalkonium Chloride Rash   Lactase    Other reaction(s): Wheezing Other reaction(s): Wheezing   Pneumococcal Vaccine Other (See Comments)   Pt doesn't take this vaccine as a precaution due to other allergens she has. Other reaction(s): Other (See Comments) Pt doesn't take this vaccine as a precaution due to other allergens she has.   Lac Bovis Nausea And Vomiting   Neomycin-bacitracin Zn-polymyx Rash      Medication List       Accurate as of June 24, 2019  8:59 AM. If you have any questions, ask your nurse or doctor.        albuterol 108 (90 Base) MCG/ACT inhaler Commonly known as: Ventolin HFA Inhale 2 puffs into the lungs every 6 (six) hours as needed for wheezing or shortness of breath.   alendronate 70 MG tablet Commonly known as: FOSAMAX Take 1 tablet (70 mg total) by mouth every 7 (seven) days. Take with a full glass of water on an empty stomach.   amLODipine 2.5 MG tablet Commonly known as: NORVASC Take 1 tablet (2.5 mg total) by mouth daily.   EpiPen 2-Pak 0.3 mg/0.3 mL Soaj injection Generic drug: EPINEPHrine Inject 0.3 mg into the muscle as needed for anaphylaxis.   famotidine 20 MG tablet Commonly known as: Pepcid One at bedtime       All past medical history, surgical history, allergies, family history, immunizations andmedications were updated in the EMR today and reviewed under the history and medication portions of their EMR.     ROS: Negative, with the exception of above mentioned in HPI   Objective:  BP 130/84 (BP Location: Right Arm, Patient Position: Sitting, Cuff Size: Normal)   Pulse 70   Temp 98.1 F (36.7 C) (Temporal)   Resp 17   Ht 5' 3"  (1.6 m)   Wt 146 lb 4 oz (66.3 kg)   LMP 07/21/2008 (Approximate)   SpO2 100%   BMI 25.91 kg/m  Body mass index is 25.91 kg/m. Gen: Afebrile. No acute distress. Nontoxic in appearance, well developed, well nourished.  HENT: AT. [place].  Eyes:Pupils Equal Round Reactive to light, Extraocular  movements intact,  Conjunctiva without redness, discharge or icterus. Neck/lymp/endocrine: Supple, no lymphadenopathy CV: RRR no murmur, no edema Chest: CTAB, no wheeze or crackles. Good air movement, normal resp effort.  Abd: Soft.  Flat. NTND. BS present.  No masses palpated. No rebound or guarding.  Neuro:  Normal gait. PERLA. EOMi. Alert. Oriented x3  Psych: Normal affect, dress and demeanor. Normal speech. Normal thought content and judgment.  No exam data present No results found. No results found for this  or any previous visit (from the past 24 hour(s)).  Assessment/Plan: CELESTE TAVENNER is a 68 y.o. female present for OV for  Benign essential HTN -Stable. -Routine exercise and low-sodium diet discussed. -Continue amlodipine 2.5 mg daily, refills provided today. -Follow-up in 6 months.  Osteoporosis without current pathological fracture, unspecified osteoporosis type/Elevated alkaline phosphatase level -Patient is tolerating Fosamax once weekly for her osteoporosis.  Follow yearly with CPE. -Repeat DEXA in 2-3 years. -Discussed elevated alk phos possibly secondary to osteoporosis and vitamin D deficiency will check her vitamin D levels today and repeat CMP.  If alk phos still elevated and not felt to be secondary to her osteoporosis would rule out liver causes with a GGT, given her exposure history low threshold for for evaluation. - Vitamin D (25 hydroxy) - Comp Met (CMET)   Reviewed expectations re: course of current medical issues.  Discussed self-management of symptoms.  Outlined signs and symptoms indicating need for more acute intervention.  Patient verbalized understanding and all questions were answered.  Patient received an After-Visit Summary.   > 25 minutes spent with patient, >50% of time spent face to face     Orders Placed This Encounter  Procedures  . Vitamin D (25 hydroxy)  . Comp Met (CMET)   .  Note is dictated utilizing voice recognition  software. Although note has been proof read prior to signing, occasional typographical errors still can be missed. If any questions arise, please do not hesitate to call for verification.   electronically signed by:  Howard Pouch, DO  [place]

## 2019-06-24 NOTE — Patient Instructions (Signed)
I refilled your amlodipine.   Follow up in 6 months.   I will call you with lab results.

## 2019-06-27 ENCOUNTER — Encounter: Payer: Self-pay | Admitting: Family Medicine

## 2019-06-27 ENCOUNTER — Telehealth: Payer: Self-pay | Admitting: Family Medicine

## 2019-06-27 DIAGNOSIS — R748 Abnormal levels of other serum enzymes: Secondary | ICD-10-CM

## 2019-06-27 HISTORY — DX: Abnormal levels of other serum enzymes: R74.8

## 2019-06-27 NOTE — Telephone Encounter (Signed)
Pt was called and given lab results, she was scheduled for lab appt

## 2019-06-27 NOTE — Telephone Encounter (Signed)
Please inform patient the following information: Alkaline phosphatase is still elevated, and is a little bit higher it was 123, and now 131. Her vitamin D is mildly low at 26.  I have encouraged her to start vitamin D 1000 units daily at her last appointment-I would encourage her to continue this dose.  Since her alk phosphatase is still elevated and her vitamin D is only mildly insufficient, would like to go ahead and perform the test that determines if the elevated alk phos is from liver causes.  She can have this by lab appointment only.  Please schedule within the next week.  We will call her with those results and discuss any further need for evaluation depending upon the results.

## 2019-07-01 ENCOUNTER — Ambulatory Visit (INDEPENDENT_AMBULATORY_CARE_PROVIDER_SITE_OTHER): Payer: Medicare Other | Admitting: Family Medicine

## 2019-07-01 ENCOUNTER — Other Ambulatory Visit: Payer: Self-pay

## 2019-07-01 DIAGNOSIS — R748 Abnormal levels of other serum enzymes: Secondary | ICD-10-CM | POA: Diagnosis not present

## 2019-07-02 LAB — GAMMA GT: GGT: 14 U/L (ref 3–65)

## 2019-07-04 ENCOUNTER — Telehealth: Payer: Self-pay | Admitting: Family Medicine

## 2019-07-04 NOTE — Telephone Encounter (Signed)
Please inform patient the following information: Good news- The extra labs collected to look for liver causes of her elevated alk phos was normal.  Therefore, recommend repeat in 3-4 months.  Provider appointment.  With a normal GGT the elevation is more likely secondary to osteoporosis condition, and we should see improvement after few months of starting Fosamax, vitamin D and calcium.  -If she would happen to experience  any symptoms such as bone pain or right upper quadrant abdominal pain/nausea/vomiting, then follow-up sooner.

## 2019-07-04 NOTE — Telephone Encounter (Signed)
Pt was called and given results, she verbalized understanding She did not want to schedule at this time

## 2019-08-09 ENCOUNTER — Telehealth: Payer: Self-pay | Admitting: Family Medicine

## 2019-08-09 NOTE — Telephone Encounter (Signed)
Pt was called back and she wanted to get COVID vaccine information. She was asked if information could be sent via my chart and she was agreeable. Pt was sent info via my chart.

## 2019-08-09 NOTE — Telephone Encounter (Signed)
Retrieved voicemail from pt  Question regarding COVID vaccine  Please call patient at [phone].

## 2019-09-14 ENCOUNTER — Other Ambulatory Visit: Payer: Self-pay

## 2019-09-14 ENCOUNTER — Encounter: Payer: Self-pay | Admitting: Family Medicine

## 2019-09-14 MED ORDER — EPINEPHRINE 0.3 MG/0.3ML IJ SOAJ: 0.3000 mg | INTRAMUSCULAR | 0 refills | Status: DC | PRN

## 2019-09-14 NOTE — Telephone Encounter (Signed)
Please call patient and clarify her request. 1.  When is the last time she has used an EpiPen and what caused the reaction? 2.   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  But nothing environmental/seasonal that would require an EpiPen.  In order to even attempt to get this covered by her insurance I need to be able to associate a specific reason. Why is she prescribed an EpiPen's- specific reason needed?  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  Thanks.

## 2019-09-14 NOTE — Addendum Note (Signed)
Addended by: Felix Pacini A on: 09/14/2019 02:57 PM   Modules accepted: Orders

## 2019-09-14 NOTE — Telephone Encounter (Signed)
Updated.

## 2019-09-14 NOTE — Telephone Encounter (Signed)
Patient requesting 2 EPINEPHrine (EPIPEN 2-PAK) 0.3 mg/0.3 mL IJ SOAJ injection sent [facility]

## 2019-09-14 NOTE — Telephone Encounter (Signed)
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

## 2019-09-14 NOTE — Telephone Encounter (Signed)
Pt was called and She said she had a red line going up her arm one time after a bee sting in 2016. Pt saw prior PCP for this one time and has not had it refilled since 2016. Pt asked for a call back if this would be refilled for her.

## 2019-09-14 NOTE — Telephone Encounter (Signed)
Mychart message sent to patient regarding epipen refill and instructions.

## 2019-09-14 NOTE — Telephone Encounter (Signed)
RF request for Epi Pen  LOV: 06/24/2019 Next ov: Not scheduled  Last written: Never been written by anyone at this office   Please advise

## 2019-09-14 NOTE — Telephone Encounter (Signed)
I called the epipen in for her to her pharmacy.  I cannot guarantee insurance will cover an EpiPen if she has not had proven anaphylaxis-like reaction from bees, milk or peaches. Please make her aware she should only use the EpiPen if she is having an anaphylaxis reaction.  Swelling of the face/mouth, shortness of breath etc. after exposure to her reported above allergies.

## 2019-09-16 ENCOUNTER — Ambulatory Visit: Payer: Medicare PPO | Attending: Internal Medicine

## 2019-09-16 DIAGNOSIS — Z23 Encounter for immunization: Secondary | ICD-10-CM | POA: Insufficient documentation

## 2019-09-16 NOTE — Progress Notes (Signed)
   Covid-19 Vaccination Clinic  Name:  Alexandria Rush    MRN: [id number] DOB: 04/28/1951  09/16/2019  Alexandria Rush was observed post Covid-19 immunization for 30 minutes based on pre-vaccination screening without incidence. She was provided with Vaccine Information Sheet and instruction to access the V-Safe system.   Alexandria Rush was instructed to call 911 with any severe reactions post vaccine: Marland Kitchen Difficulty breathing  . Swelling of your face and throat  . A fast heartbeat  . A bad rash all over your body  . Dizziness and weakness    Immunizations Administered    Name Date Dose VIS Date Route   Pfizer COVID-19 Vaccine 09/16/2019  4:00 AM 0.3 mL 07/01/2019 Intramuscular   Manufacturer: [employer], [employer]   Lot: [id number]   NDC: [address]-8

## 2019-10-12 ENCOUNTER — Ambulatory Visit: Payer: Medicare PPO | Attending: Internal Medicine

## 2019-10-12 DIAGNOSIS — Z23 Encounter for immunization: Secondary | ICD-10-CM

## 2019-10-12 NOTE — Progress Notes (Signed)
   Covid-19 Vaccination Clinic  Name:  Alexandria Rush    MRN: [id number] DOB: 1951/07/06  10/12/2019  [name] was observed post Covid-19 immunization for 30 minutes based on pre-vaccination screening without incident. She was provided with Vaccine Information Sheet and instruction to access the V-Safe system.   [name] was instructed to call 911 with any severe reactions post vaccine: Marland Kitchen Difficulty breathing  . Swelling of face and throat  . A fast heartbeat  . A bad rash all over body  . Dizziness and weakness   Immunizations Administered    Name Date Dose VIS Date Route   Pfizer COVID-19 Vaccine 10/12/2019  3:37 PM 0.3 mL 07/01/2019 Intramuscular   Manufacturer: [employer], [employer]   Lot: [id number]   NDC: [address]-5

## 2020-01-03 ENCOUNTER — Other Ambulatory Visit: Payer: Self-pay

## 2020-01-03 ENCOUNTER — Ambulatory Visit: Payer: Medicare PPO | Admitting: Family Medicine

## 2020-01-03 ENCOUNTER — Encounter: Payer: Self-pay | Admitting: Family Medicine

## 2020-01-03 VITALS — BP 139/91 | HR 69 | Temp 97.3°F | Resp 17 | Ht 63.0 in | Wt 145.1 lb

## 2020-01-03 DIAGNOSIS — E559 Vitamin D deficiency, unspecified: Secondary | ICD-10-CM

## 2020-01-03 DIAGNOSIS — T7840XD Allergy, unspecified, subsequent encounter: Secondary | ICD-10-CM | POA: Diagnosis not present

## 2020-01-03 DIAGNOSIS — R748 Abnormal levels of other serum enzymes: Secondary | ICD-10-CM

## 2020-01-03 DIAGNOSIS — J4520 Mild intermittent asthma, uncomplicated: Secondary | ICD-10-CM | POA: Diagnosis not present

## 2020-01-03 DIAGNOSIS — M810 Age-related osteoporosis without current pathological fracture: Secondary | ICD-10-CM

## 2020-01-03 DIAGNOSIS — I10 Essential (primary) hypertension: Secondary | ICD-10-CM | POA: Diagnosis not present

## 2020-01-03 LAB — COMPREHENSIVE METABOLIC PANEL
ALT: 10 U/L (ref 0–35)
AST: 12 U/L (ref 0–37)
Albumin: 4.3 g/dL (ref 3.5–5.2)
Alkaline Phosphatase: 102 U/L (ref 39–117)
BUN: 16 mg/dL (ref 6–23)
CO2: 29 mEq/L (ref 19–32)
Calcium: 9.3 mg/dL (ref 8.4–10.5)
Chloride: 106 mEq/L (ref 96–112)
Creatinine, Ser: 0.7 mg/dL (ref 0.40–1.20)
GFR: 83.27 mL/min (ref >60.00)
Glucose, Bld: 90 mg/dL (ref 70–99)
Potassium: 4.4 mEq/L (ref 3.5–5.1)
Sodium: 140 mEq/L (ref 135–145)
Total Bilirubin: 0.4 mg/dL (ref 0.2–1.2)
Total Protein: 6.7 g/dL (ref 6.0–8.3)

## 2020-01-03 LAB — VITAMIN D 25 HYDROXY (VIT D DEFICIENCY, FRACTURES): VITD: 24.01 ng/mL — ABNORMAL LOW (ref 30.00–100.00)

## 2020-01-03 MED ORDER — AMLODIPINE BESYLATE 5 MG PO TABS: 5.0000 mg | ORAL_TABLET | Freq: Every day | ORAL | 1 refills | Status: DC

## 2020-01-03 NOTE — Patient Instructions (Addendum)
  We increased your amlodipine to 5 mg a day.  Please always take BP medications at least 2 hours before appts so we can document the dose is appropriate by a normal BP reading in the system.    Calcium 1200 mg  and vitd 1000 units daily recommended for bone health.   November 8 th for your physical.

## 2020-01-03 NOTE — Progress Notes (Signed)
This visit occurred during the SARS-CoV-2 public health emergency.  Safety protocols were in place, including screening questions prior to the visit, additional usage of staff PPE, and extensive cleaning of exam room while observing appropriate contact time as indicated for disinfecting solutions.    Alexandria Rush , 28-Jul-1950, 69 y.o., female MRN: [id number] Patient Care Team    Relationship Specialty Notifications Start End  Ma Hillock, DO PCP - General Family Medicine  08/20/18   Tanda Rockers, MD Consulting Physician Pulmonary Disease  08/20/18   Clair Gulling, MD    08/20/18    Comment: thoracic surgeon  Clarene Essex, MD Consulting Physician Gastroenterology  08/20/18     Chief Complaint  Patient presents with  . Hypertension    Pt does have BP log. She takes BP medications at 12pm daily.   . Gastroesophageal Reflux     Subjective: Pt presents for an OV for new onset HTN follow Hypertension:  Pt reports compliance with amlodipine 2.5 mg daily. Blood pressures ranges at home within normal limits. Patient denies chest pain, shortness of breath or lower extremity edema. Pt does not take a daily baby ASA. Pt is is not prescribed statin. Diet: Low-sodium Exercise: Routine exercise.   Osteoporosis: DEXA with newfound osteoporosis.  Patient also had elevated alkaline phosphatase on labs collected 4 weeks ago.  She has started the vitamin D and calcium, as well as Fosamax for her osteoporosis 4 weeks ago.  She is tolerating the Fosamax.  She has no nausea, vomiting, abdominal pain.  Allergies:  Patient has multiple allergies to medications, vaccines, lactose, bee venom and fruits.  She would like a referral to an allergist.  Depression screen [facility] 2/9 05/26/2019 05/25/2019 08/20/2018  Decreased Interest 0 0 0  Down, Depressed, Hopeless 0 0 0  PHQ - 2 Score 0 0 0   Allergies  Allergen Reactions  . Erythromycin Anaphylaxis  . Influenza Vaccines Anaphylaxis  . Penicillins  Anaphylaxis  . Phenylephrine Anaphylaxis  . Tetracycline Anaphylaxis  . Tropicamide Swelling  . Benzalkonium Chloride Rash  . Fosamax [Alendronate] Nausea Only    Worsened GERD and stomach upset.   . Lactase     Other reaction(s): Wheezing Other reaction(s): Wheezing   . Lactose Intolerance (Gi) Swelling    wheezing  . Peach [Prunus Persica] Swelling  . Pneumococcal Vaccine Other (See Comments)    Pt doesn't take this vaccine as a precaution due to other allergens she has. Other reaction(s): Other (See Comments) Pt doesn't take this vaccine as a precaution due to other allergens she has.   . Bee Venom Rash  . Lac Bovis Nausea And Vomiting  . Neomycin-Bacitracin Zn-Polymyx Rash   Social History   Social History Narrative   Marital status/children/pets: married, 2 children. From [facility]   Education/employment: masters degree. Retired- Sr. 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 Anemia   . Asthma   . Chicken pox   . GERD (gastroesophageal reflux disease)   . Hypertension   . MVP (mitral valve prolapse)   . Rib fracture 08/27/2017   left 6-9 rib fx- seen of f/u CT- xray was normal   Past Surgical History:  Procedure Laterality Date  . APPENDECTOMY    . COLONOSCOPY  2019  .  HYSTEROSCOPY    . NASAL SINUS SURGERY  2014  . RIB FRACTURE SURGERY  2019  . TONSILLECTOMY     Family History  Problem Relation Age of Onset  . Stomach cancer Mother   . Cancer Mother   . Hypertension Father   . Heart disease Father   . Stroke Father   . Diabetes Brother   . Allergies Brother   . Asthma Brother   . Diabetes Paternal Aunt   . Hypertension Brother   . Allergies Sister   . Asthma Sister   . Diabetes Sister   . Allergies Son   . Allergies Daughter   . Cancer Maternal Aunt   . Stroke Maternal Uncle   . Cancer Paternal Aunt        breast  .  Breast cancer Paternal Aunt   . Cancer Maternal Uncle        pancreatic   Allergies as of 01/03/2020      Reactions   Erythromycin Anaphylaxis   Influenza Vaccines Anaphylaxis   Penicillins Anaphylaxis   Phenylephrine Anaphylaxis   Tetracycline Anaphylaxis   Tropicamide Swelling   Benzalkonium Chloride Rash   Fosamax [alendronate] Nausea Only   Worsened GERD and stomach upset.    Lactase    Other reaction(s): Wheezing Other reaction(s): Wheezing   Lactose Intolerance (gi) Swelling   wheezing   Peach [prunus Persica] Swelling   Pneumococcal Vaccine Other (See Comments)   Pt doesn't take this vaccine as a precaution due to other allergens she has. Other reaction(s): Other (See Comments) Pt doesn't take this vaccine as a precaution due to other allergens she has.   Bee Venom Rash   Lac Bovis Nausea And Vomiting   Neomycin-bacitracin Zn-polymyx Rash      Medication List       Accurate as of January 03, 2020  5:15 PM. If you have any questions, ask your nurse or doctor.        STOP taking these medications   alendronate 70 MG tablet Commonly known as: FOSAMAX Stopped by: Howard Pouch, DO     TAKE these medications   albuterol 108 (90 Base) MCG/ACT inhaler Commonly known as: Ventolin HFA Inhale 2 puffs into the lungs every 6 (six) hours as needed for wheezing or shortness of breath.   amLODipine 5 MG tablet Commonly known as: NORVASC Take 1 tablet (5 mg total) by mouth daily. What changed:   medication strength  how much to take Changed by: Howard Pouch, DO   EPINEPHrine 0.3 mg/0.3 mL Soaj injection Commonly known as: EpiPen 2-Pak Inject 0.3 mLs (0.3 mg total) into the muscle as needed for anaphylaxis.   famotidine 20 MG tablet Commonly known as: Pepcid One at bedtime       All past medical history, surgical history, allergies, family history, immunizations andmedications were updated in the EMR today and reviewed under the history and medication portions of  their EMR.     ROS: Negative, with the exception of above mentioned in HPI   Objective:  BP (!) 139/91 (BP Location: Left Arm, Patient Position: Sitting, Cuff Size: Normal)   Pulse 69   Temp (!) 97.3 F (36.3 C) (Temporal)   Resp 17   Ht 5' 3"  (1.6 m)   Wt 145 lb 2 oz (65.8 kg)   LMP 07/21/2008 (Approximate)   BMI 25.71 kg/m  Body mass index is 25.71 kg/m. Gen: Afebrile. No acute distress. Nontoxic. Pleasant female.  HENT: AT. [place]. No cough or hoarseness.  Eyes:Pupils Equal Round Reactive to light, Extraocular movements intact,  Conjunctiva without redness, discharge or icterus. CV: RRR no murmur, no edema Chest: CTAB, no wheeze or crackles Abd: Soft. NTND. BS present. no Masses palpated.  Skin: no rashes, purpura or petechiae.  Neuro:  Normal gait. PERLA. EOMi. Alert. Oriented x3 Psych: Normal affect, dress and demeanor. Normal speech. Normal thought content and judgment    No exam data present No results found. Results for orders placed or performed in visit on 01/03/20 (from the past 24 hour(s))  Comp Met (CMET)     Status: None   Collection Time: 01/03/20  9:34 AM  Result Value Ref Range   Sodium 140 135 - 145 mEq/L   Potassium 4.4 3.5 - 5.1 mEq/L   Chloride 106 96 - 112 mEq/L   CO2 29 19 - 32 mEq/L   Glucose, Bld 90 70 - 99 mg/dL   BUN 16 6 - 23 mg/dL   Creatinine, Ser 0.70 0.40 - 1.20 mg/dL   Total Bilirubin 0.4 0.2 - 1.2 mg/dL   Alkaline Phosphatase 102 39 - 117 U/L   AST 12 0 - 37 U/L   ALT 10 0 - 35 U/L   Total Protein 6.7 6.0 - 8.3 g/dL   Albumin 4.3 3.5 - 5.2 g/dL   GFR 83.27 >60.00 mL/min   Calcium 9.3 8.4 - 10.5 mg/dL  Vitamin D (25 hydroxy)     Status: Abnormal   Collection Time: 01/03/20  9:34 AM  Result Value Ref Range   VITD 24.01 (L) 30.00 - 100.00 ng/mL    Assessment/Plan: Alexandria Rush is a 69 y.o. female present for OV for  Benign essential HTN - borderline pressure here and home BP routinely elevated > 841 systolic.  - Increase  amlodipine to 5 mg QD.  -Routine exercise and low-sodium diet discussed. -Follow-up Nov for CPE/CMC  Osteoporosis without current pathological fracture, unspecified osteoporosis type/Elevated alkaline phosphatase level -Patient did not  tolerate Fosamax . -Repeat DEXA in 05/2021 -Discussed elevated alk phos possibly secondary to osteoporosis and vitamin D insufficiency.  - Vit d 1000u and ca 1200 mg by diet or supplement recommended. She does not know how much she is getting her 8 ounces of OJ (fortiefied with ca/d).  - GGT - normal.  - Vitamin D (25 hydroxy) collected today - Comp Met (CMET) collected today.  - continue exercise programs.    Allergies:  Referral to allergy placed.   Reviewed expectations re: course of current medical issues.  Discussed self-management of symptoms.  Outlined signs and symptoms indicating need for more acute intervention.  Patient verbalized understanding and all questions were answered.  Patient received an After-Visit Summary.      Orders Placed This Encounter  Procedures  . Comp Met (CMET)  . Vitamin D (25 hydroxy)  . Ambulatory referral to Allergy   Meds ordered this encounter  Medications  . amLODipine (NORVASC) 5 MG tablet    Sig: Take 1 tablet (5 mg total) by mouth daily.    Dispense:  90 tablet    Refill:  1    Referral Orders     Ambulatory referral to Allergy   Note is dictated utilizing voice recognition software. Although note has been proof read prior to signing, occasional typographical errors still can be missed. If any questions arise, please do not hesitate to call for verification.   electronically signed by:  Howard Pouch, DO  [place]

## 2020-01-05 ENCOUNTER — Encounter: Payer: Self-pay | Admitting: Family Medicine

## 2020-01-26 ENCOUNTER — Ambulatory Visit: Payer: Medicare Other | Admitting: Allergy

## 2020-02-03 DIAGNOSIS — Z7983 Long term (current) use of bisphosphonates: Secondary | ICD-10-CM | POA: Diagnosis not present

## 2020-02-03 DIAGNOSIS — Z809 Family history of malignant neoplasm, unspecified: Secondary | ICD-10-CM | POA: Diagnosis not present

## 2020-02-03 DIAGNOSIS — Z833 Family history of diabetes mellitus: Secondary | ICD-10-CM | POA: Diagnosis not present

## 2020-02-03 DIAGNOSIS — G8929 Other chronic pain: Secondary | ICD-10-CM | POA: Diagnosis not present

## 2020-02-03 DIAGNOSIS — Z8249 Family history of ischemic heart disease and other diseases of the circulatory system: Secondary | ICD-10-CM | POA: Diagnosis not present

## 2020-02-03 DIAGNOSIS — Z823 Family history of stroke: Secondary | ICD-10-CM | POA: Diagnosis not present

## 2020-02-03 DIAGNOSIS — J45909 Unspecified asthma, uncomplicated: Secondary | ICD-10-CM | POA: Diagnosis not present

## 2020-02-03 DIAGNOSIS — I10 Essential (primary) hypertension: Secondary | ICD-10-CM | POA: Diagnosis not present

## 2020-02-03 DIAGNOSIS — M810 Age-related osteoporosis without current pathological fracture: Secondary | ICD-10-CM | POA: Diagnosis not present

## 2020-02-03 DIAGNOSIS — Z887 Allergy status to serum and vaccine status: Secondary | ICD-10-CM | POA: Diagnosis not present

## 2020-02-14 ENCOUNTER — Other Ambulatory Visit: Payer: Self-pay

## 2020-02-14 ENCOUNTER — Encounter (HOSPITAL_BASED_OUTPATIENT_CLINIC_OR_DEPARTMENT_OTHER): Payer: Self-pay | Admitting: THORACIC SURGERY CARDIOTHORACIC VASCULAR SURGERY

## 2020-02-14 ENCOUNTER — Ambulatory Visit
Payer: Medicare (Managed Care) | Attending: PHYSICIAN ASSISTANT | Admitting: THORACIC SURGERY CARDIOTHORACIC VASCULAR SURGERY

## 2020-02-14 VITALS — BP 157/90 | HR 86 | Temp 97.2°F | Resp 20 | Ht 63.0 in | Wt 147.0 lb

## 2020-02-14 DIAGNOSIS — M954 Acquired deformity of chest and rib: Secondary | ICD-10-CM | POA: Diagnosis not present

## 2020-02-14 DIAGNOSIS — S2242XD Multiple fractures of ribs, left side, subsequent encounter for fracture with routine healing: Secondary | ICD-10-CM | POA: Diagnosis not present

## 2020-02-14 DIAGNOSIS — Z9889 Other specified postprocedural states: Secondary | ICD-10-CM | POA: Diagnosis not present

## 2020-02-14 DIAGNOSIS — M940 Chondrocostal junction syndrome [Tietze]: Secondary | ICD-10-CM | POA: Diagnosis not present

## 2020-02-14 DIAGNOSIS — X58XXXD Exposure to other specified factors, subsequent encounter: Secondary | ICD-10-CM | POA: Insufficient documentation

## 2020-02-14 MED ORDER — METHYLPREDNISOLONE 4 MG TABLETS IN A DOSE PACK
ORAL_TABLET | ORAL | 0 refills | Status: DC
Start: 2020-02-14 — End: 2021-02-21

## 2020-02-14 NOTE — Progress Notes (Signed)
[facility] Thoracic Surgery  Progress Note    Name: Anita Vasquez  MRN: [id number]  Date of visit: 02/14/2020     Subjective:  Patient is a 69 y.o. female who was seen today for left rib pain.  It is located in her left costal margin and radiates in a stripe posteriorly to her back.  It has been present for one year and occurs multiple times daily.  It began when she had a broken leg and was on crutches.  She fell onto the crutch striking her ribs hard.  She was found at the time to have left 6-10th rib fractures.  Ibuprofen and rest minimally improves it.  It lasts for hours and occurs when she does any activities, such as pulling weeds.  It is described by the patient as severe pain.  Her husband states that she is often unable to do anything because of the pain.  She denies depression or suicidal ideations due to the pain.  Workup by her PCP, an Orthopedic surgeon, and a Thoracic surgeon in [place] (from which she has traveled today) has revealed nothing except healed 6-10th left rib fractures.  She has had 2 CTs and rib films.  I reviewed the images from her chest and abdomen CT scans and note a flared anterior left 9th rib and a separation of the tip of the left 10th rib cartilage.    She underwent ORIF of both the slipped 9th and 10th left ribs on 04/21/18. Since surgery she has felt remarkably improved and is highly satisfied, however she presents today with left rib pain that occurred while exercising. She says that she did a standing left knee raise and had immediate pain on her left side in the area of the prior repair and radiating into her back.    There are no problems to display for this patient.      Objective:  BP (!) 157/90    Pulse 86    Temp 36.2 C (97.2 F)    Resp 20    Ht 1.6 m (5\' 3" )    Wt 66.7 kg (147 lb)    SpO2 99%    BMI 26.04 kg/m       Physical Exam:  General: Well nourished.  Lungs:  Clear to auscultation bilaterally   Chest:  Stable and fused prior left 9th and 10th repair.    Heart: Regular rate and rhythm, S1, S2 normal, no murmur, click, rub or gallop  Abdomen: Soft, non-tender. Bowel sounds normal. No masses,  No organomegaly.  Extremities: Extremities normal, atraumatic, no cyanosis or edema.    Assessment:  1. 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 Closed fracture of multiple ribs of left side with routine healing, subsequent encounter    3. Slipped rib syndrome       Her prior repair is stable and has healed in appropriately. It is possible that the prior repair is over tight, but it is more likely that she aggravated the area of repair while exercising. Will prescribe her a Medrol dosepack for now. If this does not help to reduce her pain, would then consider revision.    Plan:  Orders Placed This Encounter    Methylprednisolone (MEDROL DOSEPACK) 4 mg Oral Tablets, Dose Pack          , PA-C    I personally saw and examined the patient and agree with the above findings and plan.  Likely a strain in the  scar tissue causing mild intercostal nerve compression.  Prescribed Medrol.  If no relief, patient to see me back.

## 2020-02-27 NOTE — Progress Notes (Signed)
New Patient Note  RE: Alexandria Rush MRN: [id number] DOB: 07-04-1951 Date of Office Visit: 02/28/2020  Referring provider: Natalia Leatherwood, DO Primary care provider: Natalia Leatherwood, DO  Chief Complaint: Asthma and Allergic Rhinitis   History of Present Illness: I had the pleasure of seeing Alexandria Rush for initial evaluation at the [facility] on 02/28/2020. She is a 69 y.o. female, who is referred here by Felix Pacini A, DO for the evaluation of multiple allergies and asthma.  COVID-19 vaccine: Patient received her first Pfizer vaccine on 09/16/2019. Patient developed some sinus drainage about 1 hour after receiving the injection for about 1 day. She took benadryl for this with good benefit. Denies any other symptoms.  She received her second dose on 10/12/2019 and within 30 minutes she developed some chest tightness and rhinorrhea. She also developed some voice hoarseness. She took benadryl for 3 days and was in bed for 3 days due to feeling "lousy". On the fifth day patient was in the bathroom the whole day due to frequent bowel movements. The bowel movements were solid and not watery.  Patient had no prior COVID-19 diagnosis.  She is concerned about future booster vaccines.  Any known reactions to polyethylene glycol or polysorbate?  Not sure.   Any history of anaphylaxis to vaccinations? Patient had reaction to the flu vaccine in 2008.  She got her vaccine around 11:30AM and within 15 minutes she had trouble breathing.  She was having hoarseness, trouble breathing and trouble talking. She was given 1 dose of epi but then 15 minutes later the symptoms returned she was given another dose of epi and within 15-20 minutes her symptoms improved. She was also given benadryl.  Patient started to wheeze that night for which she took her inhaler.  She did receive her childhood vaccines before with no issues. Since the above episode, she has not had any other vaccines  since then except for the Pfizer COVID-19 vaccines.  Any history of reactions to injectable medications? N/a.  Any history of anaphylaxis to colonoscopy preps (i.e.Miralax)? No issues with colonoscopy prep.   Multiple drug allergies: Patient had anaphylaxis after taking penicillin pills in 2013 for a few days. She had trouble breathing and throat was closing. She was treated with epinephrine x 2 at that time and benadryl.  Phenylephrine and tetracycline causes throat closure but did not require epinephrine for these episodes. She took benadryl.   Tropicamide caused wheezing, throat tightness.  Asthma: She reports symptoms of chest tightness, coughing, wheezing for 20 years. Current medications include albuterol prn which help. She reports not using aerochamber with inhalers. She tried the following inhalers: none. Main triggers are allergies. In the last month, frequency of symptoms: 0x/week. Frequency of nocturnal symptoms: 0x/month. Frequency of SABA use: <1x/week. Interference with physical activity: no. Sleep is undisturbed. In the last 12 months, emergency room visits/urgent care visits/doctor office visits or hospitalizations due to respiratory issues: 0. In the last 12 months, oral steroids courses: 0. Lifetime history of hospitalization for respiratory issues: 0. Prior intubations: 0. History of pneumonia: no. She was not evaluated by allergist/pulmonologist in the past. Smoking exposure: no. Up to date with flu vaccine: no. Up to date with pneumonia vaccine: no. Up to date with COVID-19 vaccine: yes.  History of reflux: sometimes and takes antacids as needed.  Assessment and Plan: Alexandria Rush is a 69 y.o. female with: Multiple drug allergies Patient is concerned about developing allergies to the [employer]  COVID-19 vaccine.  She developed her first injection and had some sinus drainage for 1 day.  Her second injection was followed by some chest tightness, rhinorrhea and voice hoarseness within  30 minutes and was in bed for 3 days for feeling lousy.  No prior COVID-19 diagnosis.  She had anaphylactic reaction to the flu vaccine in 2008 requiring 2 doses of epinephrine.  She also had anaphylactic reaction to penicillin pills requiring 2 doses of epinephrine.  Discussed with patient that she is fully vaccinated as of now for COVID-19. No indication for component testing for now.  If there are future boosters required, then recommend calling us first before receiving any future COVID-19 vaccines to see if she needs to undergo any testing at that time.   Continue to avoid drugs that are bothersome - penicillin, flu vaccines, tetracyclines, erythromycin, phenylephrine, tropicamide.   For mild symptoms you can take over the counter antihistamines such as Benadryl and monitor symptoms closely. If symptoms worsen or if you have severe symptoms including breathing issues, throat closure, significant swelling, whole body hives, severe diarrhea and vomiting, lightheadedness then inject epinephrine and seek immediate medical care afterwards.  Emergency action plan given.    Will check tryptase level at next visit.  Mild intermittent asthma without complication Diagnosed with asthma over 20 years ago.  Main triggers are allergies and uses albuterol on a rare occasion with good benefit.  Today's spirometry showed some possible restriction.  May use albuterol rescue inhaler 2 puffs every 4 to 6 hours as needed for shortness of breath, chest tightness, coughing, and wheezing. Monitor frequency of use.    Chronic rhinitis Rhinoconjunctivitis symptoms in the spring and early summer.  Takes Benadryl and Zyrtec with good benefit.    Today's skin prick testing was negative to indoor and outdoor allergens.  May use over the counter antihistamines such as Zyrtec (cetirizine), Claritin (loratadine), Allegra (fexofenadine), or Xyzal (levocetirizine) daily as needed.  Adverse food reaction Currently  avoiding dairy and peaches. Dairy causes wheezing and abdominal bloating.  Today's skin testing was negative to dairy, casein and peaches.  Continue to avoid the foods that bother your - milk and peaches.  Offered bloodwork for milk and peach IgE but patient declines.  For mild symptoms you can take over the counter antihistamines such as Benadryl and monitor symptoms closely. If symptoms worsen or if you have severe symptoms including breathing issues, throat closure, significant swelling, whole body hives, severe diarrhea and vomiting, lightheadedness then seek immediate medical care.  Return if symptoms worsen or fail to improve.  Other allergy screening: Rhino conjunctivitis: yes  Sneezing and itchy eyes during the spring and beginning of the summer. Takes benadryl and zyrtec with good benefit.  Sinus surgery a few years ago. Food allergy: yes  Dairy causes wheezing, abdominal bloating. Hymenoptera allergy: large localized reactions Urticaria: no Eczema: yes, well controlled.  History of recurrent infections suggestive of immunodeficency: no  Diagnostics: Spirometry:  Tracings reviewed. Her effort: Good reproducible efforts. FVC: 2.16L FEV1: 1.95L, 86% predicted FEV1/FVC ratio: 90% Interpretation: Spirometry consistent with possible restrictive disease.  Please see scanned spirometry results for details.  Skin Testing: Environmental allergy panel and select foods. Today's skin testing was negative to indoor/outdoor allergens, milk, casein and peach.   Results discussed with patient/family.  Airborne Adult Perc - 02/28/20 0938    Time Antigen Placed 1779    Allergen Manufacturer Waynette Buttery    Location Back    Number of Test 59  Panel 1 Select    1. Control-Buffer 50% Glycerol Negative    2. Control-Histamine 1 mg/ml 2+    3. Albumin saline Negative    4. Bahia Negative    5. French Southern Territories Negative    6. Johnson Negative    7. Kentucky Blue Negative    8. Meadow Fescue  Negative    9. Perennial Rye Negative    10. Sweet Vernal Negative    11. Timothy Negative    12. Cocklebur Negative    13. Burweed Marshelder Negative    14. Ragweed, short Negative    15. Ragweed, Giant Negative    16. Plantain,  English Negative    17. Lamb's Quarters Negative    18. Sheep Sorrell Negative    19. Rough Pigweed Negative    20. Marsh Elder, Rough Negative    21. Mugwort, Common Negative    22. Ash mix Negative    23. Birch mix Negative    24. Beech American Negative    25. Box, Elder Negative    26. Cedar, red Negative    27. Cottonwood, Guinea-Bissau Negative    28. Elm mix Negative    29. Hickory Negative    30. Maple mix Negative    31. Oak, Guinea-Bissau mix Negative    32. Pecan Pollen Negative    33. Pine mix Negative    34. Sycamore Eastern Negative    35. Walnut, Black Pollen Negative    36. Alternaria alternata Negative    37. Cladosporium Herbarum Negative    38. Aspergillus mix Negative    39. Penicillium mix Negative    40. Bipolaris sorokiniana (Helminthosporium) Negative    41. Drechslera spicifera (Curvularia) Negative    42. Mucor plumbeus Negative    43. Fusarium moniliforme Negative    44. Aureobasidium pullulans (pullulara) Negative    45. Rhizopus oryzae Negative    46. Botrytis cinera Negative    47. Epicoccum nigrum Negative    48. Phoma betae Negative    49. Candida Albicans Negative    50. Trichophyton mentagrophytes Negative    51. Mite, D Farinae  5,000 AU/ml Negative    52. Mite, D Pteronyssinus  5,000 AU/ml Negative    53. Cat Hair 10,000 BAU/ml Negative    54.  Dog Epithelia Negative    55. Mixed Feathers Negative    56. Horse Epithelia Negative    57. Cockroach, German Negative    58. Mouse Negative    59. Tobacco Leaf Negative          Food Adult Perc - 02/28/20 0900    Time Antigen Placed 1610    Allergen Manufacturer Waynette Buttery    Location Back    Number of allergen test 3    5. Milk, cow Negative    7. Casein Negative     59. Peach Negative           Past Medical History: Patient Active Problem List   Diagnosis Date Noted  . Multiple drug allergies 02/28/2020  . Mild intermittent asthma without complication 02/28/2020  . Chronic rhinitis 02/28/2020  . Adverse food reaction 02/28/2020  . Elevated alkaline phosphatase level 06/27/2019  . Osteoporosis 06/06/2019  . Overweight (BMI 25.0-29.9) 05/26/2019  . Other microscopic hematuria 09/22/2018  . Contact with and (suspected) exposure to other hazardous metals 09/22/2018  . Fibromyalgia 09/09/2018  . GERD (gastroesophageal reflux disease) 08/20/2018  . Asthma, extrinsic, without status asthmaticus 08/20/2018  . Benign essential HTN 08/20/2018  .  Fothergill's neuralgia 08/20/2018  . Peptic esophagitis 08/20/2018  . Allergic rhinitis due to allergen 08/20/2018  . Upper airway cough syndrome 12/02/2013  . Nasal septal deviation 12/09/2012   Past Medical History:  Diagnosis Date  . Anemia   . Asthma   . Chicken pox   . GERD (gastroesophageal reflux disease)   . Hypertension   . MVP (mitral valve prolapse)   . Rib fracture 08/27/2017   left 6-9 rib fx- seen of f/u CT- xray was normal   Past Surgical History: Past Surgical History:  Procedure Laterality Date  . ADENOIDECTOMY    . APPENDECTOMY    . COLONOSCOPY  2019  . HYSTEROSCOPY    . NASAL SINUS SURGERY  2014  . RIB FRACTURE SURGERY  2019  . TONSILLECTOMY     Medication List:  Current Outpatient Medications  Medication Sig Dispense Refill  . albuterol (VENTOLIN HFA) 108 (90 Base) MCG/ACT inhaler Inhale 2 puffs into the lungs every 6 (six) hours as needed for wheezing or shortness of breath. 8 g 11  . Alendronate Sodium 70 MG TBEF     . amLODipine (NORVASC) 5 MG tablet Take 1 tablet (5 mg total) by mouth daily. 90 tablet 1  . Calcium Carb-Cholecalciferol (CALCIUM CARBONATE-VITAMIN D3 PO) Take by mouth.    . EPINEPHrine (EPIPEN 2-PAK) 0.3 mg/0.3 mL IJ SOAJ injection Inject 0.3 mLs (0.3  mg total) into the muscle as needed for anaphylaxis. 1 each 0  . Omega-3 Fatty Acids (KP FISH OIL) 1200 MG CAPS Take by mouth.    . famotidine (PEPCID) 20 MG tablet One at bedtime 30 tablet 2   No current facility-administered medications for this visit.   Allergies: Allergies  Allergen Reactions  . Erythromycin Anaphylaxis  . Influenza Vaccines Anaphylaxis  . Penicillins Anaphylaxis  . Phenylephrine Anaphylaxis  . Tetracycline Anaphylaxis  . Tropicamide Swelling  . Benzalkonium Chloride Rash  . Fosamax [Alendronate] Nausea Only    Worsened GERD and stomach upset.   . Lactase     Other reaction(s): Wheezing Other reaction(s): Wheezing   . Lactose Intolerance (Gi) Swelling    wheezing  . Peach [Prunus Persica] Swelling  . Pneumococcal Vaccine Other (See Comments)    Pt doesn't take this vaccine as a precaution due to other allergens she has. Other reaction(s): Other (See Comments) Pt doesn't take this vaccine as a precaution due to other allergens she has.   . Bee Venom Rash  . Lac Bovis Nausea And Vomiting  . Neomycin-Bacitracin Zn-Polymyx Rash   Social History: Social History   Socioeconomic History  . Marital status: Married    Spouse name: Not on file  . Number of children: Not on file  . Years of education: Not on file  . Highest education level: Not on file  Occupational History  . Occupation: Teaching laboratory technicianroject Manager    Employer: [employer]  Tobacco Use  . Smoking status: Never Smoker  . Smokeless tobacco: Never Used  Vaping Use  . Vaping Use: Never used  Substance and Sexual Activity  . Alcohol use: Yes    Alcohol/week: 0.0 standard drinks    Comment: socially/occ  . Drug use: No  . Sexual activity: Yes    Birth control/protection: Post-menopausal    Comment: 1st intercourse 69 yo-Fewer than 5 partners  Other Topics Concern  . Not on file  Social History Narrative   Marital status/children/pets: [place], [place]   Education/employment:  masters degree. 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 Difficulty of Paying Living Expenses:   Food Insecurity:   . Worried About Programme researcher, broadcasting/film/video in the Last Year:   . Barista in the Last Year:   Transportation Needs:   . Freight forwarder (Medical):   Marland Kitchen Lack of Transportation (Non-Medical):   Physical Activity:   . Days of Exercise per Week:   . Minutes of Exercise per Session:   Stress:   . Feeling of Stress :   Social Connections:   . Frequency of Communication with Friends and Family:   . Frequency of Social Gatherings with Friends and Family:   . Attends Religious Services:   . Active Member of Clubs or Organizations:   . Attends Banker Meetings:   Marland Kitchen Marital Status:    Lives in a 68 year old home. Smoking: denies Occupation: retired  Landscape architect HistorySurveyor, minerals in the house: no Engineer, civil (consulting) in the family room: yes Carpet in the bedroom: yes Heating: gas Cooling: central Pet: no  Family History: Family History  Problem Relation Age of Onset  . Stomach cancer Mother   . Cancer Mother   . Hypertension Father   . Heart disease Father   . Stroke Father   . Diabetes Brother   . Allergies Brother   . Asthma Brother   . Allergic rhinitis Brother   . Diabetes Paternal Aunt   . Hypertension Brother   . Allergies Sister   . Asthma Sister   . Diabetes Sister   . Allergic rhinitis Sister   . Allergies Son   . Allergies Daughter   . Cancer Maternal Aunt   . Stroke Maternal Uncle   . Cancer Paternal Aunt        breast  . Breast cancer Paternal Aunt   . Cancer Maternal Uncle        pancreatic   Review of Systems  Constitutional: Negative for appetite change, chills, fever and unexpected weight change.  HENT: Negative for congestion and rhinorrhea.     Eyes: Negative for itching.  Respiratory: Negative for cough, chest tightness, shortness of breath and wheezing.   Cardiovascular: Negative for chest pain.  Gastrointestinal: Negative for abdominal pain.  Genitourinary: Negative for difficulty urinating.  Skin: Negative for rash.  Allergic/Immunologic: Negative for environmental allergies.  Neurological: Negative for headaches.   Objective: BP 130/78   Pulse 73   Temp 97.6 F (36.4 C) (Temporal)   Resp 17   Ht  (1.6 m)   Wt 146 lb 12 oz (66.6 kg)   LMP 07/21/2008 (Approximate)   SpO2 97%   BMI 26.00 kg/m  Body mass index is 26 kg/m. Physical Exam Vitals and nursing note reviewed.  Constitutional:      Appearance: Normal appearance. She is well-developed.  HENT:     Head: Normocephalic and atraumatic.     Right Ear: Tympanic membrane and external ear normal.     Left Ear: Tympanic membrane and external ear normal.     Nose: Nose normal.     Mouth/Throat:     Mouth: Mucous membranes are moist.     Pharynx: Oropharynx is clear.  Eyes:     Conjunctiva/sclera: Conjunctivae normal.  Cardiovascular:     Rate  and Rhythm: Normal rate and regular rhythm.     Heart sounds: Normal heart sounds. No murmur heard.  No friction rub. No gallop.   Pulmonary:     Effort: Pulmonary effort is normal.     Breath sounds: Normal breath sounds. No wheezing, rhonchi or rales.  Abdominal:     Palpations: Abdomen is soft.  Musculoskeletal:     Cervical back: Neck supple.  Skin:    General: Skin is warm.     Findings: No rash.  Neurological:     Mental Status: She is alert and oriented to person, place, and time.  Psychiatric:        Behavior: Behavior normal.    The plan was reviewed with the patient/family, and all questions/concerned were addressed.  It was my pleasure to see Alexandria Rush today and participate in her care. Please feel free to contact me with any questions or concerns.  Sincerely,  Wyline Mood, DO Allergy &  Immunology  [facility] office: [phone] [facility] office: [phone] [place] office: [phone]

## 2020-02-28 ENCOUNTER — Ambulatory Visit: Payer: Medicare PPO | Admitting: Allergy

## 2020-02-28 ENCOUNTER — Other Ambulatory Visit: Payer: Self-pay

## 2020-02-28 ENCOUNTER — Encounter: Payer: Self-pay | Admitting: Allergy

## 2020-02-28 VITALS — BP 130/78 | HR 73 | Temp 97.6°F | Resp 17 | Ht 63.0 in | Wt 146.8 lb

## 2020-02-28 DIAGNOSIS — T781XXD Other adverse food reactions, not elsewhere classified, subsequent encounter: Secondary | ICD-10-CM

## 2020-02-28 DIAGNOSIS — T781XXA Other adverse food reactions, not elsewhere classified, initial encounter: Secondary | ICD-10-CM | POA: Insufficient documentation

## 2020-02-28 DIAGNOSIS — J310 Chronic rhinitis: Secondary | ICD-10-CM | POA: Insufficient documentation

## 2020-02-28 DIAGNOSIS — J4520 Mild intermittent asthma, uncomplicated: Secondary | ICD-10-CM | POA: Insufficient documentation

## 2020-02-28 DIAGNOSIS — T7819XA Other adverse food reactions, not elsewhere classified, initial encounter: Secondary | ICD-10-CM | POA: Insufficient documentation

## 2020-02-28 DIAGNOSIS — Z889 Allergy status to unspecified drugs, medicaments and biological substances status: Secondary | ICD-10-CM | POA: Diagnosis not present

## 2020-02-28 NOTE — Assessment & Plan Note (Signed)
Diagnosed with asthma over 20 years ago.  Main triggers are allergies and uses albuterol on a rare occasion with good benefit.  Today's spirometry showed some possible restriction.  May use albuterol rescue inhaler 2 puffs every 4 to 6 hours as needed for shortness of breath, chest tightness, coughing, and wheezing. Monitor frequency of use.

## 2020-02-28 NOTE — Assessment & Plan Note (Signed)
Patient is concerned about developing allergies to the Pfizer COVID-19 vaccine.  She developed her first injection and had some sinus drainage for 1 day.  Her second injection was followed by some chest tightness, rhinorrhea and voice hoarseness within 30 minutes and was in bed for 3 days for feeling lousy.  No prior COVID-19 diagnosis.  She had anaphylactic reaction to the flu vaccine in 2008 requiring 2 doses of epinephrine.  She also had anaphylactic reaction to penicillin pills requiring 2 doses of epinephrine.  Discussed with patient that she is fully vaccinated as of now for COVID-19. No indication for component testing for now.  If there are future boosters required, then recommend calling us first before receiving any future COVID-19 vaccines to see if she needs to undergo any testing at that time.   Continue to avoid drugs that are bothersome - penicillin, flu vaccines, tetracyclines, erythromycin, phenylephrine, tropicamide.   For mild symptoms you can take over the counter antihistamines such as Benadryl and monitor symptoms closely. If symptoms worsen or if you have severe symptoms including breathing issues, throat closure, significant swelling, whole body hives, severe diarrhea and vomiting, lightheadedness then inject epinephrine and seek immediate medical care afterwards.  Emergency action plan given.    Will check tryptase level at next visit.

## 2020-02-28 NOTE — Assessment & Plan Note (Addendum)
Currently avoiding dairy and peaches. Dairy causes wheezing and abdominal bloating.   Today's skin testing was negative to dairy, casein and peaches.  Continue to avoid the foods that bother your - milk and peaches.  Offered bloodwork for milk and peach IgE but patient declines.  For mild symptoms you can take over the counter antihistamines such as Benadryl and monitor symptoms closely. If symptoms worsen or if you have severe symptoms including breathing issues, throat closure, significant swelling, whole body hives, severe diarrhea and vomiting, lightheadedness then seek immediate medical care.

## 2020-02-28 NOTE — Patient Instructions (Addendum)
Today's skin testing was negative to indoor/outdoor allergens, milk, casein and peach.   Multiple drug allergies:  Continue to avoid drugs that bother you - penicillin, flu vaccines, tetracyclines, erythromycin, phenylephrine, tropicamide.   Regarding the COVID-19 vaccine - as of now you are fully vaccinated which is great.  If there are future boosters required, then recommend calling us first before receiving any future COVID-19 vaccines.   For mild symptoms you can take over the counter antihistamines such as Benadryl and monitor symptoms closely. If symptoms worsen or if you have severe symptoms including breathing issues, throat closure, significant swelling, whole body hives, severe diarrhea and vomiting, lightheadedness then inject epinephrine and seek immediate medical care afterwards.  Emergency action plan given.    Will check tryptase level at next visit.   Food:  Continue to avoid the foods that bother your - milk and peaches.  Asthma:  May use albuterol rescue inhaler 2 puffs every 4 to 6 hours as needed for shortness of breath, chest tightness, coughing, and wheezing. Monitor frequency of use.    Rhinitis:  May use over the counter antihistamines such as Zyrtec (cetirizine), Claritin (loratadine), Allegra (fexofenadine), or Xyzal (levocetirizine) daily as needed.  Follow up as needed.

## 2020-02-28 NOTE — Assessment & Plan Note (Signed)
Rhinoconjunctivitis symptoms in the spring and early summer.  Takes Benadryl and Zyrtec with good benefit.    Today's skin prick testing was negative to indoor and outdoor allergens.  May use over the counter antihistamines such as Zyrtec (cetirizine), Claritin (loratadine), Allegra (fexofenadine), or Xyzal (levocetirizine) daily as needed.

## 2020-04-19 ENCOUNTER — Encounter: Payer: Self-pay | Admitting: Family Medicine

## 2020-04-23 ENCOUNTER — Ambulatory Visit: Payer: Medicare PPO | Attending: Internal Medicine

## 2020-04-23 ENCOUNTER — Other Ambulatory Visit: Payer: Self-pay

## 2020-04-23 ENCOUNTER — Other Ambulatory Visit (HOSPITAL_BASED_OUTPATIENT_CLINIC_OR_DEPARTMENT_OTHER): Payer: Self-pay | Admitting: Internal Medicine

## 2020-04-23 DIAGNOSIS — Z23 Encounter for immunization: Secondary | ICD-10-CM

## 2020-04-23 NOTE — Progress Notes (Signed)
   Covid-19 Vaccination Clinic  Name:  ALEIDA CRANDELL    MRN: [id number] DOB: March 18, 1951  04/23/2020  Ms. Flagler was observed post Covid-19 immunization for 30 minutes based on pre-vaccination screening without incident. She was provided with Vaccine Information Sheet and instruction to access the V-Safe system. Vaccinated by Theodis Sato.  Ms. Matto was instructed to call 911 with any severe reactions post vaccine: Marland Kitchen Difficulty breathing  . Swelling of face and throat  . A fast heartbeat  . A bad rash all over body  . Dizziness and weakness

## 2020-04-24 ENCOUNTER — Other Ambulatory Visit (HOSPITAL_BASED_OUTPATIENT_CLINIC_OR_DEPARTMENT_OTHER): Payer: Self-pay | Admitting: Family Medicine

## 2020-04-24 DIAGNOSIS — Z1231 Encounter for screening mammogram for malignant neoplasm of breast: Secondary | ICD-10-CM

## 2020-04-27 MED FILL — PFIZER-BIONTECH COVID-19 VA: 30 | 1 days supply | Qty: 0 | Fill #0

## 2020-05-28 ENCOUNTER — Ambulatory Visit (INDEPENDENT_AMBULATORY_CARE_PROVIDER_SITE_OTHER): Payer: Medicare PPO | Admitting: Family Medicine

## 2020-05-28 ENCOUNTER — Encounter: Payer: Self-pay | Admitting: Family Medicine

## 2020-05-28 ENCOUNTER — Other Ambulatory Visit: Payer: Self-pay

## 2020-05-28 VITALS — BP 124/79 | HR 69 | Temp 98.3°F | Ht 62.0 in | Wt 149.0 lb

## 2020-05-28 DIAGNOSIS — Z131 Encounter for screening for diabetes mellitus: Secondary | ICD-10-CM | POA: Diagnosis not present

## 2020-05-28 DIAGNOSIS — B9689 Other specified bacterial agents as the cause of diseases classified elsewhere: Secondary | ICD-10-CM

## 2020-05-28 DIAGNOSIS — R748 Abnormal levels of other serum enzymes: Secondary | ICD-10-CM | POA: Diagnosis not present

## 2020-05-28 DIAGNOSIS — E663 Overweight: Secondary | ICD-10-CM

## 2020-05-28 DIAGNOSIS — E559 Vitamin D deficiency, unspecified: Secondary | ICD-10-CM | POA: Diagnosis not present

## 2020-05-28 DIAGNOSIS — Z0000 Encounter for general adult medical examination without abnormal findings: Secondary | ICD-10-CM

## 2020-05-28 DIAGNOSIS — I10 Essential (primary) hypertension: Secondary | ICD-10-CM | POA: Diagnosis not present

## 2020-05-28 DIAGNOSIS — J329 Chronic sinusitis, unspecified: Secondary | ICD-10-CM | POA: Diagnosis not present

## 2020-05-28 DIAGNOSIS — M810 Age-related osteoporosis without current pathological fracture: Secondary | ICD-10-CM

## 2020-05-28 MED ORDER — ALENDRONATE SODIUM 70 MG PO TABS
70.0000 mg | ORAL_TABLET | ORAL | 11 refills | Status: DC
Start: 2020-05-28 — End: 2020-10-31

## 2020-05-28 MED ORDER — ALBUTEROL SULFATE HFA 108 (90 BASE) MCG/ACT IN AERS: 2.0000 | INHALATION_SPRAY | Freq: Four times a day (QID) | RESPIRATORY_TRACT | 11 refills | Status: DC | PRN

## 2020-05-28 MED ORDER — AMLODIPINE BESYLATE 5 MG PO TABS
5.0000 mg | ORAL_TABLET | Freq: Every day | ORAL | 1 refills | Status: DC
Start: 2020-05-28 — End: 2020-10-31

## 2020-05-28 MED ORDER — SULFAMETHOXAZOLE-TRIMETHOPRIM 800-160 MG PO TABS: 1.0000 | ORAL_TABLET | Freq: Two times a day (BID) | ORAL | 0 refills | Status: DC

## 2020-05-28 NOTE — Progress Notes (Signed)
This visit occurred during the SARS-CoV-2 public health emergency.  Safety protocols were in place, including screening questions prior to the visit, additional usage of staff PPE, and extensive cleaning of exam room while observing appropriate contact time as indicated for disinfecting solutions.    Patient ID: Alexandria Rush, female  DOB: Jul 29, 1950, 69 y.o.   MRN: [id number] Patient Care Team    Relationship Specialty Notifications Start End  Ma Hillock, DO PCP - General Family Medicine  08/20/18   Tanda Rockers, MD Consulting Physician Pulmonary Disease  08/20/18   Clair Gulling, MD    08/20/18    Comment: thoracic surgeon  Clarene Essex, MD Consulting Physician Gastroenterology  08/20/18     Chief Complaint  Patient presents with  . Annual Exam    pt is fasting    Subjective:  Alexandria Rush is a 69 y.o.  Female  present for CPE. All past medical history, surgical history, allergies, family history, immunizations, medications and social history were updated in the electronic medical record today. All recent labs, ED visits and hospitalizations within the last year were reviewed.  Health maintenance:  Colonoscopy: completed 2019, by [facility], f/u - 5-10 years Mammogram: completed:05/2019, >> ordered 05/25/2019 Cervical cancer screening: N/A. Pt would like referral to GYN>> referral placed.  Immunizations: tdap UTD 2015, Influenza declined (encouraged yearly), PNA series decliend, shingrix allergy- declined. covid series completed Infectious disease screening: Hep C completed.  DEXA: 06/03/2019- rpt 2 yrs.  Assistive device: none Oxygen XBL:TJQZ Patient has a Dental home. Hospitalizations/ED visits: reviewed  Hypertension:  Pt reports compliance with amlodipine 5 mg daily. Patient denies chest pain, shortness of breath, dizziness or lower extremity edema. Pt does not take a daily baby ASA. Pt is is not prescribed statin. Diet: Low-sodium Exercise: Routine exercise.    Osteoporosis: DEXA with osteoporosis.  Patient also had elevated alkaline phosphatase on labs.  She has started the vitamin D and calcium, as well as Fosamax for her osteoporosis.  She is tolerating the Fosamax.  She has no nausea, vomiting, abdominal pain.   Depression screen [facility] 2/9 05/28/2020 05/26/2019 05/25/2019 08/20/2018  Decreased Interest 0 0 0 0  Down, Depressed, Hopeless 0 0 0 0  PHQ - 2 Score 0 0 0 0   No flowsheet data found.   Immunization History  Administered Date(s) Administered  . PFIZER SARS-COV-2 Vaccination 09/16/2019, 10/12/2019, 04/23/2020   Past Medical History:  Diagnosis Date  . Anemia   . Asthma   . Chicken pox   . GERD (gastroesophageal reflux disease)   . Hypertension   . MVP (mitral valve prolapse)   . Rib fracture 08/27/2017   left 6-9 rib fx- seen of f/u CT- xray was normal   Allergies  Allergen Reactions  . Erythromycin Anaphylaxis  . Influenza Vaccines Anaphylaxis  . Penicillins Anaphylaxis  . Phenylephrine Anaphylaxis  . Tetracycline Anaphylaxis  . Tropicamide Swelling  . Benzalkonium Chloride Rash  . Fosamax [Alendronate] Nausea Only    Worsened GERD and stomach upset.   . Lactase     Other reaction(s): Wheezing Other reaction(s): Wheezing   . Lactose Intolerance (Gi) Swelling    wheezing  . Peach [Prunus Persica] Swelling  . Pneumococcal Vaccine Other (See Comments)    Pt doesn't take this vaccine as a precaution due to other allergens she has. Other reaction(s): Other (See Comments) Pt doesn't take this vaccine as a precaution due to other allergens she has.   . Bee Venom Rash  .  Lac Bovis Nausea And Vomiting  . Neomycin-Bacitracin Zn-Polymyx Rash   Past Surgical History:  Procedure Laterality Date  . ADENOIDECTOMY    . APPENDECTOMY    . COLONOSCOPY  2019  . HYSTEROSCOPY    . NASAL SINUS SURGERY  2014  . RIB FRACTURE SURGERY  2019  . TONSILLECTOMY     Family History  Problem Relation Age of Onset  . Stomach cancer  Mother   . Cancer Mother   . Hypertension Father   . Heart disease Father   . Stroke Father   . Diabetes Brother   . Allergies Brother   . Asthma Brother   . Allergic rhinitis Brother   . Diabetes Paternal Aunt   . Hypertension Brother   . Allergies Sister   . Asthma Sister   . Diabetes Sister   . Allergic rhinitis Sister   . Allergies Son   . Allergies Daughter   . Cancer Maternal Aunt   . Stroke Maternal Uncle   . Cancer Paternal Aunt        breast  . Breast cancer Paternal Aunt   . Cancer Maternal Uncle        pancreatic   Social History   Social History Narrative   Marital status/children/pets: married, 2 children. From [facility]   Education/employment: masters degree. Retired- Sr. Quarry manager:      -smoke alarm in the home:Yes     - wears seatbelt: Yes     - Feels safe in their relationships: Yes    Allergies as of 05/28/2020      Reactions   Erythromycin Anaphylaxis   Influenza Vaccines Anaphylaxis   Penicillins Anaphylaxis   Phenylephrine Anaphylaxis   Tetracycline Anaphylaxis   Tropicamide Swelling   Benzalkonium Chloride Rash   Fosamax [alendronate] Nausea Only   Worsened GERD and stomach upset.    Lactase    Other reaction(s): Wheezing Other reaction(s): Wheezing   Lactose Intolerance (gi) Swelling   wheezing   Peach [prunus Persica] Swelling   Pneumococcal Vaccine Other (See Comments)   Pt doesn't take this vaccine as a precaution due to other allergens she has. Other reaction(s): Other (See Comments) Pt doesn't take this vaccine as a precaution due to other allergens she has.   Bee Venom Rash   Lac Bovis Nausea And Vomiting   Neomycin-bacitracin Zn-polymyx Rash      Medication List       Accurate as of May 28, 2020  9:25 AM. If you have any questions, ask your nurse or doctor.        albuterol 108 (90 Base) MCG/ACT inhaler Commonly known as: Ventolin HFA Inhale 2 puffs into the lungs every 6 (six) hours as needed for wheezing  or shortness of breath.   alendronate 70 MG tablet Commonly known as: FOSAMAX Take 1 tablet (70 mg total) by mouth once a week. What changed:   how much to take  how to take this  when to take this  Another medication with the same name was removed. Continue taking this medication, and follow the directions you see here. Changed by: Howard Pouch, DO   amLODipine 5 MG tablet Commonly known as: NORVASC Take 1 tablet (5 mg total) by mouth daily.   CALCIUM CARBONATE-VITAMIN D3 PO Take by mouth.   EPINEPHrine 0.3 mg/0.3 mL Soaj injection Commonly known as: EpiPen 2-Pak Inject 0.3 mLs (0.3 mg total) into the muscle as needed for anaphylaxis.   famotidine 20 MG tablet Commonly  known as: Pepcid One at bedtime   KP Fish Oil 1200 MG Caps Take by mouth.   sulfamethoxazole-trimethoprim 800-160 MG tablet Commonly known as: BACTRIM DS Take 1 tablet by mouth 2 (two) times daily. Started by: Howard Pouch, DO       All past medical history, surgical history, allergies, family history, immunizations andmedications were updated in the EMR today and reviewed under the history and medication portions of their EMR.     No results found for this or any previous visit (from the past 2160 hour(s)).  No results found.   ROS: 14 pt review of systems performed and negative (unless mentioned in an HPI)  Objective: BP 124/79   Pulse 69   Temp 98.3 F (36.8 C) (Oral)   Ht 5' 2"  (1.575 m)   Wt 149 lb (67.6 kg)   LMP 07/21/2008 (Approximate)   SpO2 98%   BMI 27.25 kg/m  Gen: Afebrile. No acute distress. Nontoxic in appearance, well-developed, well-nourished,  Pleasant female.  HENT: AT. [place]. Bilateral TM visualized and normal in appearance, normal external auditory canal. MMM, no oral lesions, adequate dentition. Bilateral nares within normal limits. Throat without erythema, ulcerations or exudates. no Cough on exam, no hoarseness on exam. Eyes:Pupils Equal Round Reactive to light,  Extraocular movements intact,  Conjunctiva without redness, discharge or icterus. Neck/lymp/endocrine: Supple,no lymphadenopathy, no thyromegaly CV: RRR no murmur, no edema, +2/4 P posterior tibialis pulses.  Chest: CTAB, no wheeze, rhonchi or crackles. normal Respiratory effort. good Air movement. Abd: Soft. falt. NTND. BS present. no Masses palpated. No hepatosplenomegaly. No rebound tenderness or guarding. Skin: no rashes, purpura or petechiae. Warm and well-perfused. Skin intact. Neuro/Msk:  Normal gait. PERLA. EOMi. Alert. Oriented x3.  Cranial nerves II through XII intact. Muscle strength 5/5 upper/lower extremity. DTRs equal bilaterally. Psych: Normal affect, dress and demeanor. Normal speech. Normal thought content and judgment.  No exam data present  Assessment/plan: ADAMARIS KING is a 69 y.o. female present for  CPE  Benign essential HTN - stable.  - continue amlodipine to 5 mg QD.  -Routine exercise and low-sodium diet discussed. -Follow-up 5.5 mos  Osteoporosis without current pathological fracture, unspecified osteoporosis type/Elevated alkaline phosphatase level Taking Fosamax again. -Repeat DEXA in 05/2021 -Discussed elevated alk phos possibly secondary to osteoporosis and vitamin D insufficiency.  - Vit d 1000u and ca 1200 mg by diet or supplement recommended.  - GGT - normal.  - Vitamin D (25 hydroxy) collected today - Comp Met (CMET) collected today.  - continue exercise programs.   Diabetes mellitus screening - Hemoglobin A1c  Bacterial sinusitis:  > 2 weeks of sx.  Trying OTC products and took "leftover" steroids.  bactrim DS BID x 7 days prescribed.   Routine general medical examination at a health care facility Patient was encouraged to exercise greater than 150 minutes a week. Patient was encouraged to choose a diet filled with fresh fruits and vegetables, and lean meats. AVS provided to patient today for education/recommendation on gender specific  health and safety maintenance. Colonoscopy: completed 2019, by [facility], f/u - 5-10 years Mammogram: completed:05/2019, >> ordered 05/25/2019 Cervical cancer screening: N/A. Pt would like referral to GYN>> referral placed.  Immunizations: tdap UTD 2015, Influenza declined (encouraged yearly), PNA series decliend, shingrix allergy- declined. covid series completed Infectious disease screening: Hep C completed.  DEXA: 06/03/2019- rpt 2 yrs. Return in about 1 year (around 05/28/2021) for CPE (30 min) and 5.5 mos chronic condition. .  Orders Placed This Encounter  Procedures  . CBC with Differential/Platelet  . Hemoglobin A1c  . Comprehensive metabolic panel  . Lipid panel  . TSH  . Vitamin D (25 hydroxy)    Meds ordered this encounter  Medications  . amLODipine (NORVASC) 5 MG tablet    Sig: Take 1 tablet (5 mg total) by mouth daily.    Dispense:  90 tablet    Refill:  1  . albuterol (VENTOLIN HFA) 108 (90 Base) MCG/ACT inhaler    Sig: Inhale 2 puffs into the lungs every 6 (six) hours as needed for wheezing or shortness of breath.    Dispense:  8 g    Refill:  11  . alendronate (FOSAMAX) 70 MG tablet    Sig: Take 1 tablet (70 mg total) by mouth once a week.    Dispense:  4 tablet    Refill:  11  . sulfamethoxazole-trimethoprim (BACTRIM DS) 800-160 MG tablet    Sig: Take 1 tablet by mouth 2 (two) times daily.    Dispense:  14 tablet    Refill:  0   Referral Orders  No referral(s) requested today     Electronically signed by: Howard Pouch, [place]

## 2020-05-28 NOTE — Addendum Note (Signed)
Addended by: Emi Holes D on: 05/28/2020 09:37 AM   Modules accepted: Orders

## 2020-05-28 NOTE — Addendum Note (Signed)
Addended by: Emi Holes D on: 05/28/2020 09:34 AM   Modules accepted: Orders

## 2020-05-28 NOTE — Patient Instructions (Signed)
Health Maintenance, Female Adopting a healthy lifestyle and getting preventive care are important in promoting health and wellness. Ask your health care provider about:  The right schedule for you to have regular tests and exams.  Things you can do on your own to prevent diseases and keep yourself healthy. What should I know about diet, weight, and exercise? Eat a healthy diet   Eat a diet that includes plenty of vegetables, fruits, low-fat dairy products, and lean protein.  Do not eat a lot of foods that are high in solid fats, added sugars, or sodium. Maintain a healthy weight Body mass index (BMI) is used to identify weight problems. It estimates body fat based on height and weight. Your health care provider can help determine your BMI and help you achieve or maintain a healthy weight. Get regular exercise Get regular exercise. This is one of the most important things you can do for your health. Most adults should:  Exercise for at least 150 minutes each week. The exercise should increase your heart rate and make you sweat (moderate-intensity exercise).  Do strengthening exercises at least twice a week. This is in addition to the moderate-intensity exercise.  Spend less time sitting. Even light physical activity can be beneficial. Watch cholesterol and blood lipids Have your blood tested for lipids and cholesterol at 68 years of age, then have this test every 5 years. Have your cholesterol levels checked more often if:  Your lipid or cholesterol levels are high.  You are older than 68 years of age.  You are at high risk for heart disease. What should I know about cancer screening? Depending on your health history and family history, you may need to have cancer screening at various ages. This may include screening for:  Breast cancer.  Cervical cancer.  Colorectal cancer.  Skin cancer.  Lung cancer. What should I know about heart disease, diabetes, and high blood  pressure? Blood pressure and heart disease  High blood pressure causes heart disease and increases the risk of stroke. This is more likely to develop in people who have high blood pressure readings, are of African descent, or are overweight.  Have your blood pressure checked: ? Every 3-5 years if you are 18-39 years of age. ? Every year if you are 40 years old or older. Diabetes Have regular diabetes screenings. This checks your fasting blood sugar level. Have the screening done:  Once every three years after age 40 if you are at a normal weight and have a low risk for diabetes.  More often and at a younger age if you are overweight or have a high risk for diabetes. What should I know about preventing infection? Hepatitis B If you have a higher risk for hepatitis B, you should be screened for this virus. Talk with your health care provider to find out if you are at risk for hepatitis B infection. Hepatitis C Testing is recommended for:  Everyone born from 1945 through 1965.  Anyone with known risk factors for hepatitis C. Sexually transmitted infections (STIs)  Get screened for STIs, including gonorrhea and chlamydia, if: ? You are sexually active and are younger than 68 years of age. ? You are older than 68 years of age and your health care provider tells you that you are at risk for this type of infection. ? Your sexual activity has changed since you were last screened, and you are at increased risk for chlamydia or gonorrhea. Ask your health care provider if   you are at risk.  Ask your health care provider about whether you are at high risk for HIV. Your health care provider may recommend a prescription medicine to help prevent HIV infection. If you choose to take medicine to prevent HIV, you should first get tested for HIV. You should then be tested every 3 months for as long as you are taking the medicine. Pregnancy  If you are about to stop having your period (premenopausal) and  you may become pregnant, seek counseling before you get pregnant.  Take 400 to 800 micrograms (mcg) of folic acid every day if you become pregnant.  Ask for birth control (contraception) if you want to prevent pregnancy. Osteoporosis and menopause Osteoporosis is a disease in which the bones lose minerals and strength with aging. This can result in bone fractures. If you are 65 years old or older, or if you are at risk for osteoporosis and fractures, ask your health care provider if you should:  Be screened for bone loss.  Take a calcium or vitamin D supplement to lower your risk of fractures.  Be given hormone replacement therapy (HRT) to treat symptoms of menopause. Follow these instructions at home: Lifestyle  Do not use any products that contain nicotine or tobacco, such as cigarettes, e-cigarettes, and chewing tobacco. If you need help quitting, ask your health care provider.  Do not use street drugs.  Do not share needles.  Ask your health care provider for help if you need support or information about quitting drugs. Alcohol use  Do not drink alcohol if: ? Your health care provider tells you not to drink. ? You are pregnant, may be pregnant, or are planning to become pregnant.  If you drink alcohol: ? Limit how much you use to 0-1 drink a day. ? Limit intake if you are breastfeeding.  Be aware of how much alcohol is in your drink. In the [place] one drink equals one 12 oz bottle of beer (355 mL), one 5 oz glass of wine (148 mL), or one 1 oz glass of hard liquor (44 mL). General instructions  Schedule regular health, dental, and eye exams.  Stay current with your vaccines.  Tell your health care provider if: ? You often feel depressed. ? You have ever been abused or do not feel safe at home. Summary  Adopting a healthy lifestyle and getting preventive care are important in promoting health and wellness.  Follow your health care provider's instructions about healthy  diet, exercising, and getting tested or screened for diseases.  Follow your health care provider's instructions on monitoring your cholesterol and blood pressure. This information is not intended to replace advice given to you by your health care provider. Make sure you discuss any questions you have with your health care provider. Document Revised: 06/30/2018 Document Reviewed: 06/30/2018 Elsevier Patient Education  2020 [employer]vier Inc.  

## 2020-05-30 ENCOUNTER — Ambulatory Visit: Payer: Medicare PPO

## 2020-06-04 ENCOUNTER — Ambulatory Visit (INDEPENDENT_AMBULATORY_CARE_PROVIDER_SITE_OTHER): Payer: Medicare PPO

## 2020-06-04 ENCOUNTER — Other Ambulatory Visit: Payer: Self-pay

## 2020-06-04 ENCOUNTER — Ambulatory Visit (HOSPITAL_BASED_OUTPATIENT_CLINIC_OR_DEPARTMENT_OTHER)
Admission: RE | Admit: 2020-06-04 | Discharge: 2020-06-04 | Disposition: A | Discharge status: Home / Self Care | Payer: Medicare PPO | Source: Ambulatory Visit | Attending: Family Medicine | Admitting: Family Medicine

## 2020-06-04 DIAGNOSIS — Z1231 Encounter for screening mammogram for malignant neoplasm of breast: Secondary | ICD-10-CM | POA: Diagnosis not present

## 2020-06-04 DIAGNOSIS — M810 Age-related osteoporosis without current pathological fracture: Secondary | ICD-10-CM

## 2020-06-04 DIAGNOSIS — Z131 Encounter for screening for diabetes mellitus: Secondary | ICD-10-CM

## 2020-06-04 DIAGNOSIS — I10 Essential (primary) hypertension: Secondary | ICD-10-CM

## 2020-06-04 LAB — CBC WITH DIFFERENTIAL/PLATELET
Basophils Absolute: 0 10*3/uL (ref 0.0–0.1)
Basophils Relative: 0.6 % (ref 0.0–3.0)
Eosinophils Absolute: 0.1 10*3/uL (ref 0.0–0.7)
Eosinophils Relative: 2.8 % (ref 0.0–5.0)
HCT: 38.8 % (ref 36.0–46.0)
Hemoglobin: 12.5 g/dL (ref 12.0–15.0)
Lymphocytes Relative: 43.7 % (ref 12.0–46.0)
Lymphs Abs: 2.1 10*3/uL (ref 0.7–4.0)
MCHC: 32.3 g/dL (ref 30.0–36.0)
MCV: 98 fl (ref 78.0–100.0)
Monocytes Absolute: 0.6 10*3/uL (ref 0.1–1.0)
Monocytes Relative: 12.8 % — ABNORMAL HIGH (ref 3.0–12.0)
Neutro Abs: 1.9 10*3/uL (ref 1.4–7.7)
Neutrophils Relative %: 40.1 % — ABNORMAL LOW (ref 43.0–77.0)
Platelets: 247 10*3/uL (ref 150.0–400.0)
RBC: 3.95 Mil/uL (ref 3.87–5.11)
RDW: 14.1 % (ref 11.5–15.5)
WBC: 4.8 10*3/uL (ref 4.0–10.5)

## 2020-06-04 LAB — LIPID PANEL
Cholesterol: 174 mg/dL (ref 0–200)
HDL: 43.2 mg/dL (ref >39.00)
LDL Cholesterol: 106 mg/dL — ABNORMAL HIGH (ref 0–99)
NonHDL: 131.01
Total CHOL/HDL Ratio: 4
Triglycerides: 126 mg/dL (ref 0.0–149.0)
VLDL: 25.2 mg/dL (ref 0.0–40.0)

## 2020-06-04 LAB — TSH: TSH: 2.59 u[IU]/mL (ref 0.35–4.50)

## 2020-06-04 LAB — VITAMIN D 25 HYDROXY (VIT D DEFICIENCY, FRACTURES): VITD: 21.76 ng/mL — ABNORMAL LOW (ref 30.00–100.00)

## 2020-06-04 LAB — COMPREHENSIVE METABOLIC PANEL
ALT: 10 U/L (ref 0–35)
AST: 12 U/L (ref 0–37)
Albumin: 4.2 g/dL (ref 3.5–5.2)
Alkaline Phosphatase: 65 U/L (ref 39–117)
BUN: 18 mg/dL (ref 6–23)
CO2: 27 mEq/L (ref 19–32)
Calcium: 8.9 mg/dL (ref 8.4–10.5)
Chloride: 104 mEq/L (ref 96–112)
Creatinine, Ser: 1.05 mg/dL (ref 0.40–1.20)
GFR: 54.71 mL/min — ABNORMAL LOW (ref >60.00)
Glucose, Bld: 85 mg/dL (ref 70–99)
Potassium: 4.8 mEq/L (ref 3.5–5.1)
Sodium: 139 mEq/L (ref 135–145)
Total Bilirubin: 0.3 mg/dL (ref 0.2–1.2)
Total Protein: 6.8 g/dL (ref 6.0–8.3)

## 2020-06-04 LAB — HEMOGLOBIN A1C: Hgb A1c MFr Bld: 6 % (ref 4.6–6.5)

## 2020-06-05 ENCOUNTER — Telehealth: Payer: Self-pay | Admitting: Family Medicine

## 2020-06-05 DIAGNOSIS — R944 Abnormal results of kidney function studies: Secondary | ICD-10-CM

## 2020-06-05 NOTE — Telephone Encounter (Signed)
  Please call patient Liver and thyroid function are normal Blood cell counts and electrolytes are normal Diabetes screening/A1c is 6.0.  Technically this is in the prediabetic range.  However her fasting glucose was in the normal range.  Your A1c was 5.7.  No medications needed at this time but increasing exercise and cutting back on sweets and carbohydrate loaded meals would be helpful. Cholesterol panel looks great and is at goal.  Vitamin D is low at 21.7.  I would encourage her to increase her vitamin D supplementation by 1000 units of vitamin D daily.  Lastly, her kidney function is mildly decreased from prior collections.  This may be secondary to her recent use of Bactrim for an acute infection.  I would encouraged her to hydrate, drink plenty of water and we will recheck her kidney function in 2 weeks by lab appointment.  I would encourage her to be well-hydrated at the day of her lab appointment as well.

## 2020-06-05 NOTE — Telephone Encounter (Signed)
Spoke with pt regarding labs and instructions.   

## 2020-06-05 NOTE — Progress Notes (Signed)
Subjective:   Alexandria Rush is a 69 y.o. female who presents for Medicare Annual (Subsequent) preventive examination.  I connected with Virjean today by telephone and verified that I am speaking with the correct person using two identifiers. Location patient: home Location provider: work Persons participating in the virtual visit: patient, Engineer, civil (consulting).    I discussed the limitations, risks, security and privacy concerns of performing an evaluation and management service by telephone and the availability of in person appointments. I also discussed with the patient that there may be a patient responsible charge related to this service. The patient expressed understanding and verbally consented to this telephonic visit.    Interactive audio and video telecommunications were attempted between this provider and patient, however failed, due to patient having technical difficulties OR patient did not have access to video capability.  We continued and completed visit with audio only.  Some vital signs may be absent or patient reported.   Time Spent with patient on telephone encounter: 20 minutes   Review of Systems     Cardiac Risk Factors include: advanced age (>28men, >52 women);hypertension     Objective:    Today's Vitals   06/06/20 0816  Weight: 149 lb (67.6 kg)  Height: 5\' 2"  (1.575 m)   Body mass index is 27.25 kg/m.  Advanced Directives 06/06/2020 05/25/2019  Does Patient Have a Medical Advance Directive? Yes Yes  Type of 13/10/2018 of [place];Living will Healthcare Power of [place];Living will  Copy of Healthcare Power of Attorney in Chart? Yes - validated most recent copy scanned in chart (See row information) No - copy requested    Current Medications (verified) Outpatient Encounter Medications as of 06/06/2020  Medication Sig  . albuterol (VENTOLIN HFA) 108 (90 Base) MCG/ACT inhaler Inhale 2 puffs into the lungs every 6 (six) hours as needed for  wheezing or shortness of breath.  06/08/2020 alendronate (FOSAMAX) 70 MG tablet Take 1 tablet (70 mg total) by mouth once a week.  Marland Kitchen amLODipine (NORVASC) 5 MG tablet Take 1 tablet (5 mg total) by mouth daily.  . Calcium Carb-Cholecalciferol (CALCIUM CARBONATE-VITAMIN D3 PO) Take by mouth.  . EPINEPHrine (EPIPEN 2-PAK) 0.3 mg/0.3 mL IJ SOAJ injection Inject 0.3 mLs (0.3 mg total) into the muscle as needed for anaphylaxis.  . famotidine (PEPCID) 20 MG tablet One at bedtime  . Omega-3 Fatty Acids (KP FISH OIL) 1200 MG CAPS Take by mouth.  . [DISCONTINUED] sulfamethoxazole-trimethoprim (BACTRIM DS) 800-160 MG tablet Take 1 tablet by mouth 2 (two) times daily.   No facility-administered encounter medications on file as of 06/06/2020.    Allergies (verified) Erythromycin, Influenza vaccines, Penicillins, Phenylephrine, Tetracycline, Tropicamide, Benzalkonium chloride, Fosamax [alendronate], Lactase, Lactose intolerance (gi), Peach [prunus persica], Pneumococcal vaccine, Bee venom, Lac bovis, and Neomycin-bacitracin zn-polymyx   History: Past Medical History:  Diagnosis Date  . Anemia   . Asthma   . Chicken pox   . GERD (gastroesophageal reflux disease)   . Hypertension   . MVP (mitral valve prolapse)   . Rib fracture 08/27/2017   left 6-9 rib fx- seen of f/u CT- xray was normal   Past Surgical History:  Procedure Laterality Date  . ADENOIDECTOMY    . APPENDECTOMY    . COLONOSCOPY  2019  . HYSTEROSCOPY    . NASAL SINUS SURGERY  2014  . RIB FRACTURE SURGERY  2019  . TONSILLECTOMY     Family History  Problem Relation Age of Onset  . Stomach cancer Mother   .  Cancer Mother   . Hypertension Father   . Heart disease Father   . Stroke Father   . Diabetes Brother   . Allergies Brother   . Asthma Brother   . Allergic rhinitis Brother   . Diabetes Paternal Aunt   . Hypertension Brother   . Allergies Sister   . Asthma Sister   . Diabetes Sister   . Allergic rhinitis Sister   . Allergies  Son   . Allergies Daughter   . Cancer Maternal Aunt   . Stroke Maternal Uncle   . Cancer Paternal Aunt        breast  . Breast cancer Paternal Aunt   . Cancer Maternal Uncle        pancreatic   Social History   Socioeconomic History  . Marital status: Married    Spouse name: Not on file  . Number of children: Not on file  . Years of education: Not on file  . Highest education level: Not on file  Occupational History  . Occupation: Teaching laboratory technician: [employer]  Tobacco Use  . Smoking status: Never Smoker  . Smokeless tobacco: Never Used  Vaping Use  . Vaping Use: Never used  Substance and Sexual Activity  . Alcohol use: Yes    Alcohol/week: 0.0 standard drinks    Comment: socially/occ  . Drug use: No  . Sexual activity: Yes    Birth control/protection: Post-menopausal    Comment: 1st intercourse 69 yo-Fewer than 5 partners  Other Topics Concern  . Not on file  Social History Narrative   Marital status/children/pets: married, 2 children. From [facility]   Education/employment: masters degree. Retired- Sr. Conservator, museum/gallery:      -smoke alarm in the home:Yes     - wears seatbelt: Yes     - Feels safe in their relationships: Yes   Social Determinants of Health   Financial Resource Strain: Low Risk   . Difficulty of Paying Living Expenses: Not hard at all  Food Insecurity: No Food Insecurity  . Worried About Programme researcher, broadcasting/film/video in the Last Year: Never true  . Ran Out of Food in the Last Year: Never true  Transportation Needs: No Transportation Needs  . Lack of Transportation (Medical): No  . Lack of Transportation (Non-Medical): No  Physical Activity: Sufficiently Active  . Days of Exercise per Week: 7 days  . Minutes of Exercise per Session: 120 min  Stress: No Stress Concern Present  . Feeling of Stress : Not at all  Social Connections: Moderately Integrated  . Frequency of Communication with Friends and Family: More than three times a week  .  Frequency of Social Gatherings with Friends and Family: Once a week  . Attends Religious Services: 1 to 4 times per year  . Active Member of Clubs or Organizations: No  . Attends Banker Meetings: Never  . Marital Status: Married    Tobacco Counseling Counseling given: Not Answered   Clinical Intake:  Pre-visit preparation completed: Yes  Pain : No/denies pain     Nutritional Status: BMI 25 -29 Overweight Nutritional Risks: None Diabetes: No  How often do you need to have someone help you when you read instructions, pamphlets, or other written materials from your doctor or pharmacy?: 1 - Never What is the last grade level you completed in school?: Master's degree  Diabetic?No  Interpreter Needed?: No  Information entered by :: Thomasenia Sales LPN  Activities of Daily Living In your present state of health, do you have any difficulty performing the following activities: 06/06/2020  Hearing? N  Vision? N  Difficulty concentrating or making decisions? N  Walking or climbing stairs? N  Dressing or bathing? N  Doing errands, shopping? N  Preparing Food and eating ? N  Using the Toilet? N  In the past six months, have you accidently leaked urine? N  Do you have problems with loss of bowel control? N  Managing your Medications? N  Managing your Finances? N  Housekeeping or managing your Housekeeping? N  Some recent data might be hidden    Patient Care Team: Natalia Leatherwood, DO as PCP - General (Family Medicine) Nyoka Cowden, MD as Consulting Physician (Pulmonary Disease) Janyth Contes, MD Vida Rigger, MD as Consulting Physician (Gastroenterology)  Indicate any recent Medical Services you may have received from other than [facility] providers in the past year (date may be approximate).     Assessment:   This is a routine wellness examination for Anastasija.  Hearing/Vision screen  Hearing Screening   125Hz  250Hz  500Hz  1000Hz  2000Hz  3000Hz  4000Hz  6000Hz   8000Hz   Right ear:           Left ear:           Comments: No issues  Vision Screening Comments: Wears glasses Last eye exam-2 yrs ago  Dietary issues and exercise activities discussed: Current Exercise Habits: Home exercise routine, Type of exercise: walking, Time (Minutes): 60 (2-3 hours daily), Frequency (Times/Week): 7, Weekly Exercise (Minutes/Week): 420, Intensity: Mild  Goals    . Patient Stated     Drink more water and maintain current level of activity      Depression Screen PHQ 2/9 Scores 06/06/2020 05/28/2020 05/26/2019 05/25/2019 08/20/2018  PHQ - 2 Score 0 0 0 0 0    Fall Risk Fall Risk  06/06/2020 05/28/2020 05/26/2019 05/25/2019  Falls in the past year? 0 0 0 0  Number falls in past yr: 0 0 0 0  Injury with Fall? 0 0 - 0  Follow up Falls prevention discussed Falls evaluation completed Falls evaluation completed;Education provided;Falls prevention discussed Falls prevention discussed    Any stairs in or around the home? Yes  If so, are there any without handrails? No  Home free of loose throw rugs in walkways, pet beds, electrical cords, etc? Yes  Adequate lighting in your home to reduce risk of falls? Yes   ASSISTIVE DEVICES UTILIZED TO PREVENT FALLS:  Life alert? No  Use of a cane, walker or w/c? No  Grab bars in the bathroom? No  Shower chair or bench in shower? No  Elevated toilet seat or a handicapped toilet? No   TIMED UP AND GO:  Was the test performed? No . Phone visit   Cognitive Function:No cognitive impairment noted MMSE - Mini Mental State Exam 05/25/2019  Orientation to time 5  Orientation to Place 5  Registration 3  Attention/ Calculation 5  Recall 3  Language- name 2 objects 2  Language- repeat 1  Language- follow 3 step command 3  Language- read & follow direction 1  Write a sentence 1  Copy design 1  Total score 30        Immunizations Immunization History  Administered Date(s) Administered  . PFIZER SARS-COV-2 Vaccination  09/16/2019, 10/12/2019, 04/23/2020    TDAP status: Up to date   Flu Vaccine status: Declined, Education has been provided regarding the importance of this vaccine  but patient still declined. Advised may receive this vaccine at local pharmacy or Health Dept. Aware to provide a copy of the vaccination record if obtained from local pharmacy or Health Dept. Verbalized acceptance and understanding.   Pneumococcal vaccine status: Declined,  Education has been provided regarding the importance of this vaccine but patient still declined. Advised may receive this vaccine at local pharmacy or Health Dept. Aware to provide a copy of the vaccination record if obtained from local pharmacy or Health Dept. Verbalized acceptance and understanding.    Covid-19 vaccine status: Completed vaccines  Qualifies for Shingles Vaccine? Yes   Zostavax completed No   Shingrix Completed?: No.    Education has been provided regarding the importance of this vaccine. Patient has been advised to call insurance company to determine out of pocket expense if they have not yet received this vaccine. Advised may also receive vaccine at local pharmacy or Health Dept. Verbalized acceptance and understanding.  Screening Tests Health Maintenance  Topic Date Due  . MAMMOGRAM  06/02/2021  . DEXA SCAN  06/02/2021  . TETANUS/TDAP  08/22/2023  . COLONOSCOPY  11/06/2027  . COVID-19 Vaccine  Completed  . Hepatitis C Screening  Completed  . INFLUENZA VACCINE  Discontinued  . PNA vac Low Risk Adult  Discontinued    Health Maintenance  There are no preventive care reminders to display for this patient.  Colorectal cancer screening: Completed Colonoscopy 11/05/2017. Repeat every 10 years   Mammogram status: Completed Bilateral 06/04/2020. Repeat every year   Bone Density status: Completed 06/03/2019. Results reflect: Bone density results: OSTEOPOROSIS. Repeat every 2 years.  Lung Cancer Screening: (Low Dose CT Chest recommended if  Age 98-80 years, 30 pack-year currently smoking OR have quit w/in 15years.) does not qualify.    Additional Screening:  Hepatitis C Screening: Completed 05/26/2019  Vision Screening: Recommended annual ophthalmology exams for early detection of glaucoma and other disorders of the eye. Is the patient up to date with their annual eye exam?  No  Who is the provider or what is the name of the office in which the patient attends annual eye exams? Unsure of name Patient aware of the need for eye exam and is in the process of finding a new eye doctor.   Dental Screening: Recommended annual dental exams for proper oral hygiene  Community Resource Referral / Chronic Care Management: CRR required this visit?  No   CCM required this visit?  No      Plan:     I have personally reviewed and noted the following in the patient's chart:   . Medical and social history . Use of alcohol, tobacco or illicit drugs  . Current medications and supplements . Functional ability and status . Nutritional status . Physical activity . Advanced directives . List of other physicians . Hospitalizations, surgeries, and ER visits in previous 12 months . Vitals . Screenings to include cognitive, depression, and falls . Referrals and appointments  In addition, I have reviewed and discussed with patient certain preventive protocols, quality metrics, and best practice recommendations. A written personalized care plan for preventive services as well as general preventive health recommendations were provided to patient.   Due to this being a telephonic visit, the after visit summary with patients personalized plan was offered to patient via mail or my-chart. Patient would like to access on my-chart.   Roanna RaiderMartha A Romesha Scherer, LPN   [id number]/10/[id number]/17/2021  Nurse Health Advisor  Nurse Notes: None

## 2020-06-06 ENCOUNTER — Ambulatory Visit (INDEPENDENT_AMBULATORY_CARE_PROVIDER_SITE_OTHER): Payer: Medicare PPO

## 2020-06-06 VITALS — Ht 62.0 in | Wt 149.0 lb

## 2020-06-06 DIAGNOSIS — Z0000 Encounter for general adult medical examination without abnormal findings: Secondary | ICD-10-CM | POA: Diagnosis not present

## 2020-06-06 NOTE — Patient Instructions (Signed)
Alexandria Rush , Thank you for taking time to complete your Medicare Wellness Visit. I appreciate your ongoing commitment to your health goals. Please review the following plan we discussed and let me know if I can assist you in the future.   Screening recommendations/referrals: Colonoscopy: Completed 11/05/2017- Due- 11/06/2027 Mammogram: Completed 06/04/2020- Due 06/04/2021 Bone Density: Completed 06/03/2019-Due-  06/02/2021 Recommended yearly ophthalmology/optometry visit for glaucoma screening and checkup Recommended yearly dental visit for hygiene and checkup  Vaccinations: Influenza vaccine: Allergic to vaccines Pneumococcal vaccine: Allergic to vaccines Tdap vaccine: Allergic to vaccines Shingles vaccine: Allergic to vaccines  Covid-19:Completed vaccines  Advanced directives: Copy in chart  Conditions/risks identified: See problem list  Next appointment: Follow up in one year for your annual wellness visit 06/12/2021 @ 9:00am   Preventive Care 65 Years and Older, Female Preventive care refers to lifestyle choices and visits with your health care provider that can promote health and wellness. What does preventive care include?  A yearly physical exam. This is also called an annual well check.  Dental exams once or twice a year.  Routine eye exams. Ask your health care provider how often you should have your eyes checked.  Personal lifestyle choices, including:  Daily care of your teeth and gums.  Regular physical activity.  Eating a healthy diet.  Avoiding tobacco and drug use.  Limiting alcohol use.  Practicing safe sex.  Taking low-dose aspirin every day.  Taking vitamin and mineral supplements as recommended by your health care provider. What happens during an annual well check? The services and screenings done by your health care provider during your annual well check will depend on your age, overall health, lifestyle risk factors, and family history of  disease. Counseling  Your health care provider may ask you questions about your:  Alcohol use.  Tobacco use.  Drug use.  Emotional well-being.  Home and relationship well-being.  Sexual activity.  Eating habits.  History of falls.  Memory and ability to understand (cognition).  Work and work Astronomer.  Reproductive health. Screening  You may have the following tests or measurements:  Height, weight, and BMI.  Blood pressure.  Lipid and cholesterol levels. These may be checked every 5 years, or more frequently if you are over 70 years old.  Skin check.  Lung cancer screening. You may have this screening every year starting at age 67 if you have a 30-pack-year history of smoking and currently smoke or have quit within the past 15 years.  Fecal occult blood test (FOBT) of the stool. You may have this test every year starting at age 31.  Flexible sigmoidoscopy or colonoscopy. You may have a sigmoidoscopy every 5 years or a colonoscopy every 10 years starting at age 25.  Hepatitis C blood test.  Hepatitis B blood test.  Sexually transmitted disease (STD) testing.  Diabetes screening. This is done by checking your blood sugar (glucose) after you have not eaten for a while (fasting). You may have this done every 1-3 years.  Bone density scan. This is done to screen for osteoporosis. You may have this done starting at age 54.  Mammogram. This may be done every 1-2 years. Talk to your health care provider about how often you should have regular mammograms. Talk with your health care provider about your test results, treatment options, and if necessary, the need for more tests. Vaccines  Your health care provider may recommend certain vaccines, such as:  Influenza vaccine. This is recommended every year.  Tetanus,  diphtheria, and acellular pertussis (Tdap, Td) vaccine. You may need a Td booster every 10 years.  Zoster vaccine. You may need this after age  79.  Pneumococcal 13-valent conjugate (PCV13) vaccine. One dose is recommended after age 73.  Pneumococcal polysaccharide (PPSV23) vaccine. One dose is recommended after age 10. Talk to your health care provider about which screenings and vaccines you need and how often you need them. This information is not intended to replace advice given to you by your health care provider. Make sure you discuss any questions you have with your health care provider. Document Released: 08/03/2015 Document Revised: 03/26/2016 Document Reviewed: 05/08/2015 Elsevier Interactive Patient Education  2017 [place] Prevention in the Home Falls can cause injuries. They can happen to people of all ages. There are many things you can do to make your home safe and to help prevent falls. What can I do on the outside of my home?  Regularly fix the edges of walkways and driveways and fix any cracks.  Remove anything that might make you trip as you walk through a door, such as a raised step or threshold.  Trim any bushes or trees on the path to your home.  Use bright outdoor lighting.  Clear any walking paths of anything that might make someone trip, such as rocks or tools.  Regularly check to see if handrails are loose or broken. Make sure that both sides of any steps have handrails.  Any raised decks and porches should have guardrails on the edges.  Have any leaves, snow, or ice cleared regularly.  Use sand or salt on walking paths during winter.  Clean up any spills in your garage right away. This includes oil or grease spills. What can I do in the bathroom?  Use night lights.  Install grab bars by the toilet and in the tub and shower. Do not use towel bars as grab bars.  Use non-skid mats or decals in the tub or shower.  If you need to sit down in the shower, use a plastic, non-slip stool.  Keep the floor dry. Clean up any water that spills on the floor as soon as it happens.  Remove  soap buildup in the tub or shower regularly.  Attach bath mats securely with double-sided non-slip rug tape.  Do not have throw rugs and other things on the floor that can make you trip. What can I do in the bedroom?  Use night lights.  Make sure that you have a light by your bed that is easy to reach.  Do not use any sheets or blankets that are too big for your bed. They should not hang down onto the floor.  Have a firm chair that has side arms. You can use this for support while you get dressed.  Do not have throw rugs and other things on the floor that can make you trip. What can I do in the kitchen?  Clean up any spills right away.  Avoid walking on wet floors.  Keep items that you use a lot in easy-to-reach places.  If you need to reach something above you, use a strong step stool that has a grab bar.  Keep electrical cords out of the way.  Do not use floor polish or wax that makes floors slippery. If you must use wax, use non-skid floor wax.  Do not have throw rugs and other things on the floor that can make you trip. What can I do with  my stairs?  Do not leave any items on the stairs.  Make sure that there are handrails on both sides of the stairs and use them. Fix handrails that are broken or loose. Make sure that handrails are as long as the stairways.  Check any carpeting to make sure that it is firmly attached to the stairs. Fix any carpet that is loose or worn.  Avoid having throw rugs at the top or bottom of the stairs. If you do have throw rugs, attach them to the floor with carpet tape.  Make sure that you have a light switch at the top of the stairs and the bottom of the stairs. If you do not have them, ask someone to add them for you. What else can I do to help prevent falls?  Wear shoes that:  Do not have high heels.  Have rubber bottoms.  Are comfortable and fit you well.  Are closed at the toe. Do not wear sandals.  If you use a  stepladder:  Make sure that it is fully opened. Do not climb a closed stepladder.  Make sure that both sides of the stepladder are locked into place.  Ask someone to hold it for you, if possible.  Clearly mark and make sure that you can see:  Any grab bars or handrails.  First and last steps.  Where the edge of each step is.  Use tools that help you move around (mobility aids) if they are needed. These include:  Canes.  Walkers.  Scooters.  Crutches.  Turn on the lights when you go into a dark area. Replace any light bulbs as soon as they burn out.  Set up your furniture so you have a clear path. Avoid moving your furniture around.  If any of your floors are uneven, fix them.  If there are any pets around you, be aware of where they are.  Review your medicines with your doctor. Some medicines can make you feel dizzy. This can increase your chance of falling. Ask your doctor what other things that you can do to help prevent falls. This information is not intended to replace advice given to you by your health care provider. Make sure you discuss any questions you have with your health care provider. Document Released: 05/03/2009 Document Revised: 12/13/2015 Document Reviewed: 08/11/2014 Elsevier Interactive Patient Education  2017 [employer].

## 2020-06-19 ENCOUNTER — Other Ambulatory Visit: Payer: Self-pay

## 2020-06-19 ENCOUNTER — Ambulatory Visit (INDEPENDENT_AMBULATORY_CARE_PROVIDER_SITE_OTHER): Payer: Medicare PPO

## 2020-06-19 DIAGNOSIS — R944 Abnormal results of kidney function studies: Secondary | ICD-10-CM | POA: Diagnosis not present

## 2020-06-20 LAB — BASIC METABOLIC PANEL
BUN: 20 mg/dL (ref 7–25)
CO2: 27 mmol/L (ref 20–32)
Calcium: 9.3 mg/dL (ref 8.6–10.4)
Chloride: 105 mmol/L (ref 98–110)
Creat: 0.89 mg/dL (ref 0.50–0.99)
Glucose, Bld: 87 mg/dL (ref 65–99)
Potassium: 4.9 mmol/L (ref 3.5–5.3)
Sodium: 142 mmol/L (ref 135–146)

## 2020-08-01 ENCOUNTER — Telehealth: Payer: Self-pay

## 2020-08-01 NOTE — Telephone Encounter (Signed)
Patient calling about bill $315.00.    Please call patient today, if possible. [phone]  Thank you!

## 2020-08-03 ENCOUNTER — Encounter: Payer: Self-pay | Admitting: Family Medicine

## 2020-08-03 NOTE — Telephone Encounter (Signed)
I have sent an email to [employer] inquiring as to why patient's [place] Rush plan did not cover the $295 cost of her MWV on 06/06/2020 with the nurse.  I am awaiting their response.  The other $20 balance is from a co-pay from her 05/28/20 visit with her PCP where she must have discussed a chronic condition and that was billed out to her.

## 2020-08-03 NOTE — Telephone Encounter (Signed)
Patient calling again about bill. Very upset. Says she has called three times now and was supposed to get call back yesterday. States she will call every day until this is resolved.

## 2020-08-08 NOTE — Telephone Encounter (Signed)
Billing issue

## 2020-10-30 ENCOUNTER — Ambulatory Visit: Payer: Medicare PPO | Admitting: Family Medicine

## 2020-10-30 DIAGNOSIS — R748 Abnormal levels of other serum enzymes: Secondary | ICD-10-CM

## 2020-10-30 DIAGNOSIS — I10 Essential (primary) hypertension: Secondary | ICD-10-CM

## 2020-10-30 DIAGNOSIS — M810 Age-related osteoporosis without current pathological fracture: Secondary | ICD-10-CM

## 2020-10-31 ENCOUNTER — Other Ambulatory Visit: Payer: Self-pay

## 2020-10-31 ENCOUNTER — Encounter: Payer: Self-pay | Admitting: Family Medicine

## 2020-10-31 ENCOUNTER — Ambulatory Visit: Payer: Medicare PPO | Admitting: Family Medicine

## 2020-10-31 VITALS — BP 122/68 | HR 67 | Temp 98.4°F | Ht 65.0 in | Wt 150.0 lb

## 2020-10-31 DIAGNOSIS — I10 Essential (primary) hypertension: Secondary | ICD-10-CM | POA: Diagnosis not present

## 2020-10-31 DIAGNOSIS — E663 Overweight: Secondary | ICD-10-CM

## 2020-10-31 DIAGNOSIS — M810 Age-related osteoporosis without current pathological fracture: Secondary | ICD-10-CM | POA: Diagnosis not present

## 2020-10-31 MED ORDER — AMLODIPINE BESYLATE 5 MG PO TABS: 5.0000 mg | ORAL_TABLET | Freq: Every day | ORAL | 1 refills | Status: DC

## 2020-10-31 MED ORDER — ALENDRONATE SODIUM 70 MG PO TABS: 70.0000 mg | ORAL_TABLET | ORAL | 11 refills | Status: DC

## 2020-10-31 NOTE — Progress Notes (Signed)
This visit occurred during the SARS-CoV-2 public health emergency.  Safety protocols were in place, including screening questions prior to the visit, additional usage of staff PPE, and extensive cleaning of exam room while observing appropriate contact time as indicated for disinfecting solutions.    Patient ID: Alexandria Rush, female  DOB: 10-05-1950, 70 y.o.   MRN: [id number] Patient Care Team    Relationship Specialty Notifications Start End  Natalia Leatherwood, DO PCP - General Family Medicine  08/20/18   Nyoka Cowden, MD Consulting Physician Pulmonary Disease  08/20/18   Janyth Contes, MD    08/20/18    Comment: thoracic surgeon  Vida Rigger, MD Consulting Physician Gastroenterology  08/20/18     Chief Complaint  Patient presents with  . Follow-up    [facility]mc; pt is not fasting     Subjective:  Alexandria Rush is a 70 y.o.  Female  present for  Hypertension:  Pt reports compliance  with amlodipine 5 mg daily. Patient denies chest pain, shortness of breath, dizziness or lower extremity edema. Pt does not take a daily baby ASA. Pt is is not prescribed statin. Diet: Low-sodium Exercise: Routine exercise.   Osteoporosis: DEXA with osteoporosis.  Patient also had elevated alkaline phosphatase on labs in the past- since normal.  She has started the vitamin D and calcium, as well as Fosamax for her osteoporosis.  She is tolerating the Fosamax.  She has no nausea, vomiting, abdominal pain. She is supplementing vitamin D.   Depression screen [facility] 2/9 06/06/2020 05/28/2020 05/26/2019 05/25/2019 08/20/2018  Decreased Interest 0 0 0 0 0  Down, Depressed, Hopeless 0 0 0 0 0  PHQ - 2 Score 0 0 0 0 0   No flowsheet data found.   Immunization History  Administered Date(s) Administered  . PFIZER(Purple Top)SARS-COV-2 Vaccination 09/16/2019, 10/12/2019, 04/23/2020   Past Medical History:  Diagnosis Date  . Anemia   . Asthma   . Chicken pox   . GERD (gastroesophageal reflux disease)   .  Hypertension   . MVP (mitral valve prolapse)   . Rib fracture 08/27/2017   left 6-9 rib fx- seen of f/u CT- xray was normal   Allergies  Allergen Reactions  . Erythromycin Anaphylaxis  . Influenza Vaccines Anaphylaxis  . Penicillins Anaphylaxis  . Phenylephrine Anaphylaxis  . Tetracycline Anaphylaxis  . Tropicamide Swelling  . Benzalkonium Chloride Rash  . Fosamax [Alendronate] Nausea Only    Worsened GERD and stomach upset.   . Lactase     Other reaction(s): Wheezing Other reaction(s): Wheezing   . Lactose Intolerance (Gi) Swelling    wheezing  . Peach [Prunus Persica] Swelling  . Pneumococcal Vaccine Other (See Comments)    Pt doesn't take this vaccine as a precaution due to other allergens she has. Other reaction(s): Other (See Comments) Pt doesn't take this vaccine as a precaution due to other allergens she has.   . Bee Venom Rash  . Lac Bovis Nausea And Vomiting  . Neomycin-Bacitracin Zn-Polymyx Rash   Past Surgical History:  Procedure Laterality Date  . ADENOIDECTOMY    . APPENDECTOMY    . COLONOSCOPY  2019  . HYSTEROSCOPY    . NASAL SINUS SURGERY  2014  . RIB FRACTURE SURGERY  2019  . TONSILLECTOMY     Family History  Problem Relation Age of Onset  . Stomach cancer Mother   . Cancer Mother   . Hypertension Father   . Heart disease Father   .  Stroke Father   . Diabetes Brother   . Allergies Brother   . Asthma Brother   . Allergic rhinitis Brother   . Diabetes Paternal Aunt   . Hypertension Brother   . Allergies Sister   . Asthma Sister   . Diabetes Sister   . Allergic rhinitis Sister   . Allergies Son   . Allergies Daughter   . Cancer Maternal Aunt   . Stroke Maternal Uncle   . Cancer Paternal Aunt        breast  . Breast cancer Paternal Aunt   . Cancer Maternal Uncle        pancreatic   Social History   Social History Narrative   Marital status/children/pets: married, 2 children. From [facility]   Education/employment: masters degree. Retired-  Sr. Conservator, museum/gallery:      -smoke alarm in the home:Yes     - wears seatbelt: Yes     - Feels safe in their relationships: Yes    Allergies as of 10/31/2020      Reactions   Erythromycin Anaphylaxis   Influenza Vaccines Anaphylaxis   Penicillins Anaphylaxis   Phenylephrine Anaphylaxis   Tetracycline Anaphylaxis   Tropicamide Swelling   Benzalkonium Chloride Rash   Fosamax [alendronate] Nausea Only   Worsened GERD and stomach upset.    Lactase    Other reaction(s): Wheezing Other reaction(s): Wheezing   Lactose Intolerance (gi) Swelling   wheezing   Peach [prunus Persica] Swelling   Pneumococcal Vaccine Other (See Comments)   Pt doesn't take this vaccine as a precaution due to other allergens she has. Other reaction(s): Other (See Comments) Pt doesn't take this vaccine as a precaution due to other allergens she has.   Bee Venom Rash   Lac Bovis Nausea And Vomiting   Neomycin-bacitracin Zn-polymyx Rash      Medication List       Accurate as of October 31, 2020  2:24 PM. If you have any questions, ask your nurse or doctor.        STOP taking these medications   Pfizer-BioNTech COVID-19 Vacc 30 MCG/0.3ML injection Generic drug: COVID-19 mRNA vaccine Proofreader) Stopped by: Felix Pacini, DO     TAKE these medications   albuterol 108 (90 Base) MCG/ACT inhaler Commonly known as: Ventolin HFA Inhale 2 puffs into the lungs every 6 (six) hours as needed for wheezing or shortness of breath.   alendronate 70 MG tablet Commonly known as: FOSAMAX Take 1 tablet (70 mg total) by mouth once a week.   amLODipine 5 MG tablet Commonly known as: NORVASC Take 1 tablet (5 mg total) by mouth daily.   CALCIUM CARBONATE-VITAMIN D3 PO Take by mouth.   EPINEPHrine 0.3 mg/0.3 mL Soaj injection Commonly known as: EpiPen 2-Pak Inject 0.3 mLs (0.3 mg total) into the muscle as needed for anaphylaxis.   famotidine 20 MG tablet Commonly known as: Pepcid One at bedtime   KP Fish  Oil 1200 MG Caps Take by mouth.       All past medical history, surgical history, allergies, family history, immunizations andmedications were updated in the EMR today and reviewed under the history and medication portions of their EMR.     No results found for this or any previous visit (from the past 2160 hour(s)).  No results found.   ROS: 14 pt review of systems performed and negative (unless mentioned in an HPI)  Objective: BP 122/68   Pulse 67   Temp 98.4 F (36.9  C) (Oral)   Ht 5\' 5"  (1.651 m)   Wt 150 lb (68 kg)   LMP 07/21/2008 (Approximate)   SpO2 98%   BMI 24.96 kg/m  Gen: Afebrile. No acute distress. Nontoxic. Pleasant female.  HENT: AT. [place]. MMM Eyes:Pupils Equal Round Reactive to light, Extraocular movements intact,  Conjunctiva without redness, discharge or icterus. Neck/lymp/endocrine: Supple,no lymphadenopathy, no thyromegaly CV: RRR no murmur, no edema, +2/4 P posterior tibialis pulses Chest: CTAB, no wheeze or crackles Abd: Soft. NTND. BS present. no Masses palpated.  Neuro:  Normal gait. PERLA. EOMi. Alert. Oriented x3 Psych: Normal affect, dress and demeanor. Normal speech. Normal thought content and judgment.   No exam data present  Assessment/plan: Alexandria Rush is a 70 y.o. female present for  Benign essential HTN - stable. - continue amlodipine to 5 mg QD.  -Routine exercise and low-sodium diet discussed. If needs additional script to get her to her cpe ok call to call in.  -Follow-up 5.5 mos (11/8- CPE)  Osteoporosis without current pathological fracture, unspecified osteoporosis type Continue  Fosamax  -Repeat DEXA in 05/2021 - Vit d 1000u and ca 1200 mg by diet or supplement recommended.  - continue exercise programs.  Pt elected to have vit d collected next visit.   Return in about 7 months (around 05/30/2021) for CPE (30 min), [facility]MC (30 min).  No orders of the defined types were placed in this encounter.   Meds ordered this  encounter  Medications  . amLODipine (NORVASC) 5 MG tablet    Sig: Take 1 tablet (5 mg total) by mouth daily.    Dispense:  90 tablet    Refill:  1  . alendronate (FOSAMAX) 70 MG tablet    Sig: Take 1 tablet (70 mg total) by mouth once a week.    Dispense:  4 tablet    Refill:  11   Referral Orders  No referral(s) requested today    Electronically signed by: 13/04/2021, DO  [facility] [place]

## 2020-10-31 NOTE — Patient Instructions (Signed)
    Your blood pressure looks great.   I have refilled your medication.   Next appt in Novemeber for your physical.

## 2020-11-23 ENCOUNTER — Telehealth: Payer: Self-pay | Admitting: Family Medicine

## 2020-11-23 NOTE — Telephone Encounter (Signed)
There would need to be a medical reason for a physician to "prescribe" a COVID test, otherwise it would be considered an act of fraud for me to provide a prescription of medical necessity, when there is not a medical necessity-but it is required for her travel.  There are travel clinics that we will complete the COVID testing for patients traveling.  Sorry. I know these are confusing times.

## 2020-11-23 NOTE — Telephone Encounter (Signed)
Per Selena Batten we can only do COVID test in office for pt who experiencing sx. Please advise if script is an option

## 2020-11-23 NOTE — Telephone Encounter (Signed)
Patient states that she is going out of the country and will require a COVID test. She spoke with her insurance company and they told her if her provider writes her a prescription the test would be free (she said when you go out of the country you have to pay for the test). She will need this as soon as possible. Please give her a call at [phone] if you have any questions.

## 2020-11-23 NOTE — Telephone Encounter (Signed)
Pt informed and verified understanding 

## 2020-12-31 DIAGNOSIS — Z0100 Encounter for examination of eyes and vision without abnormal findings: Secondary | ICD-10-CM | POA: Diagnosis not present

## 2021-01-25 ENCOUNTER — Telehealth: Payer: Self-pay

## 2021-01-25 NOTE — Telephone Encounter (Signed)
Pt called wanting PCP to call her back. Pt states that her insurance wants to talk with you about their rates. Advised pt to have insurance company fax something to [place] if they wanted someone to look at any information. Pt states that PCP has always done it for her. Again advised pt to have insurance fax information to be reviewed. Pt wanted PCP to call because she didn't understand why we or PCP would not call the insurance company.

## 2021-01-25 NOTE — Telephone Encounter (Signed)
This is not something a physician would need to do.  I am not certain even what she is speaking of. Can you please call patient and clarify request?  Thank you.

## 2021-01-28 NOTE — Telephone Encounter (Signed)
Since I do not own my own practice, this decision is not mine to make. I am not certain who would need to approve this.  Suggestions?

## 2021-02-07 ENCOUNTER — Encounter (INDEPENDENT_AMBULATORY_CARE_PROVIDER_SITE_OTHER): Payer: Self-pay

## 2021-02-07 ENCOUNTER — Other Ambulatory Visit: Payer: Self-pay

## 2021-02-07 ENCOUNTER — Ambulatory Visit (INDEPENDENT_AMBULATORY_CARE_PROVIDER_SITE_OTHER): Payer: Self-pay

## 2021-02-21 ENCOUNTER — Encounter (INDEPENDENT_AMBULATORY_CARE_PROVIDER_SITE_OTHER): Payer: Self-pay | Admitting: Family

## 2021-02-21 ENCOUNTER — Other Ambulatory Visit: Payer: Self-pay

## 2021-02-21 ENCOUNTER — Ambulatory Visit (INDEPENDENT_AMBULATORY_CARE_PROVIDER_SITE_OTHER): Payer: Self-pay | Admitting: Family

## 2021-02-21 VITALS — BP 126/78 | HR 70 | Temp 97.2°F | Resp 16 | Ht 63.0 in | Wt 149.9 lb

## 2021-02-21 DIAGNOSIS — I10 Essential (primary) hypertension: Secondary | ICD-10-CM

## 2021-02-21 DIAGNOSIS — Z0000 Encounter for general adult medical examination without abnormal findings: Secondary | ICD-10-CM

## 2021-02-21 DIAGNOSIS — Z7729 Contact with and (suspected ) exposure to other hazardous substances: Secondary | ICD-10-CM

## 2021-02-21 NOTE — Progress Notes (Unsigned)
[facility], [facility]  [address] [place] [address]  Phone: [phone]  Fax: [phone]      Patient name: Anita Vasquez  MRN:  [id number]   DOB: [birth year]   DATE:  02/21/2021     Subjective:     Patient ID: Anita Vasquez is a 70 y.o. female    Chief Complaint:  Chief Complaint   Patient presents with   . Physical     Dupont          HPI - Patient of Ashok Norris here for Dupont Exam. [place] and has lived there for 25 years.    Blood tests show: unremarkable. Urine shows (not completed). Hemoccult was not done.    We discussed that CT scans will only be given to those felt to be at risk for a malignancy related to exposures, and that a CT scan itself has medical risks associated with it.     Review of Systems:  Constitutional: negative for fevers, chills, sweats, fatigue, rash, and weight loss  Respiratory: negative for cough, sputum, dyspnea on exertion  Cardiovascular: negative for chest pain, chest pressure/discomfort,  palpitations, orthopnea, paroxysmal nocturnal dyspnea and lower extremity edema  Gastrointestinal: negative for dysphagia, reflux symptoms, nausea, vomiting, melena, diarrhea, Constipation, dark urine, jaundice,  and abdominal pain  Genitourinary:negative for frequency, dysuria, nocturia, hematuria  Hematologic/lymphatic: negative for easy bruising and bleeding  Musculoskeletal:negative for myalgias, arthralgias, back pain and muscle weakness  Neurological: negative for headaches, dizziness, seizures, speech problems, head injury    Past Medical History:   Diagnosis Date   . Asthma    . Broken ribs    . Hypertension    . Known health problems: none             Past Surgical History:   Procedure Laterality Date   . HX APPENDECTOMY     . HX SEPTOPLASTY     . HX TONSILLECTOMY     . RIB FRACTURE SURGERY Left    . SINUS SURGERY     . SINUS SURGERY             Family Medical History:     Problem Relation (Age of Onset)    Cancer Mother    Other Father             Social History     Socioeconomic History   . Marital status: Married     Spouse name: Not on file   . Number of children: Not on file   . Years of education: Not on file   . Highest education level: Not on file   Occupational History   . Not on file   Tobacco Use   . Smoking status: Never Smoker   . Smokeless tobacco: Never Used   Vaping Use   . Vaping Use: Never used   Substance and Sexual Activity   . Alcohol use: Yes   . Drug use: Never   . Sexual activity: Yes     Partners: Male   Other Topics Concern   . Ability to Walk 1 Flight of Steps without SOB/CP Yes   . Routine Exercise Not Asked   . Ability to Walk 2 Flight of Steps without SOB/CP Not Asked   . Unable to Ambulate Not Asked   . Total Care Not Asked   . Ability To Do Own ADL's Yes   . Uses Walker Not Asked   . Other Activity Level Not  Asked   . Uses Cane Not Asked   Social History Narrative   . Not on file     Social Determinants of Health     Financial Resource Strain: Not on file   Food Insecurity: Not on file   Transportation Needs: Not on file   Physical Activity: Not on file   Stress: Not on file   Intimate Partner Violence: Not on file   Housing Stability: Not on file         Review of Systems      Objective:     BP 126/78   Pulse 70   Temp 36.2 C (97.2 F) (Thermal Scan)   Resp 16   Ht 1.6 m (5\' 3" )   Wt 68 kg (149 lb 14.4 oz)   SpO2 97%   BMI 26.55 kg/m           General appearance  alert, cooperative, no distress, appears stated age   Head  Normocephalic, without obvious abnormality, atraumatic   Eyes  conjunctivae/corneas clear. PERRL   Ears  normal TM's and external ear canals AU   Nose Nares normal. Septum midline. Mucosa normal. No drainage or sinus tenderness.   Throat Lips, mucosa, and tongue normal.   Neck supple, symmetrical, trachea midline, no adenopathy, thyroid: not enlarged, symmetric, no tenderness/mass/nodules, no carotid bruit and no JVD   Back   symmetric, no curvature. ROM normal. No CVA tenderness   Lungs    clear to auscultation bilaterally   Heart  regular rate and rhythm, S1, S2 normal, no murmur, click, rub or gallop   Abdomen   soft, non-tender. Bowel sounds normal. No masses,  No organomegaly   Pelvic Deferred   Extremities extremities normal, atraumatic, no cyanosis or edema.    Pulses 2+ and symmetric   Skin Skin color, texture, turgor normal. No rashes or lesions   Lymph nodes Cervical, supraclavicular, and axillary nodes normal.   Neurologic Normal         Physical Exam     Assessment & Plan:       ICD-10-CM    1. Routine physical examination  Z00.00    2. Primary hypertension  I10        After reviewing the patient's labs and history, and performing a complete physical exam, I do not recommend that the patient undergo a CT scan at this time.   The patient was given the opportunity to ask questions and those questions were answered to the patient's satisfaction. The patient was encouraged to call with any additional questions or concerns.        , FNP-BC

## 2021-02-25 NOTE — Progress Notes (Signed)
Z77.29  - dupont /perrine medical monitoring  ,possible exposure to hazardous materials in [place] , [place]

## 2021-02-25 NOTE — Addendum Note (Signed)
Addended by: Olena Leatherwood on: 02/25/2021 12:22 PM     Modules accepted: Level of Service

## 2021-03-01 ENCOUNTER — Telehealth (INDEPENDENT_AMBULATORY_CARE_PROVIDER_SITE_OTHER): Payer: Self-pay | Admitting: Family

## 2021-03-01 NOTE — Telephone Encounter (Signed)
patient was seen for dupont physical but UA and fit card was not back. results are returned and scanned into media. please review and let us know as patient called and is asking for results    Rayetta Pigg, [place]  03/01/2021, 15:31

## 2021-03-04 NOTE — Telephone Encounter (Signed)
Stool is negative for blood. She did have blood on urine cytology. No carcinoma cells. I would recommend she sees urology for this. Is she willing to go?

## 2021-03-05 ENCOUNTER — Encounter: Payer: Self-pay | Admitting: Family Medicine

## 2021-03-05 NOTE — Telephone Encounter (Signed)
spoke to patient   states [place] out [place] state, patient states she will talk to her PCP and see a urologist there    Anita Vasquez, [place]  03/05/2021, 08:20

## 2021-03-12 DIAGNOSIS — Z7983 Long term (current) use of bisphosphonates: Secondary | ICD-10-CM | POA: Diagnosis not present

## 2021-03-12 DIAGNOSIS — Z823 Family history of stroke: Secondary | ICD-10-CM | POA: Diagnosis not present

## 2021-03-12 DIAGNOSIS — M810 Age-related osteoporosis without current pathological fracture: Secondary | ICD-10-CM | POA: Diagnosis not present

## 2021-03-12 DIAGNOSIS — E663 Overweight: Secondary | ICD-10-CM | POA: Diagnosis not present

## 2021-03-12 DIAGNOSIS — J45909 Unspecified asthma, uncomplicated: Secondary | ICD-10-CM | POA: Diagnosis not present

## 2021-03-12 DIAGNOSIS — I10 Essential (primary) hypertension: Secondary | ICD-10-CM | POA: Diagnosis not present

## 2021-03-12 DIAGNOSIS — Z809 Family history of malignant neoplasm, unspecified: Secondary | ICD-10-CM | POA: Diagnosis not present

## 2021-03-12 DIAGNOSIS — I959 Hypotension, unspecified: Secondary | ICD-10-CM | POA: Diagnosis not present

## 2021-03-12 DIAGNOSIS — Z6826 Body mass index (BMI) 26.0-26.9, adult: Secondary | ICD-10-CM | POA: Diagnosis not present

## 2021-03-18 ENCOUNTER — Ambulatory Visit: Payer: Medicare PPO | Admitting: Family Medicine

## 2021-03-18 ENCOUNTER — Other Ambulatory Visit: Payer: Self-pay

## 2021-03-18 ENCOUNTER — Encounter: Payer: Self-pay | Admitting: Family Medicine

## 2021-03-18 VITALS — BP 109/74 | HR 65 | Temp 98.2°F | Wt 151.0 lb

## 2021-03-18 DIAGNOSIS — R3129 Other microscopic hematuria: Secondary | ICD-10-CM | POA: Diagnosis not present

## 2021-03-18 DIAGNOSIS — B356 Tinea cruris: Secondary | ICD-10-CM | POA: Diagnosis not present

## 2021-03-18 LAB — POCT URINALYSIS DIPSTICK
Bilirubin, UA: 1
Blood, UA: NEGATIVE
Glucose, UA: NEGATIVE
Ketones, UA: NEGATIVE
Nitrite, UA: NEGATIVE
Protein, UA: NEGATIVE
Spec Grav, UA: 1.025 (ref 1.010–1.025)
Urobilinogen, UA: 0.2 E.U./dL
pH, UA: 6 (ref 5.0–8.0)

## 2021-03-18 MED ORDER — CLOTRIMAZOLE 1 % EX CREA: 1.0000 "application " | TOPICAL_CREAM | Freq: Two times a day (BID) | CUTANEOUS | 5 refills | Status: DC

## 2021-03-18 NOTE — Patient Instructions (Signed)
Cream for legs every 12 hours .  We will call you with lab results.

## 2021-03-18 NOTE — Progress Notes (Signed)
This visit occurred during the SARS-CoV-2 public health emergency.  Safety protocols were in place, including screening questions prior to the visit, additional usage of staff PPE, and extensive cleaning of exam room while observing appropriate contact time as indicated for disinfecting solutions.    Alexandria Rush , [birth year], 70 y.o., female MRN: [id number] Patient Care Team    Relationship Specialty Notifications Start End  Natalia Leatherwood, DO PCP - General Family Medicine  08/20/18   Nyoka Cowden, MD Consulting Physician Pulmonary Disease  08/20/18   Janyth Contes, MD    08/20/18    Comment: thoracic surgeon  Vida Rigger, MD Consulting Physician Gastroenterology  08/20/18     Chief Complaint  Patient presents with   Hematuria    Pt was seen and was told that had hematuria but no sx beside vaginal itching     Subjective: Pt presents for an OV with complaints of painless hematuria on screening exam.  Patient has a screening exam every 2 years for an exposure she has had in the past.  Patient was contacted and told she had elevated RBCs in her urine.  She denies any dysuria, urinary frequency, fevers, chills or low back pain.  She states she has been having a rash that is red and itchy on the inside of her thighs bilaterally.  Depression screen [facility] 2/9 06/06/2020 05/28/2020 05/26/2019 05/25/2019 08/20/2018  Decreased Interest 0 0 0 0 0  Down, Depressed, Hopeless 0 0 0 0 0  PHQ - 2 Score 0 0 0 0 0    Allergies  Allergen Reactions   Erythromycin Anaphylaxis   Influenza Vaccines Anaphylaxis   Penicillins Anaphylaxis   Phenylephrine Anaphylaxis   Tetracycline Anaphylaxis   Tropicamide Swelling   Benzalkonium Chloride Rash   Fosamax [Alendronate] Nausea Only    Worsened GERD and stomach upset.    Lactase     Other reaction(s): Wheezing Other reaction(s): Wheezing    Lactose Intolerance (Gi) Swelling    wheezing   Peach [Prunus Persica] Swelling   Pneumococcal Vaccine  Other (See Comments)    Pt doesn't take this vaccine as a precaution due to other allergens she has. Other reaction(s): Other (See Comments) Pt doesn't take this vaccine as a precaution due to other allergens she has.    Bee Venom Rash   Lac Bovis Nausea And Vomiting   Neomycin-Bacitracin Zn-Polymyx Rash   Social History   Social History Narrative   Marital status/children/pets: married, 2 children. From [facility]   Education/employment: masters degree. Retired- Sr. Conservator, museum/gallery:      -smoke alarm in the home:Yes     - wears seatbelt: Yes     - Feels safe in their relationships: Yes   Past Medical History:  Diagnosis Date   Anemia    Asthma    Chicken pox    GERD (gastroesophageal reflux disease)    Hypertension    MVP (mitral valve prolapse)    Rib fracture 08/27/2017   left 6-9 rib fx- seen of f/u CT- xray was normal   Past Surgical History:  Procedure Laterality Date   ADENOIDECTOMY     APPENDECTOMY     COLONOSCOPY  2019   HYSTEROSCOPY     NASAL SINUS SURGERY  2014   RIB FRACTURE SURGERY  2019   TONSILLECTOMY     Family History  Problem Relation Age of Onset   Stomach cancer Mother    Cancer Mother  Hypertension Father    Heart disease Father    Stroke Father    Diabetes Brother    Allergies Brother    Asthma Brother    Allergic rhinitis Brother    Diabetes Paternal Aunt    Hypertension Brother    Allergies Sister    Asthma Sister    Diabetes Sister    Allergic rhinitis Sister    Allergies Son    Allergies Daughter    Cancer Maternal Aunt    Stroke Maternal Uncle    Cancer Paternal Aunt        breast   Breast cancer Paternal Aunt    Cancer Maternal Uncle        pancreatic   Allergies as of 03/18/2021       Reactions   Erythromycin Anaphylaxis   Influenza Vaccines Anaphylaxis   Penicillins Anaphylaxis   Phenylephrine Anaphylaxis   Tetracycline Anaphylaxis   Tropicamide Swelling   Benzalkonium Chloride Rash   Fosamax [alendronate]  Nausea Only   Worsened GERD and stomach upset.    Lactase    Other reaction(s): Wheezing Other reaction(s): Wheezing   Lactose Intolerance (gi) Swelling   wheezing   Peach [prunus Persica] Swelling   Pneumococcal Vaccine Other (See Comments)   Pt doesn't take this vaccine as a precaution due to other allergens she has. Other reaction(s): Other (See Comments) Pt doesn't take this vaccine as a precaution due to other allergens she has.   Bee Venom Rash   Lac Bovis Nausea And Vomiting   Neomycin-bacitracin Zn-polymyx Rash        Medication List        Accurate as of March 18, 2021  3:15 PM. If you have any questions, ask your nurse or doctor.          albuterol 108 (90 Base) MCG/ACT inhaler Commonly known as: Ventolin HFA Inhale 2 puffs into the lungs every 6 (six) hours as needed for wheezing or shortness of breath.   alendronate 70 MG tablet Commonly known as: FOSAMAX Take 1 tablet (70 mg total) by mouth once a week.   amLODipine 5 MG tablet Commonly known as: NORVASC Take 1 tablet (5 mg total) by mouth daily.   CALCIUM CARBONATE-VITAMIN D3 PO Take by mouth.   EPINEPHrine 0.3 mg/0.3 mL Soaj injection Commonly known as: EpiPen 2-Pak Inject 0.3 mLs (0.3 mg total) into the muscle as needed for anaphylaxis.   famotidine 20 MG tablet Commonly known as: Pepcid One at bedtime   KP Fish Oil 1200 MG Caps Take by mouth.        All past medical history, surgical history, allergies, family history, immunizations andmedications were updated in the EMR today and reviewed under the history and medication portions of their EMR.     ROS: Negative, with the exception of above mentioned in HPI   Objective:  BP 109/74   Pulse 65   Temp 98.2 F (36.8 C) (Oral)   Wt 151 lb (68.5 kg)   LMP 07/21/2008 (Approximate)   SpO2 98%   BMI 25.13 kg/m  Body mass index is 25.13 kg/m. Gen: Afebrile. No acute distress. Nontoxic in appearance, well developed, well nourished.   HENT: AT. [place].  Eyes:Pupils Equal Round Reactive to light, Extraocular movements intact,  Conjunctiva without redness, discharge or icterus. Abd: Soft. NTND. BS present MSK: no cvA tenderness.  Skin: Mild erythema bilateral inner thigh Neuro: Normal gait. PERLA. EOMi. Alert. Oriented x3  Psych: Normal affect, dress and demeanor. Normal speech.  Normal thought content and judgment.  No results found. No results found. No results found for this or any previous visit (from the past 24 hour(s)).  Assessment/Plan: AMEY HOSSAIN is a 70 y.o. female present for OV for  hematuria Results we have from urine collection did not show RBCs in the urine but did show WBC.  Urinalysis today also shows WBC present.  Will send for microscopic with reflex culture and treat if appropriate. - POCT Urinalysis Dipstick - Urinalysis w microscopic + reflex cultur  Tinea cruris Clotrimazole cream twice daily Discussed using new towel and washcloth each day.  Reviewed expectations re: course of current medical issues. Discussed self-management of symptoms. Outlined signs and symptoms indicating need for more acute intervention. Patient verbalized understanding and all questions were answered. Patient received an After-Visit Summary.    Orders Placed This Encounter  Procedures   Urinalysis w microscopic + reflex cultur   POCT Urinalysis Dipstick   No orders of the defined types were placed in this encounter.  Referral Orders  No referral(s) requested today     Note is dictated utilizing voice recognition software. Although note has been proof read prior to signing, occasional typographical errors still can be missed. If any questions arise, please do not hesitate to call for verification.   electronically signed by:  Felix Pacini, DO  [facility] [place]

## 2021-03-22 ENCOUNTER — Telehealth: Payer: Self-pay | Admitting: Family Medicine

## 2021-03-22 LAB — URINALYSIS W MICROSCOPIC + REFLEX CULTURE
Bacteria, UA: NONE SEEN /HPF
Bilirubin Urine: NEGATIVE
Glucose, UA: NEGATIVE
Hgb urine dipstick: NEGATIVE
Hyaline Cast: NONE SEEN /LPF
Ketones, ur: NEGATIVE
Nitrites, Initial: NEGATIVE
Protein, ur: NEGATIVE
RBC / HPF: NONE SEEN /HPF (ref 0–2)
Specific Gravity, Urine: 1.012 (ref 1.001–1.035)
Squamous Epithelial / HPF: NONE SEEN /HPF (ref <=5)
WBC, UA: NONE SEEN /HPF (ref 0–5)
pH: 6 (ref 5.0–8.0)

## 2021-03-22 LAB — URINE CULTURE
MICRO NUMBER:: 12308315
SPECIMEN QUALITY:: ADEQUATE

## 2021-03-22 LAB — CULTURE INDICATED

## 2021-03-22 MED ORDER — NITROFURANTOIN MONOHYD MACRO 100 MG PO CAPS: 100.0000 mg | ORAL_CAPSULE | Freq: Two times a day (BID) | ORAL | 0 refills | Status: DC

## 2021-03-22 NOTE — Telephone Encounter (Signed)
Spoke with patient regarding results/recommendations,voiced understanding.  

## 2021-03-22 NOTE — Telephone Encounter (Signed)
Please inform pt: Her urine cx was positive for UTI. I have called in macrobid abx, BID x 7 days to treat.

## 2021-04-03 ENCOUNTER — Telehealth: Payer: Self-pay

## 2021-04-03 NOTE — Telephone Encounter (Signed)
Left vmail to make patient aware that labs will need to be drawn at [facility].

## 2021-05-28 ENCOUNTER — Ambulatory Visit (INDEPENDENT_AMBULATORY_CARE_PROVIDER_SITE_OTHER): Payer: Medicare PPO | Admitting: Family Medicine

## 2021-05-28 ENCOUNTER — Encounter: Payer: Self-pay | Admitting: Family Medicine

## 2021-05-28 ENCOUNTER — Other Ambulatory Visit: Payer: Self-pay

## 2021-05-28 ENCOUNTER — Encounter: Payer: Medicare PPO | Admitting: Family Medicine

## 2021-05-28 VITALS — BP 114/75 | HR 72 | Temp 98.0°F | Ht 62.0 in | Wt 146.0 lb

## 2021-05-28 DIAGNOSIS — Z0000 Encounter for general adult medical examination without abnormal findings: Secondary | ICD-10-CM

## 2021-05-28 DIAGNOSIS — J4520 Mild intermittent asthma, uncomplicated: Secondary | ICD-10-CM | POA: Diagnosis not present

## 2021-05-28 DIAGNOSIS — M810 Age-related osteoporosis without current pathological fracture: Secondary | ICD-10-CM | POA: Diagnosis not present

## 2021-05-28 DIAGNOSIS — Z131 Encounter for screening for diabetes mellitus: Secondary | ICD-10-CM | POA: Diagnosis not present

## 2021-05-28 DIAGNOSIS — I10 Essential (primary) hypertension: Secondary | ICD-10-CM | POA: Diagnosis not present

## 2021-05-28 DIAGNOSIS — E663 Overweight: Secondary | ICD-10-CM

## 2021-05-28 DIAGNOSIS — Z1231 Encounter for screening mammogram for malignant neoplasm of breast: Secondary | ICD-10-CM | POA: Diagnosis not present

## 2021-05-28 LAB — CBC
HCT: 37.4 % (ref 36.0–46.0)
Hemoglobin: 12.4 g/dL (ref 12.0–15.0)
MCHC: 33.2 g/dL (ref 30.0–36.0)
MCV: 97.6 fl (ref 78.0–100.0)
Platelets: 248 10*3/uL (ref 150.0–400.0)
RBC: 3.83 Mil/uL — ABNORMAL LOW (ref 3.87–5.11)
RDW: 13.8 % (ref 11.5–15.5)
WBC: 5.5 10*3/uL (ref 4.0–10.5)

## 2021-05-28 LAB — COMPREHENSIVE METABOLIC PANEL
ALT: 9 U/L (ref 0–35)
AST: 13 U/L (ref 0–37)
Albumin: 4.3 g/dL (ref 3.5–5.2)
Alkaline Phosphatase: 63 U/L (ref 39–117)
BUN: 17 mg/dL (ref 6–23)
CO2: 30 mEq/L (ref 19–32)
Calcium: 9.1 mg/dL (ref 8.4–10.5)
Chloride: 106 mEq/L (ref 96–112)
Creatinine, Ser: 0.77 mg/dL (ref 0.40–1.20)
GFR: 78.84 mL/min (ref >60.00)
Glucose, Bld: 84 mg/dL (ref 70–99)
Potassium: 4.7 mEq/L (ref 3.5–5.1)
Sodium: 143 mEq/L (ref 135–145)
Total Bilirubin: 0.5 mg/dL (ref 0.2–1.2)
Total Protein: 6.7 g/dL (ref 6.0–8.3)

## 2021-05-28 LAB — LIPID PANEL
Cholesterol: 206 mg/dL — ABNORMAL HIGH (ref 0–200)
HDL: 47 mg/dL (ref >39.00)
LDL Cholesterol: 139 mg/dL — ABNORMAL HIGH (ref 0–99)
NonHDL: 158.66
Total CHOL/HDL Ratio: 4
Triglycerides: 97 mg/dL (ref 0.0–149.0)
VLDL: 19.4 mg/dL (ref 0.0–40.0)

## 2021-05-28 LAB — HEMOGLOBIN A1C: Hgb A1c MFr Bld: 6 % (ref 4.6–6.5)

## 2021-05-28 LAB — VITAMIN D 25 HYDROXY (VIT D DEFICIENCY, FRACTURES): VITD: 41.39 ng/mL (ref 30.00–100.00)

## 2021-05-28 LAB — TSH: TSH: 2.64 u[IU]/mL (ref 0.35–5.50)

## 2021-05-28 MED ORDER — ALBUTEROL SULFATE HFA 108 (90 BASE) MCG/ACT IN AERS
2.0000 | INHALATION_SPRAY | Freq: Four times a day (QID) | RESPIRATORY_TRACT | 11 refills | Status: DC | PRN
Start: 2021-05-28 — End: 2022-05-07

## 2021-05-28 MED ORDER — EPINEPHRINE 0.3 MG/0.3ML IJ SOAJ: 0.3000 mg | INTRAMUSCULAR | 0 refills | Status: DC | PRN

## 2021-05-28 MED ORDER — ALENDRONATE SODIUM 70 MG PO TABS: 70.0000 mg | ORAL_TABLET | ORAL | 11 refills | Status: DC

## 2021-05-28 MED ORDER — AMLODIPINE BESYLATE 5 MG PO TABS: 5.0000 mg | ORAL_TABLET | Freq: Every day | ORAL | 1 refills | Status: DC

## 2021-05-28 NOTE — Patient Instructions (Signed)
°Great to see you today.  °I have refilled the medication(s) we provide.  ° °If labs were collected, we will inform you of lab results once received either by echart message or telephone call.  ° - echart message- for normal results that have been seen by the patient already.  ° - telephone call: abnormal results or if patient has not viewed results in their echart. °Health Maintenance After Age 69 °After age 69, you are at a higher risk for certain long-term diseases and infections as well as injuries from falls. Falls are a major cause of broken bones and head injuries in people who are older than age 69. Getting regular preventive care can help to keep you healthy and well. Preventive care includes getting regular testing and making lifestyle changes as recommended by your health care provider. Talk with your health care provider about: °Which screenings and tests you should have. A screening is a test that checks for a disease when you have no symptoms. °A diet and exercise plan that is right for you. °What should I know about screenings and tests to prevent falls? °Screening and testing are the best ways to find a health problem early. Early diagnosis and treatment give you the best chance of managing medical conditions that are common after age 69. Certain conditions and lifestyle choices may make you more likely to have a fall. Your health care provider may recommend: °Regular vision checks. Poor vision and conditions such as cataracts can make you more likely to have a fall. If you wear glasses, make sure to get your prescription updated if your vision changes. °Medicine review. Work with your health care provider to regularly review all of the medicines you are taking, including over-the-counter medicines. Ask your health care provider about any side effects that may make you more likely to have a fall. Tell your health care provider if any medicines that you take make you feel dizzy or sleepy. °Strength  and balance checks. Your health care provider may recommend certain tests to check your strength and balance while standing, walking, or changing positions. °Foot health exam. Foot pain and numbness, as well as not wearing proper footwear, can make you more likely to have a fall. °Screenings, including: °Osteoporosis screening. Osteoporosis is a condition that causes the bones to get weaker and break more easily. °Blood pressure screening. Blood pressure changes and medicines to control blood pressure can make you feel dizzy. °Depression screening. You may be more likely to have a fall if you have a fear of falling, feel depressed, or feel unable to do activities that you used to do. °Alcohol use screening. Using too much alcohol can affect your balance and may make you more likely to have a fall. °Follow these instructions at home: °Lifestyle °Do not drink alcohol if: °Your health care provider tells you not to drink. °If you drink alcohol: °Limit how much you have to: °0-1 drink a day for women. °0-2 drinks a day for men. °Know how much alcohol is in your drink. In the [place] one drink equals one 12 oz bottle of beer (355 mL), one 5 oz glass of wine (148 mL), or one 1½ oz glass of hard liquor (44 mL). °Do not use any products that contain nicotine or tobacco. These products include cigarettes, chewing tobacco, and vaping devices, such as e-cigarettes. If you need help quitting, ask your health care provider. °Activity ° °Follow a regular exercise program to stay fit. This will help you maintain   your balance. Ask your health care provider what types of exercise are appropriate for you. °If you need a cane or walker, use it as recommended by your health care provider. °Wear supportive shoes that have nonskid soles. °Safety ° °Remove any tripping hazards, such as rugs, cords, and clutter. °Install safety equipment such as grab bars in bathrooms and safety rails on stairs. °Keep rooms and walkways well-lit. °General  instructions °Talk with your health care provider about your risks for falling. Tell your health care provider if: °You fall. Be sure to tell your health care provider about all falls, even ones that seem minor. °You feel dizzy, tiredness (fatigue), or off-balance. °Take over-the-counter and prescription medicines only as told by your health care provider. These include supplements. °Eat a healthy diet and maintain a healthy weight. A healthy diet includes low-fat dairy products, low-fat (lean) meats, and fiber from whole grains, beans, and lots of fruits and vegetables. °Stay current with your vaccines. °Schedule regular health, dental, and eye exams. °Summary °Having a healthy lifestyle and getting preventive care can help to protect your health and wellness after age 69. °Screening and testing are the best way to find a health problem early and help you avoid having a fall. Early diagnosis and treatment give you the best chance for managing medical conditions that are more common for people who are older than age 69. °Falls are a major cause of broken bones and head injuries in people who are older than age 69. Take precautions to prevent a fall at home. °Work with your health care provider to learn what changes you can make to improve your health and wellness and to prevent falls. °This information is not intended to replace advice given to you by your health care provider. Make sure you discuss any questions you have with your health care provider. °Document Revised: 11/26/2020 Document Reviewed: 11/26/2020 °Elsevier Patient Education © 2022 [employer] ° °

## 2021-05-28 NOTE — Progress Notes (Signed)
This visit occurred during the SARS-CoV-2 public health emergency.  Safety protocols were in place, including screening questions prior to the visit, additional usage of staff PPE, and extensive cleaning of exam room while observing appropriate contact time as indicated for disinfecting solutions.    Patient ID: Alexandria Rush, female  DOB: May 12, 1951, 70 y.o.   MRN: [id number] Patient Care Team    Relationship Specialty Notifications Start End  Natalia Leatherwood, DO PCP - General Family Medicine  08/20/18   Nyoka Cowden, MD Consulting Physician Pulmonary Disease  08/20/18   Janyth Contes, MD    08/20/18    Comment: thoracic surgeon  Vida Rigger, MD Consulting Physician Gastroenterology  08/20/18     Chief Complaint  Patient presents with   Annual Exam    Pt is fasting    Subjective:  Alexandria Rush is a 70 y.o.  Female  present for CPE/cmc . All past medical history, surgical history, allergies, family history, immunizations, medications and social history were updated in the electronic medical record today. All recent labs, ED visits and hospitalizations within the last year were reviewed.  Health maintenance:   Assistive device: none Oxygen JAS:NKNL Patient has a Dental home. Hospitalizations/ED visits: reviewed   Hypertension:  Pt reports compliance  with amlodipine 5 mg daily. Patient denies chest pain, shortness of breath, dizziness or lower extremity edema.  Pt does not take a daily baby ASA. Pt is is not prescribed statin. Diet: Low-sodium Exercise: Routine exercise.    Osteoporosis: DEXA with osteoporosis.  Patient also had elevated alkaline phosphatase on labs.  She has started the vitamin D and calcium, as well as Fosamax for her osteoporosis.  She is tolerating the Fosamax.  She has no nausea, vomiting, abdominal pain.   Depression screen [facility] 2/9 05/28/2021 06/06/2020 05/28/2020 05/26/2019 05/25/2019  Decreased Interest 0 0 0 0 0  Down, Depressed, Hopeless 0 0 0 0  0  PHQ - 2 Score 0 0 0 0 0   No flowsheet data found.   Immunization History  Administered Date(s) Administered   PFIZER(Purple Top)SARS-COV-2 Vaccination 09/16/2019, 10/12/2019, 04/23/2020   Past Medical History:  Diagnosis Date   Anemia    Asthma    Chicken pox    GERD (gastroesophageal reflux disease)    Hypertension    MVP (mitral valve prolapse)    Rib fracture 08/27/2017   left 6-9 rib fx- seen of f/u CT- xray was normal   Allergies  Allergen Reactions   Erythromycin Anaphylaxis   Influenza Vaccines Anaphylaxis   Penicillins Anaphylaxis   Phenylephrine Anaphylaxis   Tetracycline Anaphylaxis   Tropicamide Swelling   Benzalkonium Chloride Rash   Fosamax [Alendronate] Nausea Only    Worsened GERD and stomach upset.    Tilactase     Other reaction(s): Wheezing Other reaction(s): Wheezing    Lactose Intolerance (Gi) Swelling    wheezing   Peach [Prunus Persica] Swelling   Pneumococcal Vaccine Other (See Comments)    Pt doesn't take this vaccine as a precaution due to other allergens she has. Other reaction(s): Other (See Comments) Pt doesn't take this vaccine as a precaution due to other allergens she has.    Bee Venom Rash   Lac Bovis Nausea And Vomiting   Neomycin-Bacitracin Zn-Polymyx Rash   Past Surgical History:  Procedure Laterality Date   ADENOIDECTOMY     APPENDECTOMY     COLONOSCOPY  2019   HYSTEROSCOPY     NASAL SINUS SURGERY  2014   RIB FRACTURE SURGERY  2019   TONSILLECTOMY     Family History  Problem Relation Age of Onset   Stomach cancer Mother    Cancer Mother    Hypertension Father    Heart disease Father    Stroke Father    Diabetes Brother    Allergies Brother    Asthma Brother    Allergic rhinitis Brother    Diabetes Paternal Aunt    Hypertension Brother    Allergies Sister    Asthma Sister    Diabetes Sister    Allergic rhinitis Sister    Allergies Son    Allergies Daughter    Cancer Maternal Aunt    Stroke Maternal  Uncle    Cancer Paternal Aunt        breast   Breast cancer Paternal Aunt    Cancer Maternal Uncle        pancreatic   Social History   Social History Narrative   Marital status/children/pets: married, 2 children. From [facility]   Education/employment: masters degree. Retired- Sr. Conservator, museum/gallery:      -smoke alarm in the home:Yes     - wears seatbelt: Yes     - Feels safe in their relationships: Yes    Allergies as of 05/28/2021       Reactions   Erythromycin Anaphylaxis   Influenza Vaccines Anaphylaxis   Penicillins Anaphylaxis   Phenylephrine Anaphylaxis   Tetracycline Anaphylaxis   Tropicamide Swelling   Benzalkonium Chloride Rash   Fosamax [alendronate] Nausea Only   Worsened GERD and stomach upset.    Tilactase    Other reaction(s): Wheezing Other reaction(s): Wheezing   Lactose Intolerance (gi) Swelling   wheezing   Peach [prunus Persica] Swelling   Pneumococcal Vaccine Other (See Comments)   Pt doesn't take this vaccine as a precaution due to other allergens she has. Other reaction(s): Other (See Comments) Pt doesn't take this vaccine as a precaution due to other allergens she has.   Bee Venom Rash   Lac Bovis Nausea And Vomiting   Neomycin-bacitracin Zn-polymyx Rash        Medication List        Accurate as of May 28, 2021 10:18 AM. If you have any questions, ask your nurse or doctor.          STOP taking these medications    nitrofurantoin (macrocrystal-monohydrate) 100 MG capsule Commonly known as: Macrobid Stopped by: Felix Pacini, DO       TAKE these medications    albuterol 108 (90 Base) MCG/ACT inhaler Commonly known as: Ventolin HFA Inhale 2 puffs into the lungs every 6 (six) hours as needed for wheezing or shortness of breath.   alendronate 70 MG tablet Commonly known as: FOSAMAX Take 1 tablet (70 mg total) by mouth once a week.   amLODipine 5 MG tablet Commonly known as: NORVASC Take 1 tablet (5 mg total) by mouth  daily.   CALCIUM CARBONATE-VITAMIN D3 PO Take by mouth.   clotrimazole 1 % cream Commonly known as: LOTRIMIN Apply 1 application topically 2 (two) times daily.   EPINEPHrine 0.3 mg/0.3 mL Soaj injection Commonly known as: EpiPen 2-Pak Inject 0.3 mg into the muscle as needed for anaphylaxis.   famotidine 20 MG tablet Commonly known as: Pepcid One at bedtime   KP Fish Oil 1200 MG Caps Take by mouth.        All past medical history, surgical history, allergies, family history, immunizations andmedications  were updated in the EMR today and reviewed under the history and medication portions of their EMR.      MM 3D SCREEN BREAST BILATERAL  Result Date: 06/06/2020 CLINICAL DATA:  Screening. EXAM: DIGITAL SCREENING BILATERAL MAMMOGRAM WITH TOMO AND CAD COMPARISON:  Previous exam(s). ACR Breast Density Category c: The breast tissue is heterogeneously dense, which may obscure small masses. FINDINGS: There are no findings suspicious for malignancy. Images were processed with CAD. IMPRESSION: No mammographic evidence of malignancy. A result letter of this screening mammogram will be mailed directly to the patient. RECOMMENDATION: Screening mammogram in one year. (Code:SM-B-01Y) BI-RADS CATEGORY  1: Negative. Electronically Signed   By: Harmon Pier M.D.   On: 06/06/2020 13:55     ROS: 14 pt review of systems performed and negative (unless mentioned in an HPI)  Objective: BP 114/75   Pulse 72   Temp 98 F (36.7 C) (Oral)   Ht 5\' 2"  (1.575 m)   Wt 146 lb (66.2 kg)   LMP 07/21/2008 (Approximate)   SpO2 99%   BMI 26.70 kg/m  Gen: Afebrile. No acute distress. Nontoxic in appearance, well-developed, well-nourished,  very pleasant female.  HENT: AT. [place]. Bilateral TM visualized and normal in appearance, normal external auditory canal. MMM, no oral lesions, adequate dentition. Bilateral nares within normal limits. Throat without erythema, ulcerations or exudates. no Cough on exam, no  hoarseness on exam. Eyes:Pupils Equal Round Reactive to light, Extraocular movements intact,  Conjunctiva without redness, discharge or icterus. Neck/lymp/endocrine: Supple,no lymphadenopathy, no thyromegaly CV: RRR no murmur, no edema, +2/4 P posterior tibialis pulses.  Chest: CTAB, no wheeze, rhonchi or crackles. normal Respiratory effort. good Air movement. Abd: Soft. flat. NTND. BS present. no Masses palpated. No hepatosplenomegaly. No rebound tenderness or guarding. Skin: no rashes, purpura or petechiae. Warm and well-perfused. Skin intact. Neuro/Msk: Normal gait. PERLA. EOMi. Alert. Oriented x3.  Cranial nerves II through XII intact. Muscle strength 5/5 upper/lower extremity. DTRs equal bilaterally. Psych: Normal affect, dress and demeanor. Normal speech. Normal thought content and judgment.   No results found.  Assessment/plan: ZONYA GUDGER is a 70 y.o. female present for CPE/cmc Mild intermittent extrinsic asthma without status asthmaticus without complication/allergies Refill albuterol and epi pen for her.   Benign essential HTN/overweight Stable.  Continue amlodipine Low sodium heart health diet and exercise.  - CBC - Comprehensive metabolic panel - Lipid panel - TSH F/u 5.5 months.   Osteoporosis without current pathological fracture, unspecified osteoporosis type - VITAMIN D 25 Hydroxy (Vit-D Deficiency, Fractures) - DG Bone Density; Future Diabetes mellitus screening - Hemoglobin A1c Breast cancer screening by mammogram - MM 3D SCREEN BREAST BILATERAL; Future Routine general medical examination at a health care facility Colonoscopy: completed 2019, by [facility], f/u - 5-10 years Mammogram: completed:05/2020, >> [facility] Cervical cancer screening: N/A. Pt would like referral to GYN>> referral placed last year Immunizations: tdap UTD 2015, Influenza declined(encouraged yearly), PNA series declined shingrix allergy- declined covid series completed. Covid completed.   Infectious disease screening: Hep C completed.  DEXA: 06/03/2019- > ordered today > [facility] Patient was encouraged to exercise greater than 150 minutes a week. Patient was encouraged to choose a diet filled with fresh fruits and vegetables, and lean meats. AVS provided to patient today for education/recommendation on gender specific health and safety maintenance.  Return in about 5 months (around 11/11/2021) for CMC (30 min).   Orders Placed This Encounter  Procedures   MM 3D SCREEN BREAST BILATERAL   DG Bone Density  CBC   Comprehensive metabolic panel   Hemoglobin A1c   Lipid panel   TSH   VITAMIN D 25 Hydroxy (Vit-D Deficiency, Fractures)    Meds ordered this encounter  Medications   alendronate (FOSAMAX) 70 MG tablet    Sig: Take 1 tablet (70 mg total) by mouth once a week.    Dispense:  4 tablet    Refill:  11   amLODipine (NORVASC) 5 MG tablet    Sig: Take 1 tablet (5 mg total) by mouth daily.    Dispense:  90 tablet    Refill:  1   albuterol (VENTOLIN HFA) 108 (90 Base) MCG/ACT inhaler    Sig: Inhale 2 puffs into the lungs every 6 (six) hours as needed for wheezing or shortness of breath.    Dispense:  8 g    Refill:  11   EPINEPHrine (EPIPEN 2-PAK) 0.3 mg/0.3 mL IJ SOAJ injection    Sig: Inject 0.3 mg into the muscle as needed for anaphylaxis.    Dispense:  1 each    Refill:  0    Referral Orders  No referral(s) requested today     Electronically signed by: Felix Pacini, DO [facility] [place]

## 2021-05-30 ENCOUNTER — Other Ambulatory Visit (HOSPITAL_BASED_OUTPATIENT_CLINIC_OR_DEPARTMENT_OTHER): Payer: Self-pay | Admitting: Family Medicine

## 2021-05-30 DIAGNOSIS — M810 Age-related osteoporosis without current pathological fracture: Secondary | ICD-10-CM

## 2021-06-03 ENCOUNTER — Telehealth: Payer: Self-pay | Admitting: Family Medicine

## 2021-06-03 NOTE — Telephone Encounter (Signed)
Spoke with pt regarding labs and instructions.   

## 2021-06-03 NOTE — Telephone Encounter (Signed)
Patient inquired about recommendations on lowering her cholesterol. -Engaging in routine exercise greater than 30 minutes a day, of cardiovascular exercise-such as aerobics or brisk walking, will help decrease cholesterol. -Avoid butters, red meats and fatty meats (bacon, sausage, pepperoni etc.) -If using oil, use the least amount as possible and all of oil is best. -A diet higher in plant-based products, which are fruits and vegetables are best for cholesterol and naturally higher in fiber-which is also helpful for lowering cholesterol. -Lean meats are recommended when consuming meat product.  Should also be skinless.  I do not have a specific recommendations for "low inflammatory "diet.  That being said, a good nutritional website can be key when attempting to follow any specific diet.  There are many websites available on the Internet that we will guide patients in low inflammatory, low cholesterol etc. Diets.

## 2021-06-03 NOTE — Telephone Encounter (Signed)
-----   Message from Paschal Dopp, [place] sent at 05/29/2021 10:23 AM EST ----- Spoke with patient regarding results/recommendations. Pt wants to know what should be done for her cholesterol. I advised pt that no medication suggestions were given at this time and she may want to research diet changes. Pt wanted to know if there was specific diets needed and if there is one for cholesterol and inflammation.

## 2021-06-12 ENCOUNTER — Ambulatory Visit: Payer: Medicare PPO

## 2021-06-18 ENCOUNTER — Encounter: Payer: Self-pay | Admitting: Obstetrics and Gynecology

## 2021-06-18 ENCOUNTER — Other Ambulatory Visit: Payer: Self-pay

## 2021-06-18 ENCOUNTER — Ambulatory Visit: Payer: Medicare PPO | Admitting: Obstetrics and Gynecology

## 2021-06-18 VITALS — BP 120/60 | HR 74 | Ht 62.5 in | Wt 146.0 lb

## 2021-06-18 DIAGNOSIS — Z872 Personal history of diseases of the skin and subcutaneous tissue: Secondary | ICD-10-CM | POA: Diagnosis not present

## 2021-06-18 DIAGNOSIS — N907 Vulvar cyst: Secondary | ICD-10-CM | POA: Diagnosis not present

## 2021-06-18 NOTE — Progress Notes (Signed)
GYNECOLOGY  VISIT   HPI: 70 y.o.   Married White or Caucasian Not Hispanic or Latino  female   G2P2 with Patient's last menstrual period was 07/21/2008 (approximate).   here for patient states that she has bumps on her vulva  with a itch on and off. She is concerned about cancer.   She has had intermittent itching for years in bilateral groin/upper thigh. Sometimes itchy on the perineum or the vulva.  No vaginal d/c. She noticed some vulvar bumps last night.   GYNECOLOGIC HISTORY: Patient's last menstrual period was 07/21/2008 (approximate). Contraception:PMP  Menopausal hormone therapy: one         OB History     Gravida  2   Para  2   Term      Preterm      AB      Living  2      SAB      IAB      Ectopic      Multiple      Live Births                 Patient Active Problem List   Diagnosis Date Noted   Multiple drug allergies 02/28/2020   Adverse food reaction 02/28/2020   Elevated alkaline phosphatase level 06/27/2019   Osteoporosis 06/06/2019   Overweight (BMI 25.0-29.9) 05/26/2019   Other microscopic hematuria 09/22/2018   Contact with and (suspected) exposure to other hazardous metals 09/22/2018   Fibromyalgia 09/09/2018   GERD (gastroesophageal reflux disease) 08/20/2018   Asthma, extrinsic, without status asthmaticus 08/20/2018   Benign essential HTN 08/20/2018   Fothergill's neuralgia 08/20/2018   Peptic esophagitis 08/20/2018   Allergic rhinitis due to allergen 08/20/2018   Upper airway cough syndrome 12/02/2013   Nasal septal deviation 12/09/2012    Past Medical History:  Diagnosis Date   Anemia    Asthma    Chicken pox    GERD (gastroesophageal reflux disease)    Hypertension    MVP (mitral valve prolapse)    Rib fracture 08/27/2017   left 6-9 rib fx- seen of f/u CT- xray was normal    Past Surgical History:  Procedure Laterality Date   ADENOIDECTOMY     APPENDECTOMY     COLONOSCOPY  2019   HYSTEROSCOPY     NASAL SINUS  SURGERY  2014   RIB FRACTURE SURGERY  2019   TONSILLECTOMY      Current Outpatient Medications  Medication Sig Dispense Refill   albuterol (VENTOLIN HFA) 108 (90 Base) MCG/ACT inhaler Inhale 2 puffs into the lungs every 6 (six) hours as needed for wheezing or shortness of breath. 8 g 11   alendronate (FOSAMAX) 70 MG tablet Take 1 tablet (70 mg total) by mouth once a week. 4 tablet 11   amLODipine (NORVASC) 5 MG tablet Take 1 tablet (5 mg total) by mouth daily. 90 tablet 1   Calcium Carb-Cholecalciferol (CALCIUM CARBONATE-VITAMIN D3 PO) Take by mouth.     Cholecalciferol (VITAMIN D3 PO) Take by mouth.     clotrimazole (LOTRIMIN) 1 % cream Apply 1 application topically 2 (two) times daily. 30 g 5   EPINEPHrine (EPIPEN 2-PAK) 0.3 mg/0.3 mL IJ SOAJ injection Inject 0.3 mg into the muscle as needed for anaphylaxis. 1 each 0   famotidine (PEPCID) 20 MG tablet One at bedtime 30 tablet 2   Omega-3 Fatty Acids (KP FISH OIL) 1200 MG CAPS Take by mouth.     No current facility-administered medications  for this visit.     ALLERGIES: Erythromycin, Influenza vaccines, Penicillins, Phenylephrine, Tetracycline, Tropicamide, Benzalkonium chloride, Fosamax [alendronate], Tilactase, Lactose intolerance (gi), Peach [prunus persica], Pneumococcal vaccine, Bee venom, Lac bovis, and Neomycin-bacitracin zn-polymyx  Family History  Problem Relation Age of Onset   Stomach cancer Mother    Cancer Mother    Hypertension Father    Heart disease Father    Stroke Father    Diabetes Brother    Allergies Brother    Asthma Brother    Allergic rhinitis Brother    Diabetes Paternal Aunt    Hypertension Brother    Allergies Sister    Asthma Sister    Diabetes Sister    Allergic rhinitis Sister    Allergies Son    Allergies Daughter    Cancer Maternal Aunt    Stroke Maternal Uncle    Cancer Paternal Aunt        breast   Breast cancer Paternal Aunt    Cancer Maternal Uncle        pancreatic    Social  History   Socioeconomic History   Marital status: Married    Spouse name: Not on file   Number of children: Not on file   Years of education: Not on file   Highest education level: Not on file  Occupational History   Occupation: Herbalist: [employer]  Tobacco Use   Smoking status: Never   Smokeless tobacco: Never  Vaping Use   Vaping Use: Never used  Substance and Sexual Activity   Alcohol use: Yes    Alcohol/week: 0.0 standard drinks    Comment: socially/occ   Drug use: No   Sexual activity: Yes    Birth control/protection: Post-menopausal    Comment: 1st intercourse 70 yo-Fewer than 5 partners  Other Topics Concern   Not on file  Social History Narrative   Marital status/children/pets: married, 2 children. From [facility]   Education/employment: masters degree. Retired- Sr. Quarry manager:      -smoke alarm in the home:Yes     - wears seatbelt: Yes     - Feels safe in their relationships: Yes   Social Determinants of Radio broadcast assistant Strain: Not on file  Food Insecurity: Not on file  Transportation Needs: Not on file  Physical Activity: Not on file  Stress: Not on file  Social Connections: Not on file  Intimate Partner Violence: Not on file    Review of Systems  All other systems reviewed and are negative.  PHYSICAL EXAMINATION:    BP 120/60   Pulse 74   Ht 5' 2.5" (1.588 m)   Wt 146 lb (66.2 kg)   LMP 07/21/2008 (Approximate)   SpO2 100%   BMI 26.28 kg/m     General appearance: alert, cooperative and appears stated age  Skin of her groin and upper thigh (area where she reports issues) is normal, no erythema, no lesions.  Pelvic: External genitalia:  on the right labia majora are 3 separate epidermoid cysts, ranging in size from 3-6 mm. Not inflamed. No lesions, no erythema.               Urethra:  normal appearing urethra with no masses, tenderness or lesions              Bartholins and Skenes: normal                    Chaperone was  present for exam.  1. Vulvar cyst Patient reassured  No specific f/u is needed  2. History of contact dermatitis and eczema Discussed vulvar skin care. I suspect some of the irritation is from the elastic in her underwear. Recommended she change the type of underwear she is using. Use hypoallergenic soap and detergent Sleep without underwear at night Use Vaseline as needed.

## 2021-07-05 NOTE — Telephone Encounter (Signed)
Pt declines having AWV done

## 2021-07-08 ENCOUNTER — Ambulatory Visit (HOSPITAL_BASED_OUTPATIENT_CLINIC_OR_DEPARTMENT_OTHER)
Admission: RE | Admit: 2021-07-08 | Discharge: 2021-07-08 | Disposition: A | Discharge status: Home / Self Care | Payer: Medicare PPO | Source: Ambulatory Visit | Attending: Family Medicine | Admitting: Family Medicine

## 2021-07-08 ENCOUNTER — Encounter (HOSPITAL_BASED_OUTPATIENT_CLINIC_OR_DEPARTMENT_OTHER): Payer: Self-pay

## 2021-07-08 ENCOUNTER — Other Ambulatory Visit: Payer: Self-pay

## 2021-07-08 DIAGNOSIS — Z1231 Encounter for screening mammogram for malignant neoplasm of breast: Secondary | ICD-10-CM | POA: Diagnosis not present

## 2021-07-08 DIAGNOSIS — M810 Age-related osteoporosis without current pathological fracture: Secondary | ICD-10-CM | POA: Diagnosis not present

## 2021-07-08 DIAGNOSIS — M8589 Other specified disorders of bone density and structure, multiple sites: Secondary | ICD-10-CM | POA: Diagnosis not present

## 2021-08-27 NOTE — Progress Notes (Deleted)
GYNECOLOGY  VISIT   HPI: 71 y.o.   Married  European  female   G2P2 with Patient's last menstrual period was 07/21/2008 (approximate).   here for medication follow up.    GYNECOLOGIC HISTORY: Patient's last menstrual period was 07/21/2008 (approximate). Contraception:  PMP Menopausal hormone therapy:  *** Last mammogram:  *** Last pap smear:   ***        OB History     Gravida  2   Para  2   Term      Preterm      AB      Living  2      SAB      IAB      Ectopic      Multiple      Live Births                 Patient Active Problem List   Diagnosis Date Noted   Multiple drug allergies 02/28/2020   Adverse food reaction 02/28/2020   Elevated alkaline phosphatase level 06/27/2019   Osteoporosis 06/06/2019   Overweight (BMI 25.0-29.9) 05/26/2019   Other microscopic hematuria 09/22/2018   Contact with and (suspected) exposure to other hazardous metals 09/22/2018   Fibromyalgia 09/09/2018   GERD (gastroesophageal reflux disease) 08/20/2018   Asthma, extrinsic, without status asthmaticus 08/20/2018   Benign essential HTN 08/20/2018   Fothergill's neuralgia 08/20/2018   Peptic esophagitis 08/20/2018   Allergic rhinitis due to allergen 08/20/2018   Upper airway cough syndrome 12/02/2013   Nasal septal deviation 12/09/2012    Past Medical History:  Diagnosis Date   Anemia    Asthma    Chicken pox    GERD (gastroesophageal reflux disease)    Hypertension    MVP (mitral valve prolapse)    Rib fracture 08/27/2017   left 6-9 rib fx- seen of f/u CT- xray was normal    Past Surgical History:  Procedure Laterality Date   ADENOIDECTOMY     APPENDECTOMY     COLONOSCOPY  2019   HYSTEROSCOPY     NASAL SINUS SURGERY  2014   RIB FRACTURE SURGERY  2019   TONSILLECTOMY      Current Outpatient Medications  Medication Sig Dispense Refill   albuterol (VENTOLIN HFA) 108 (90 Base) MCG/ACT inhaler Inhale 2 puffs into the lungs every 6 (six) hours as needed  for wheezing or shortness of breath. 8 g 11   alendronate (FOSAMAX) 70 MG tablet Take 1 tablet (70 mg total) by mouth once a week. 4 tablet 11   amLODipine (NORVASC) 5 MG tablet Take 1 tablet (5 mg total) by mouth daily. 90 tablet 1   Calcium Carb-Cholecalciferol (CALCIUM CARBONATE-VITAMIN D3 PO) Take by mouth.     Cholecalciferol (VITAMIN D3 PO) Take by mouth.     clotrimazole (LOTRIMIN) 1 % cream Apply 1 application topically 2 (two) times daily. 30 g 5   EPINEPHrine (EPIPEN 2-PAK) 0.3 mg/0.3 mL IJ SOAJ injection Inject 0.3 mg into the muscle as needed for anaphylaxis. 1 each 0   famotidine (PEPCID) 20 MG tablet One at bedtime 30 tablet 2   Omega-3 Fatty Acids (KP FISH OIL) 1200 MG CAPS Take by mouth.     No current facility-administered medications for this visit.     ALLERGIES: Erythromycin, Influenza vaccines, Penicillins, Phenylephrine, Tetracycline, Tropicamide, Benzalkonium chloride, Fosamax [alendronate], Tilactase, Lactose intolerance (gi), Peach [prunus persica], Pneumococcal vaccine, Bee venom, Lac bovis, and Neomycin-bacitracin zn-polymyx  Family History  Problem Relation Age of  Onset   Stomach cancer Mother    Cancer Mother    Hypertension Father    Heart disease Father    Stroke Father    Diabetes Brother    Allergies Brother    Asthma Brother    Allergic rhinitis Brother    Diabetes Paternal Aunt    Hypertension Brother    Allergies Sister    Asthma Sister    Diabetes Sister    Allergic rhinitis Sister    Allergies Son    Allergies Daughter    Cancer Maternal Aunt    Stroke Maternal Uncle    Cancer Paternal Aunt        breast   Breast cancer Paternal Aunt    Cancer Maternal Uncle        pancreatic    Social History   Socioeconomic History   Marital status: Married    Spouse name: Not on file   Number of children: Not on file   Years of education: Not on file   Highest education level: Not on file  Occupational History   Occupation: Water quality scientist: [employer]  Tobacco Use   Smoking status: Never   Smokeless tobacco: Never  Vaping Use   Vaping Use: Never used  Substance and Sexual Activity   Alcohol use: Yes    Alcohol/week: 0.0 standard drinks    Comment: socially/occ   Drug use: No   Sexual activity: Yes    Birth control/protection: Post-menopausal    Comment: 1st intercourse 71 yo-Fewer than 5 partners  Other Topics Concern   Not on file  Social History Narrative   Marital status/children/pets: married, 2 children. From [facility]   Education/employment: masters degree. Retired- Sr. Quarry manager:      -smoke alarm in the home:Yes     - wears seatbelt: Yes     - Feels safe in their relationships: Yes   Social Determinants of Radio broadcast assistant Strain: Not on file  Food Insecurity: Not on file  Transportation Needs: Not on file  Physical Activity: Not on file  Stress: Not on file  Social Connections: Not on file  Intimate Partner Violence: Not on file    Review of Systems  PHYSICAL EXAMINATION:    LMP 07/21/2008 (Approximate)     General appearance: alert, cooperative and appears stated age Head: Normocephalic, without obvious abnormality, atraumatic Neck: no adenopathy, supple, symmetrical, trachea midline and thyroid normal to inspection and palpation Lungs: clear to auscultation bilaterally Breasts: normal appearance, no masses or tenderness, No nipple retraction or dimpling, No nipple discharge or bleeding, No axillary or supraclavicular adenopathy Heart: regular rate and rhythm Abdomen: soft, non-tender, no masses,  no organomegaly Extremities: extremities normal, atraumatic, no cyanosis or edema Skin: Skin color, texture, turgor normal. No rashes or lesions Lymph nodes: Cervical, supraclavicular, and axillary nodes normal. No abnormal inguinal nodes palpated Neurologic: Grossly normal  Pelvic: External genitalia:  no lesions              Urethra:  normal appearing urethra  with no masses, tenderness or lesions              Bartholins and Skenes: normal                 Vagina: normal appearing vagina with normal color and discharge, no lesions              Cervix: no lesions  Bimanual Exam:  Uterus:  normal size, contour, position, consistency, mobility, non-tender              Adnexa: no mass, fullness, tenderness              Rectal exam: {yes no:314532}.  Confirms.              Anus:  normal sphincter tone, no lesions  Chaperone was present for exam:  ***  ASSESSMENT     PLAN     An After Visit Summary was printed and given to the patient.  ______ minutes face to face time of which over 50% was spent in counseling.

## 2021-08-28 ENCOUNTER — Ambulatory Visit (INDEPENDENT_AMBULATORY_CARE_PROVIDER_SITE_OTHER): Payer: Medicare PPO | Admitting: Obstetrics and Gynecology

## 2021-08-28 ENCOUNTER — Encounter: Payer: Self-pay | Admitting: Obstetrics and Gynecology

## 2021-08-28 ENCOUNTER — Other Ambulatory Visit: Payer: Self-pay

## 2021-08-28 ENCOUNTER — Other Ambulatory Visit (HOSPITAL_COMMUNITY)
Admission: RE | Admit: 2021-08-28 | Discharge: 2021-08-28 | Disposition: A | Discharge status: Home / Self Care | Payer: Medicare PPO | Source: Ambulatory Visit | Attending: Obstetrics and Gynecology | Admitting: Obstetrics and Gynecology

## 2021-08-28 VITALS — BP 122/84 | HR 69 | Ht 62.25 in | Wt 151.0 lb

## 2021-08-28 DIAGNOSIS — Z124 Encounter for screening for malignant neoplasm of cervix: Secondary | ICD-10-CM

## 2021-08-28 DIAGNOSIS — N763 Subacute and chronic vulvitis: Secondary | ICD-10-CM

## 2021-08-28 DIAGNOSIS — Z01419 Encounter for gynecological examination (general) (routine) without abnormal findings: Secondary | ICD-10-CM

## 2021-08-28 DIAGNOSIS — Z1151 Encounter for screening for human papillomavirus (HPV): Secondary | ICD-10-CM | POA: Diagnosis not present

## 2021-08-28 MED ORDER — TRIAMCINOLONE ACETONIDE 0.025 % EX OINT: 1.0000 "application " | TOPICAL_OINTMENT | Freq: Two times a day (BID) | CUTANEOUS | 2 refills | Status: DC

## 2021-08-28 NOTE — Patient Instructions (Signed)

## 2021-08-28 NOTE — Progress Notes (Signed)
71 y.o. G2P2 Married Caucasian female here for annual breast and pelvic exam.    Complaining of chronic vulvar itching. Using Vaseline and cotton underwear.  Baby wipes are helping.   Dx with sebaceous cysts.   No vaginal discharge. No odor.    No vaginal bleeding.   No pad use.  No dryer sheets.  Using All or Tide laundry detergent.   Using Dove sensitive skin soap.  PCP:  Felix Pacini, DO   Patient's last menstrual period was 07/21/2008 (approximate).           Sexually active: Yes.    The current method of family planning is post menopausal status.    Exercising: Yes.     Walks 5 miles/day, silver sneakers Smoker:  no  Health Maintenance: Pap:  05-31-19 Neg, 2017 Neg:Neg HR HPV History of abnormal Pap:  no MMG:  07-08-21 [id number] Colonoscopy:  2019 normal;next 5-10 years BMD:   07-08-21  Result :Osteoporosis--improved TDaP:  Unsure--declines Gardasil:   no HIV: Unsure Hep C: 05-26-19 Neg Screening Labs:  PCP   reports that she has never smoked. She has never used smokeless tobacco. She reports that she does not currently use alcohol. She reports that she does not use drugs.  Past Medical History:  Diagnosis Date   Anemia    Asthma    Chicken pox    GERD (gastroesophageal reflux disease)    Hypertension    MVP (mitral valve prolapse)    Rib fracture 08/27/2017   left 6-9 rib fx- seen of f/u CT- xray was normal    Past Surgical History:  Procedure Laterality Date   ADENOIDECTOMY     APPENDECTOMY     COLONOSCOPY  2019   HYSTEROSCOPY     NASAL SINUS SURGERY  2014   RIB FRACTURE SURGERY  2019   TONSILLECTOMY      Current Outpatient Medications  Medication Sig Dispense Refill   albuterol (VENTOLIN HFA) 108 (90 Base) MCG/ACT inhaler Inhale 2 puffs into the lungs every 6 (six) hours as needed for wheezing or shortness of breath. 8 g 11   alendronate (FOSAMAX) 70 MG tablet Take 1 tablet (70 mg total) by mouth once a week. 4 tablet 11   amLODipine  (NORVASC) 5 MG tablet Take 1 tablet (5 mg total) by mouth daily. 90 tablet 1   Cholecalciferol (VITAMIN D3 PO) Take by mouth.     EPINEPHrine (EPIPEN 2-PAK) 0.3 mg/0.3 mL IJ SOAJ injection Inject 0.3 mg into the muscle as needed for anaphylaxis. 1 each 0   famotidine (PEPCID) 20 MG tablet One at bedtime 30 tablet 2   Omega-3 Fatty Acids (KP FISH OIL) 1200 MG CAPS Take by mouth.     Phytonadione (VITAMIN K1) POWD by Does not apply route.     No current facility-administered medications for this visit.    Family History  Problem Relation Age of Onset   Stomach cancer Mother    Cancer Mother    Hypertension Father    Heart disease Father    Stroke Father    Diabetes Brother    Allergies Brother    Asthma Brother    Allergic rhinitis Brother    Diabetes Paternal Aunt    Hypertension Brother    Allergies Sister    Asthma Sister    Diabetes Sister    Allergic rhinitis Sister    Allergies Son    Allergies Daughter    Cancer Maternal Aunt    Stroke Maternal Uncle  Cancer Paternal Aunt        breast   Breast cancer Paternal Aunt    Cancer Maternal Uncle        pancreatic    Review of Systems  All other systems reviewed and are negative.  Exam:   BP 122/84    Pulse 69    Ht 5' 2.25" (1.581 m)    Wt 151 lb (68.5 kg)    LMP 07/21/2008 (Approximate)    SpO2 98%    BMI 27.40 kg/m     General appearance: alert, cooperative and appears stated age Head: normocephalic, without obvious abnormality, atraumatic Neck: no adenopathy, supple, symmetrical, trachea midline and thyroid normal to inspection and palpation Lungs: clear to auscultation bilaterally Breasts: normal appearance, no masses or tenderness, No nipple retraction or dimpling, No nipple discharge or bleeding, No axillary adenopathy Heart: regular rate and rhythm Abdomen: soft, non-tender; no masses, no organomegaly Extremities: extremities normal, atraumatic, no cyanosis or edema Skin: skin color, texture, turgor normal.  No rashes or lesions Lymph nodes: cervical, supraclavicular, and axillary nodes normal. Neurologic: grossly normal  Pelvic: External genitalia:  scattered sebaceous cysts.  No skin rash or lesions.               No abnormal inguinal nodes palpated.              Urethra:  normal appearing urethra with no masses, tenderness or lesions              Bartholins and Skenes: normal                 Vagina: normal appearing vagina with normal color and discharge, no lesions              Cervix: no lesions              Pap taken: yes Bimanual Exam:  Uterus:  normal size, contour, position, consistency, mobility, non-tender              Adnexa: no mass, fullness, tenderness              Rectal exam: yes.  Confirms.              Anus:  normal sphincter tone, no lesions  Chaperone was present for exam:  Joy, CMA  Assessment:   Well woman visit with gynecologic exam. Chronic vulvitis. Vulvar epidermoid cysts.  Osteoporosis.  On Fosamax through PCP.   Plan: Mammogram screening discussed. Self breast awareness reviewed. Pap and HR HPV as above. Guidelines for Calcium, Vitamin D, regular exercise program including cardiovascular and weight bearing exercise. Triamcinolone ointment to area bid x 2 weeks for a flare and then use 2 - 3 times a week at hs for maintenance dosing.   Next breast/pelvic exam/pap in 2 years and office visit in 1 year for recheck of vulvitis.   After visit summary provided.   22 min  total time was spent for this patient encounter, including preparation, face-to-face counseling with the patient, coordination of care, and documentation of the encounter.

## 2021-08-29 LAB — CYTOLOGY - PAP
Comment: NEGATIVE
Diagnosis: NEGATIVE
High risk HPV: NEGATIVE

## 2021-09-25 IMAGING — MG DIGITAL SCREENING BILAT W/ TOMO W/ CAD
6 of 12 series · 6 of 36 positions shown · non-contrast
Comparison: Previous exam(s).

CLINICAL DATA: Screening.

EXAM:
DIGITAL SCREENING BILATERAL MAMMOGRAM WITH TOMO AND CAD

[R XCCL synth-2D]
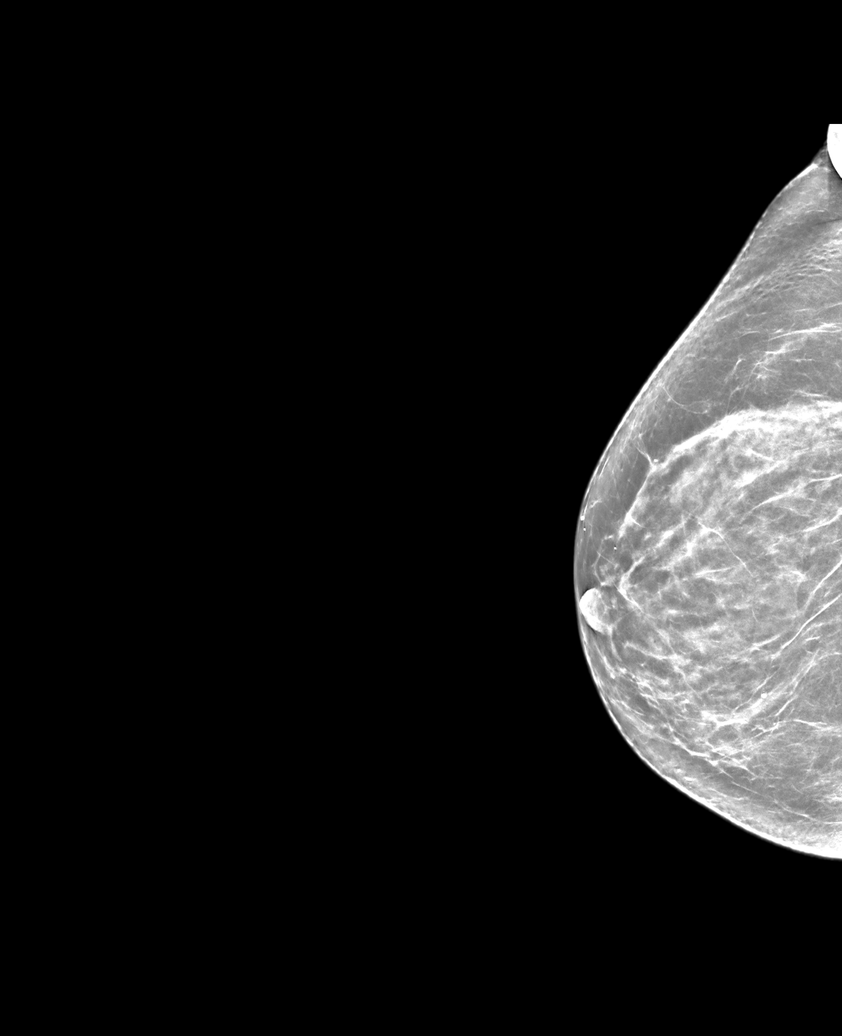

[L MLO synth-2D]
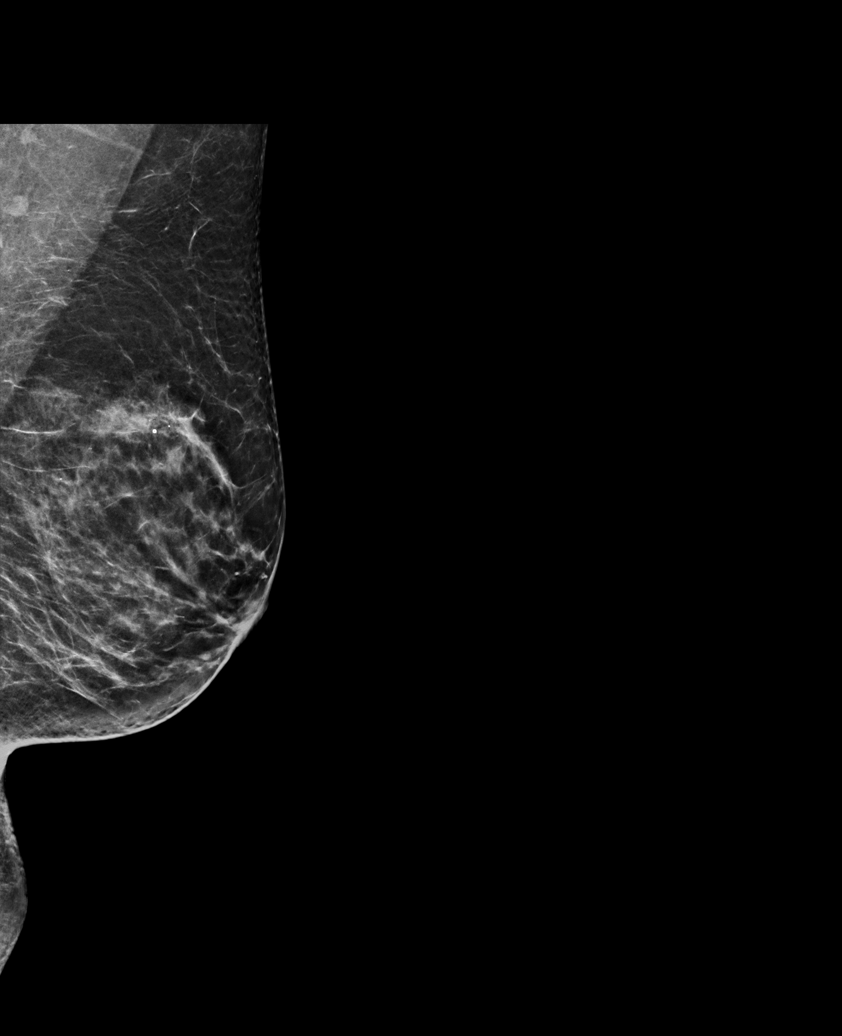

[R CC synth-2D]
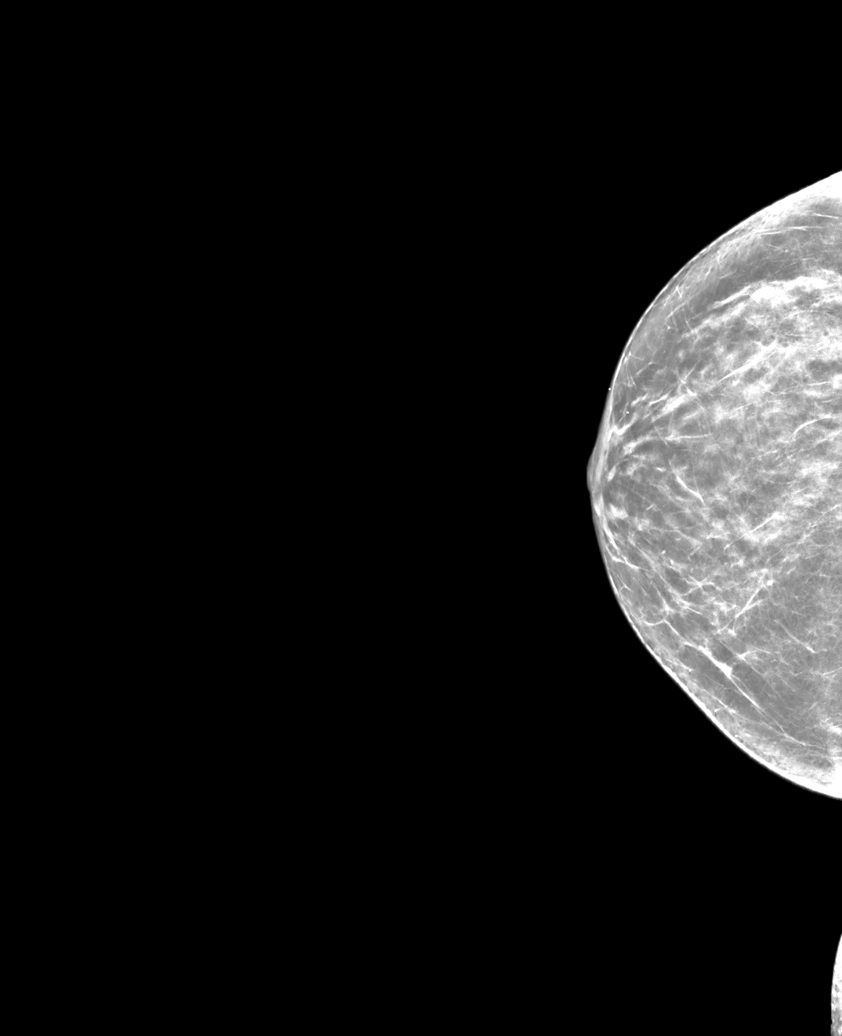

[R MLO synth-2D]
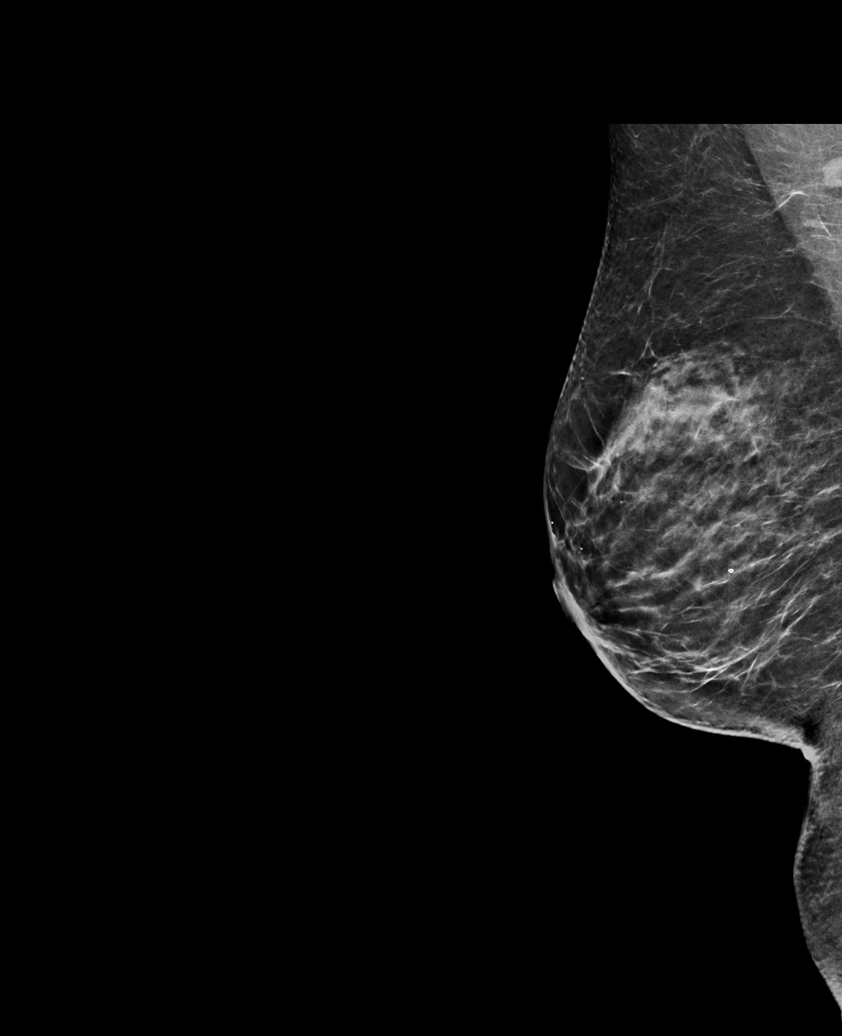

[L XCCL synth-2D]
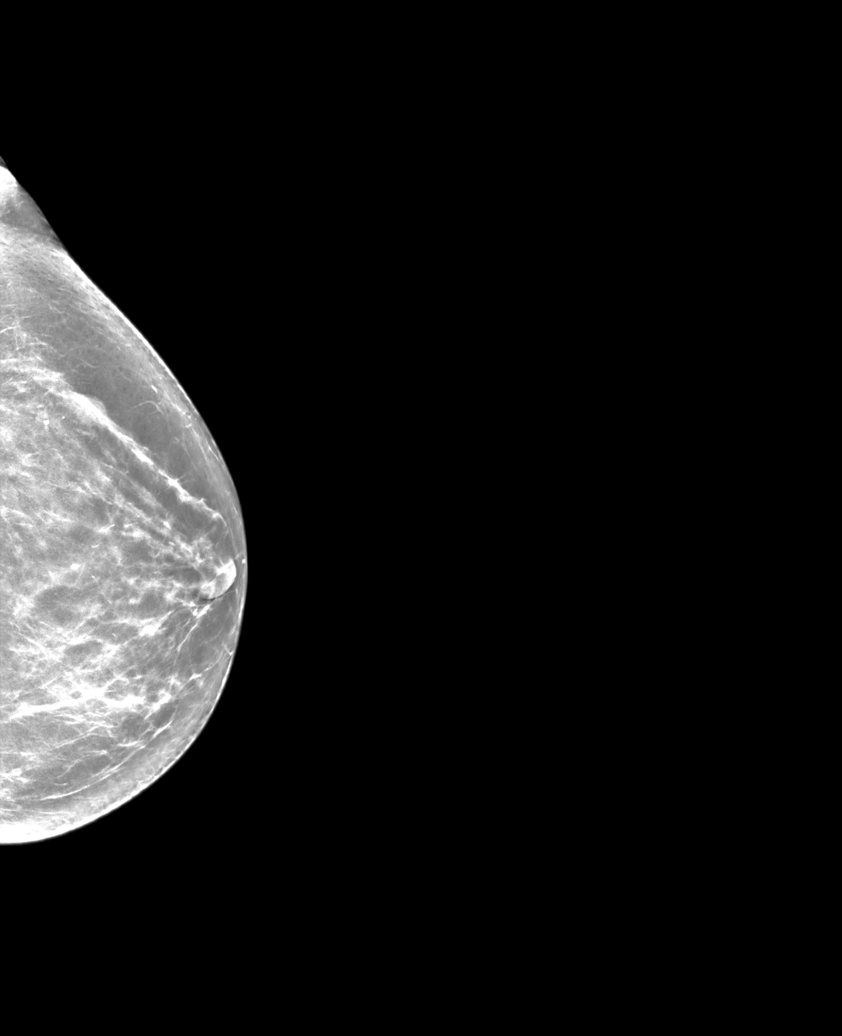

[L CC synth-2D]
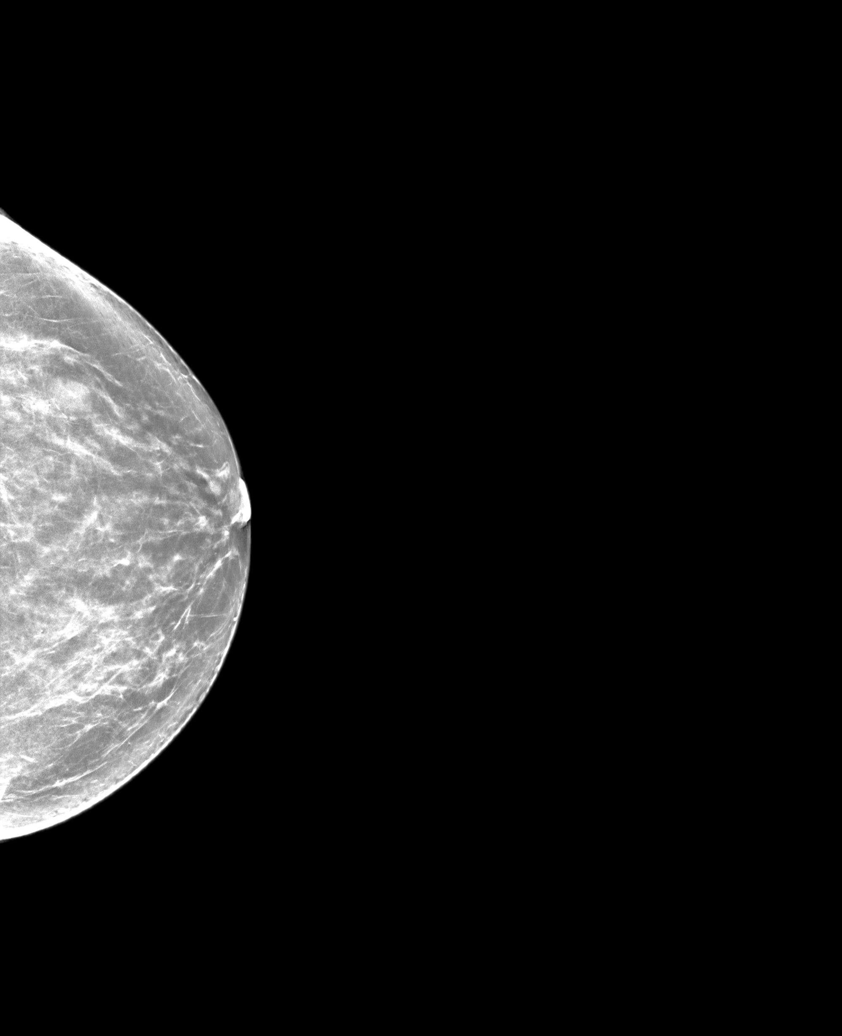

[6 of 36 positions shown; findings below may reference images not displayed]

ACR Breast Density Category c: The breast tissue is heterogeneously
dense, which may obscure small masses.
FINDINGS: There are no findings suspicious for malignancy. Images were
processed with CAD.
IMPRESSION: No mammographic evidence of malignancy. A result letter of this
screening mammogram will be mailed directly to the patient.

RECOMMENDATION:
Screening mammogram in one year. (Code:FT-U-LHB)

BI-RADS CATEGORY  1: Negative.

## 2021-10-24 ENCOUNTER — Encounter: Payer: Self-pay | Admitting: Family Medicine

## 2021-10-24 ENCOUNTER — Ambulatory Visit: Payer: Medicare PPO | Admitting: Family Medicine

## 2021-10-24 VITALS — BP 126/73 | HR 68 | Temp 98.1°F | Ht 62.25 in | Wt 150.0 lb

## 2021-10-24 DIAGNOSIS — T781XXD Other adverse food reactions, not elsewhere classified, subsequent encounter: Secondary | ICD-10-CM

## 2021-10-24 DIAGNOSIS — I10 Essential (primary) hypertension: Secondary | ICD-10-CM | POA: Diagnosis not present

## 2021-10-24 DIAGNOSIS — R058 Other specified cough: Secondary | ICD-10-CM

## 2021-10-24 DIAGNOSIS — J4520 Mild intermittent asthma, uncomplicated: Secondary | ICD-10-CM

## 2021-10-24 DIAGNOSIS — M810 Age-related osteoporosis without current pathological fracture: Secondary | ICD-10-CM

## 2021-10-24 MED ORDER — AMLODIPINE BESYLATE 5 MG PO TABS: 5.0000 mg | ORAL_TABLET | Freq: Every day | ORAL | 1 refills | Status: DC

## 2021-10-24 NOTE — Progress Notes (Signed)
? ?This visit occurred during the SARS-CoV-2 public health emergency.  Safety protocols were in place, including screening questions prior to the visit, additional usage of staff PPE, and extensive cleaning of exam room while observing appropriate contact time as indicated for disinfecting solutions.  ? ? ?Patient ID: Alexandria Rush, female  DOB: 1950-11-12, 71 y.o.   MRN: [id number] ?Patient Care Team  ?  Relationship Specialty Notifications Start End  ?Natalia Leatherwood, DO PCP - General Family Medicine  08/20/18   ?Nyoka Cowden, MD Consulting Physician Pulmonary Disease  08/20/18   ?Janyth Contes, MD    08/20/18   ? Comment: thoracic surgeon  ?Vida Rigger, MD Consulting Physician Gastroenterology  08/20/18   ? ? ?Chief Complaint  ?Patient presents with  ? Hypertension  ?  CMC; pt is fasting  ? ? ?Subjective: ?SESILIA Rush is a 71 y.o.  Female  present for cmc . ?All past medical history, surgical history, allergies, family history, immunizations, medications and social history were updated in the electronic medical record today. ?All recent labs, ED visits and hospitalizations within the last year were reviewed. ? ?Hypertension:  ?Pt reports compliance with amlodipine 5 mg daily. Patient denies chest pain, shortness of breath, dizziness or lower extremity edema.  ?Pt does not take a daily baby ASA. Pt is is not prescribed statin. ?Diet: Low-sodium ?Exercise: Routine exercise.  ?  ?Osteoporosis: DEXA 07/08/2021 resulted >osteoporosis (-2.9).  Patient also had elevated alkaline phosphatase on labs.  She supplements w/ vitamin D and calcium, as is taking Fosamax weekly for her osteoporosis.  She is tolerating the Fosamax.  She has no nausea, vomiting, abdominal pain. ? ? ? ?  10/24/2021  ? 10:00 AM 05/28/2021  ?  9:52 AM 06/06/2020  ?  8:22 AM 05/28/2020  ?  8:52 AM 05/26/2019  ?  8:28 AM  ?Depression screen PHQ 2/9  ?Decreased Interest 0 0 0 0 0  ?Down, Depressed, Hopeless 0 0 0 0 0  ?PHQ - 2 Score 0 0 0 0 0  ? ?   ?  View : No data to display.  ?  ?  ?  ? ? ? ?Immunization History  ?Administered Date(s) Administered  ? PFIZER(Purple Top)SARS-COV-2 Vaccination 09/16/2019, 10/12/2019, 04/23/2020  ? Tdap 08/21/2013  ? ?Past Medical History:  ?Diagnosis Date  ? Anemia   ? Asthma   ? Chicken pox   ? GERD (gastroesophageal reflux disease)   ? Hypertension   ? MVP (mitral valve prolapse)   ? Nasal septal deviation 12/09/2012  ? Rib fracture 08/27/2017  ? left 6-9 rib fx- seen of f/u CT- xray was normal  ? ?Allergies  ?Allergen Reactions  ? Erythromycin Anaphylaxis  ? Influenza Vaccines Anaphylaxis  ? Penicillins Anaphylaxis  ? Phenylephrine Anaphylaxis  ? Tetracycline Anaphylaxis  ? Tropicamide Swelling  ? Benzalkonium Chloride Rash  ? Fosamax [Alendronate] Nausea Only  ?  Worsened GERD and stomach upset.   ? Tilactase   ?  Other reaction(s): Wheezing ?Other reaction(s): Wheezing ?  ? Lactose Intolerance (Gi) Swelling  ?  wheezing  ? Peach [Prunus Persica] Swelling  ? Pneumococcal Vaccine Other (See Comments)  ?  Pt doesn't take this vaccine as a precaution due to other allergens she has. ?Other reaction(s): Other (See Comments) ?Pt doesn't take this vaccine as a precaution due to other allergens she has. ?  ? Bee Venom Rash  ? Lac Bovis Nausea And Vomiting  ? Neomycin-Bacitracin Zn-Polymyx Rash  ? ?  Past Surgical History:  ?Procedure Laterality Date  ? ADENOIDECTOMY    ? APPENDECTOMY    ? COLONOSCOPY  2019  ? HYSTEROSCOPY    ? NASAL SINUS SURGERY  2014  ? RIB FRACTURE SURGERY  2019  ? TONSILLECTOMY    ? ?Family History  ?Problem Relation Age of Onset  ? Stomach cancer Mother   ? Cancer Mother   ? Hypertension Father   ? Heart disease Father   ? Stroke Father   ? Diabetes Brother   ? Allergies Brother   ? Asthma Brother   ? Allergic rhinitis Brother   ? Diabetes Paternal Aunt   ? Hypertension Brother   ? Allergies Sister   ? Asthma Sister   ? Diabetes Sister   ? Allergic rhinitis Sister   ? Allergies Son   ? Allergies Daughter   ? Cancer  Maternal Aunt   ? Stroke Maternal Uncle   ? Cancer Paternal Aunt   ?     breast  ? Breast cancer Paternal Aunt   ? Cancer Maternal Uncle   ?     pancreatic  ? ?Social History  ? ?Social History Narrative  ? Marital status/children/pets: married, 2 children. [place]  ? Education/employment: masters degree. 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 Safety:   ?   -smoke alarm in the home:Yes  ?   - wears seatbelt: Yes  ?   - Feels safe in their relationships: Yes  ? ? ?Allergies as of 10/24/2021   ? ?   Reactions  ? Erythromycin Anaphylaxis  ? Influenza Vaccines Anaphylaxis  ? Penicillins Anaphylaxis  ? Phenylephrine Anaphylaxis  ? Tetracycline Anaphylaxis  ? Tropicamide Swelling  ? Benzalkonium Chloride Rash  ? Fosamax [alendronate] Nausea Only  ? Worsened GERD and stomach upset.   ? Tilactase   ? Other reaction(s): Wheezing ?Other reaction(s): Wheezing  ? Lactose Intolerance (gi) Swelling  ? wheezing  ? Peach [prunus Persica] Swelling  ? Pneumococcal Vaccine Other (See Comments)  ? Pt doesn't take this vaccine as a precaution due to other allergens she has. ?Other reaction(s): Other (See Comments) ?Pt doesn't take this vaccine as a precaution due to other allergens she has.  ? Bee Venom Rash  ? Lac Bovis Nausea And Vomiting  ? Neomycin-bacitracin Zn-polymyx Rash  ? ?  ? ?  ?Medication List  ?  ? ?  ? Accurate as of October 24, 2021 10:12 AM. If you have any questions, ask your nurse or doctor.  ?  ?  ? ?  ? ?STOP taking these medications   ? ?famotidine 20 MG tablet ?Commonly known as: Pepcid ?Stopped by: Felix Pacinienee Dyamon Sosinski, DO ?  ? ?  ? ?TAKE these medications   ? ?albuterol 108 (90 Base) MCG/ACT inhaler ?Commonly known as: Ventolin HFA ?Inhale 2 puffs into the lungs every 6 (six) hours as needed for wheezing or shortness of breath. ?  ?alendronate 70 MG tablet ?Commonly known as: FOSAMAX ?Take 1 tablet (70 mg total) by mouth once a week. ?  ?amLODipine 5 MG tablet ?Commonly known as: NORVASC ?Take 1 tablet (5 mg total) by mouth  daily. ?  ?EPINEPHrine 0.3 mg/0.3 mL Soaj injection ?Commonly known as: EpiPen 2-Pak ?Inject 0.3 mg into the muscle as needed for anaphylaxis. ?  ?KP Fish Oil 1200 MG Caps ?Take by mouth. ?  ?triamcinolone 0.025 % ointment ?Commonly known as: KENALOG ?Apply 1 application topically 2 (two) times daily. Use for 2 weeks at a time for  a flare.  Use at night two to three times a week for maintenance dosing. ?  ?VITAMIN D3 PO ?Take by mouth. ?  ?Vitamin K1 Powd ?by Does not apply route. ?  ? ?  ? ? ?All past medical history, surgical history, allergies, family history, immunizations andmedications were updated in the EMR today and reviewed under the history and medication portions of their EMR.    ? ? ?ROS: 14 pt review of systems performed and negative (unless mentioned in an HPI) ? ?Objective: ?BP 126/73   Pulse 68   Temp 98.1 ?F (36.7 ?C) (Oral)   Ht 5' 2.25" (1.581 m)   Wt 150 lb (68 kg)   LMP 07/21/2008 (Approximate)   SpO2 98%   BMI 27.22 kg/m?  ?Physical Exam ?Vitals and nursing note reviewed.  ?Constitutional:   ?   General: She is not in acute distress. ?   Appearance: Normal appearance. She is not ill-appearing, toxic-appearing or diaphoretic.  ?HENT:  ?   Head: Normocephalic and atraumatic.  ?Eyes:  ?   General: No scleral icterus.    ?   Right eye: No discharge.     ?   Left eye: No discharge.  ?   Extraocular Movements: Extraocular movements intact.  ?   Conjunctiva/sclera: Conjunctivae normal.  ?   Pupils: Pupils are equal, round, and reactive to light.  ?Cardiovascular:  ?   Rate and Rhythm: Normal rate and regular rhythm.  ?   Heart sounds: No murmur heard. ?Pulmonary:  ?   Effort: Pulmonary effort is normal. No respiratory distress.  ?   Breath sounds: Normal breath sounds. No wheezing, rhonchi or rales.  ?Musculoskeletal:  ?   Cervical back: Neck supple. No tenderness.  ?   Right lower leg: No edema.  ?   Left lower leg: No edema.  ?Lymphadenopathy:  ?   Cervical: No cervical adenopathy.  ?Skin: ?    General: Skin is warm and dry.  ?   Coloration: Skin is not jaundiced or pale.  ?   Findings: No erythema or rash.  ?Neurological:  ?   Mental Status: She is alert and oriented to person, place, and time. Mental

## 2021-10-24 NOTE — Patient Instructions (Addendum)
? ?  Return in about 24 weeks (around 04/10/2022) for Routine chronic condition follow-up. ? ?

## 2021-11-12 ENCOUNTER — Telehealth: Payer: Self-pay | Admitting: Family Medicine

## 2021-11-12 NOTE — Telephone Encounter (Signed)
Spoke with patient [place] was [place] of [place].   ?

## 2021-11-13 ENCOUNTER — Ambulatory Visit: Payer: Medicare PPO | Admitting: Family Medicine

## 2022-01-31 NOTE — Telephone Encounter (Signed)
Pt would like CPE and AWV together. Please advise if can be approved

## 2022-03-04 ENCOUNTER — Telehealth: Payer: Self-pay | Admitting: Family Medicine

## 2022-03-04 NOTE — Telephone Encounter (Signed)
Copied from CRM [id number]. Topic: Medicare AWV >> Mar 04, 2022  3:04 PM Payton Doughty wrote: Reason for CRM: Left message for patient to schedule Annual Wellness Visit.  Please schedule (telephone/video call) with Nurse Health Advisor Lanier Ensign, RN at [facility]. Please call [phone] ask for [facility]

## 2022-03-07 NOTE — Telephone Encounter (Signed)
LM for pt to return call to discuss.  

## 2022-04-01 ENCOUNTER — Telehealth: Payer: Self-pay | Admitting: Family Medicine

## 2022-04-01 NOTE — Telephone Encounter (Signed)
Spoke with patient she stated she was billed $320 for AWV because it was coded wrong.  Declined AWV

## 2022-04-10 ENCOUNTER — Ambulatory Visit: Payer: Medicare PPO | Admitting: Family Medicine

## 2022-05-07 ENCOUNTER — Encounter: Payer: Self-pay | Admitting: Family Medicine

## 2022-05-07 ENCOUNTER — Ambulatory Visit (INDEPENDENT_AMBULATORY_CARE_PROVIDER_SITE_OTHER): Payer: Medicare PPO | Admitting: Family Medicine

## 2022-05-07 VITALS — BP 116/81 | HR 68 | Temp 98.2°F | Wt 152.4 lb

## 2022-05-07 DIAGNOSIS — E663 Overweight: Secondary | ICD-10-CM

## 2022-05-07 DIAGNOSIS — T781XXD Other adverse food reactions, not elsewhere classified, subsequent encounter: Secondary | ICD-10-CM | POA: Diagnosis not present

## 2022-05-07 DIAGNOSIS — M810 Age-related osteoporosis without current pathological fracture: Secondary | ICD-10-CM

## 2022-05-07 DIAGNOSIS — R058 Other specified cough: Secondary | ICD-10-CM

## 2022-05-07 DIAGNOSIS — J4520 Mild intermittent asthma, uncomplicated: Secondary | ICD-10-CM | POA: Diagnosis not present

## 2022-05-07 DIAGNOSIS — I10 Essential (primary) hypertension: Secondary | ICD-10-CM | POA: Diagnosis not present

## 2022-05-07 DIAGNOSIS — B351 Tinea unguium: Secondary | ICD-10-CM

## 2022-05-07 MED ORDER — ALENDRONATE SODIUM 70 MG PO TABS: 70.0000 mg | ORAL_TABLET | ORAL | 11 refills | Status: DC

## 2022-05-07 MED ORDER — ALBUTEROL SULFATE HFA 108 (90 BASE) MCG/ACT IN AERS: 2.0000 | INHALATION_SPRAY | Freq: Four times a day (QID) | RESPIRATORY_TRACT | 11 refills | Status: DC | PRN

## 2022-05-07 MED ORDER — EPINEPHRINE 0.3 MG/0.3ML IJ SOAJ: 0.3000 mg | INTRAMUSCULAR | 0 refills | Status: DC | PRN

## 2022-05-07 MED ORDER — AMLODIPINE BESYLATE 5 MG PO TABS: 5.0000 mg | ORAL_TABLET | Freq: Every day | ORAL | 1 refills | Status: DC

## 2022-05-07 NOTE — Progress Notes (Unsigned)
Patient ID: Alexandria Rush, female  DOB: 29-Oct-1950, 71 y.o.   MRN: [id number] Patient Care Team    Relationship Specialty Notifications Start End  Natalia Leatherwood, DO PCP - General Family Medicine  08/20/18   Nyoka Cowden, MD Consulting Physician Pulmonary Disease  08/20/18   Janyth Contes, MD    08/20/18    Comment: thoracic surgeon  Vida Rigger, MD Consulting Physician Gastroenterology  08/20/18     Chief Complaint  Patient presents with   Hypertension    Pt is not fasting    Subjective: Alexandria Rush is a 71 y.o.  Female  present for cmc . All past medical history, surgical history, allergies, family history, immunizations, medications and social history were updated in the electronic medical record today. All recent labs, ED visits and hospitalizations within the last year were reviewed.  Hypertension: *** Pt reports compliance with amlodipine 5 mg daily. Patient denies chest pain, shortness of breath, dizziness or lower extremity edema.  Pt does not take a daily baby ASA. Pt is is not prescribed statin. Diet: Low-sodium Exercise: Routine exercise.    Osteoporosis: DEXA 07/08/2021 resulted >osteoporosis (-2.9).  Patient also had elevated alkaline phosphatase on labs.  She supplements w/ vitamin D and calcium, as is taking Fosamax weekly for her osteoporosis.  She is tolerating the Fosamax. She has no nausea, vomiting, abdominal pain.      10/24/2021   10:00 AM 05/28/2021    9:52 AM 06/06/2020    8:22 AM 05/28/2020    8:52 AM 05/26/2019    8:28 AM  Depression screen PHQ 2/9  Decreased Interest 0 0 0 0 0  Down, Depressed, Hopeless 0 0 0 0 0  PHQ - 2 Score 0 0 0 0 0       No data to display           Immunization History  Administered Date(s) Administered   PFIZER(Purple Top)SARS-COV-2 Vaccination 09/16/2019, 10/12/2019, 04/23/2020   Tdap 08/21/2013   Past Medical History:  Diagnosis Date   Anemia    Asthma    Chicken pox    GERD (gastroesophageal  reflux disease)    Hypertension    MVP (mitral valve prolapse)    Nasal septal deviation 12/09/2012   Rib fracture 08/27/2017   left 6-9 rib fx- seen of f/u CT- xray was normal   Allergies  Allergen Reactions   Erythromycin Anaphylaxis   Influenza Vaccines Anaphylaxis   Penicillins Anaphylaxis   Phenylephrine Anaphylaxis   Tetracycline Anaphylaxis   Tropicamide Swelling   Benzalkonium Chloride Rash   Fosamax [Alendronate] Nausea Only    Worsened GERD and stomach upset.    Tilactase     Other reaction(s): Wheezing Other reaction(s): Wheezing    Lactose Intolerance (Gi) Swelling    wheezing   Peach [Prunus Persica] Swelling   Peanut Oil     Pt suspects   Pneumococcal Vaccine Other (See Comments)    Pt doesn't take this vaccine as a precaution due to other allergens she has. Other reaction(s): Other (See Comments) Pt doesn't take this vaccine as a precaution due to other allergens she has.    Wound Dressing Adhesive    Bee Venom Rash   Milk (Cow) Nausea And Vomiting   Neomycin-Bacitracin Zn-Polymyx Rash   Past Surgical History:  Procedure Laterality Date   ADENOIDECTOMY     APPENDECTOMY     COLONOSCOPY  2019   HYSTEROSCOPY     NASAL  SINUS SURGERY  2014   RIB FRACTURE SURGERY  2019   TONSILLECTOMY     Family History  Problem Relation Age of Onset   Stomach cancer Mother    Cancer Mother    Hypertension Father    Heart disease Father    Stroke Father    Diabetes Brother    Allergies Brother    Asthma Brother    Allergic rhinitis Brother    Diabetes Paternal Aunt    Hypertension Brother    Allergies Sister    Asthma Sister    Diabetes Sister    Allergic rhinitis Sister    Allergies Son    Allergies Daughter    Cancer Maternal Aunt    Stroke Maternal Uncle    Cancer Paternal Aunt        breast   Breast cancer Paternal Aunt    Cancer Maternal Uncle        pancreatic   Social History   Social History Narrative   Marital status/children/pets: married,  2 children. From [facility]   Education/employment: masters degree. Retired- Sr. Conservator, museum/gallery:      -smoke alarm in the home:Yes     - wears seatbelt: Yes     - Feels safe in their relationships: Yes    Allergies as of 05/07/2022       Reactions   Erythromycin Anaphylaxis   Influenza Vaccines Anaphylaxis   Penicillins Anaphylaxis   Phenylephrine Anaphylaxis   Tetracycline Anaphylaxis   Tropicamide Swelling   Benzalkonium Chloride Rash   Fosamax [alendronate] Nausea Only   Worsened GERD and stomach upset.    Tilactase    Other reaction(s): Wheezing Other reaction(s): Wheezing   Lactose Intolerance (gi) Swelling   wheezing   Peach [prunus Persica] Swelling   Peanut Oil    Pt suspects   Pneumococcal Vaccine Other (See Comments)   Pt doesn't take this vaccine as a precaution due to other allergens she has. Other reaction(s): Other (See Comments) Pt doesn't take this vaccine as a precaution due to other allergens she has.   Wound Dressing Adhesive    Bee Venom Rash   Milk (cow) Nausea And Vomiting   Neomycin-bacitracin Zn-polymyx Rash        Medication List        Accurate as of May 07, 2022 10:08 AM. If you have any questions, ask your nurse or doctor.          albuterol 108 (90 Base) MCG/ACT inhaler Commonly known as: Ventolin HFA Inhale 2 puffs into the lungs every 6 (six) hours as needed for wheezing or shortness of breath.   alendronate 70 MG tablet Commonly known as: FOSAMAX Take 1 tablet (70 mg total) by mouth once a week.   amLODipine 5 MG tablet Commonly known as: NORVASC Take 1 tablet (5 mg total) by mouth daily.   EPINEPHrine 0.3 mg/0.3 mL Soaj injection Commonly known as: EpiPen 2-Pak Inject 0.3 mg into the muscle as needed for anaphylaxis.   KP Fish Oil 1200 MG Caps Take by mouth.   triamcinolone 0.025 % ointment Commonly known as: KENALOG Apply 1 application topically 2 (two) times daily. Use for 2 weeks at a time for a flare.   Use at night two to three times a week for maintenance dosing.   VITAMIN D3 PO Take by mouth.   Vitamin K1 Powd by Does not apply route.        All past medical history, surgical history, allergies,  family history, immunizations andmedications were updated in the EMR today and reviewed under the history and medication portions of their EMR.      ROS: 14 pt review of systems performed and negative (unless mentioned in an HPI)  Objective: BP 116/81   Pulse 68   Temp 98.2 F (36.8 C)   Wt 152 lb 6.4 oz (69.1 kg)   LMP 07/21/2008 (Approximate)   SpO2 98%   BMI 27.65 kg/m  Physical Exam Vitals and nursing note reviewed.  Constitutional:      General: She is not in acute distress.    Appearance: Normal appearance. She is not ill-appearing, toxic-appearing or diaphoretic.  HENT:     Head: Normocephalic and atraumatic.  Eyes:     General: No scleral icterus.       Right eye: No discharge.        Left eye: No discharge.     Extraocular Movements: Extraocular movements intact.     Conjunctiva/sclera: Conjunctivae normal.     Pupils: Pupils are equal, round, and reactive to light.  Cardiovascular:     Rate and Rhythm: Normal rate and regular rhythm.     Heart sounds: No murmur heard. Pulmonary:     Effort: Pulmonary effort is normal. No respiratory distress.     Breath sounds: Normal breath sounds. No wheezing, rhonchi or rales.  Musculoskeletal:     Right lower leg: No edema.     Left lower leg: No edema.  Skin:    General: Skin is warm and dry.     Coloration: Skin is not jaundiced or pale.     Findings: No erythema or rash.  Neurological:     Mental Status: She is alert and oriented to person, place, and time. Mental status is at baseline.     Motor: No weakness.     Gait: Gait normal.  Psychiatric:        Mood and Affect: Mood normal.        Behavior: Behavior normal.        Thought Content: Thought content normal.        Judgment: Judgment normal.     No  results found.  Assessment/plan: LASHEIKA ORTLOFF is a 71 y.o. female present for cmc Mild intermittent extrinsic asthma without status asthmaticus without complication/allergies Stable. Continue albuterol  continue epi pen prn  Benign essential HTN/overweight Stable Continue amlodipine 5 mg qd Low sodium heart health diet and exercise.   Osteoporosis: Dexa q 2 yrs. Due 06/2023 Continue vit d supplement . Continue fosamax.  No follow-ups on file.   No orders of the defined types were placed in this encounter.   No orders of the defined types were placed in this encounter.   Referral Orders  No referral(s) requested today     Electronically signed by: Howard Pouch, [place]

## 2022-05-07 NOTE — Patient Instructions (Signed)
No follow-ups on file.        Great to see you today.  I have refilled the medication(s) we provide.   If labs were collected, we will inform you of lab results once received either by echart message or telephone call.   - echart message- for normal results that have been seen by the patient already.   - telephone call: abnormal results or if patient has not viewed results in their echart.  

## 2022-05-08 DIAGNOSIS — B351 Tinea unguium: Secondary | ICD-10-CM | POA: Insufficient documentation

## 2022-06-17 ENCOUNTER — Encounter: Payer: Self-pay | Admitting: Family Medicine

## 2022-06-17 ENCOUNTER — Ambulatory Visit (INDEPENDENT_AMBULATORY_CARE_PROVIDER_SITE_OTHER): Payer: Medicare PPO | Admitting: Family Medicine

## 2022-06-17 VITALS — BP 125/79 | HR 69 | Temp 97.4°F | Ht 62.21 in | Wt 152.0 lb

## 2022-06-17 DIAGNOSIS — I10 Essential (primary) hypertension: Secondary | ICD-10-CM

## 2022-06-17 DIAGNOSIS — Z1231 Encounter for screening mammogram for malignant neoplasm of breast: Secondary | ICD-10-CM | POA: Diagnosis not present

## 2022-06-17 DIAGNOSIS — Z79899 Other long term (current) drug therapy: Secondary | ICD-10-CM

## 2022-06-17 DIAGNOSIS — M810 Age-related osteoporosis without current pathological fracture: Secondary | ICD-10-CM | POA: Diagnosis not present

## 2022-06-17 DIAGNOSIS — Z0000 Encounter for general adult medical examination without abnormal findings: Secondary | ICD-10-CM

## 2022-06-17 LAB — CBC
HCT: 38.5 % (ref 36.0–46.0)
Hemoglobin: 13 g/dL (ref 12.0–15.0)
MCHC: 33.7 g/dL (ref 30.0–36.0)
MCV: 96.7 fl (ref 78.0–100.0)
Platelets: 277 10*3/uL (ref 150.0–400.0)
RBC: 3.99 Mil/uL (ref 3.87–5.11)
RDW: 14 % (ref 11.5–15.5)
WBC: 6.1 10*3/uL (ref 4.0–10.5)

## 2022-06-17 LAB — COMPREHENSIVE METABOLIC PANEL
ALT: 11 U/L (ref 0–35)
AST: 13 U/L (ref 0–37)
Albumin: 4.3 g/dL (ref 3.5–5.2)
Alkaline Phosphatase: 76 U/L (ref 39–117)
BUN: 14 mg/dL (ref 6–23)
CO2: 30 mEq/L (ref 19–32)
Calcium: 9 mg/dL (ref 8.4–10.5)
Chloride: 104 mEq/L (ref 96–112)
Creatinine, Ser: 0.73 mg/dL (ref 0.40–1.20)
GFR: 83.43 mL/min (ref >60.00)
Glucose, Bld: 86 mg/dL (ref 70–99)
Potassium: 4.5 mEq/L (ref 3.5–5.1)
Sodium: 141 mEq/L (ref 135–145)
Total Bilirubin: 0.4 mg/dL (ref 0.2–1.2)
Total Protein: 6.9 g/dL (ref 6.0–8.3)

## 2022-06-17 LAB — VITAMIN D 25 HYDROXY (VIT D DEFICIENCY, FRACTURES): VITD: 52.3 ng/mL (ref 30.00–100.00)

## 2022-06-17 LAB — LIPID PANEL
Cholesterol: 230 mg/dL — ABNORMAL HIGH (ref 0–200)
HDL: 52.4 mg/dL (ref >39.00)
LDL Cholesterol: 149 mg/dL — ABNORMAL HIGH (ref 0–99)
NonHDL: 177.69
Total CHOL/HDL Ratio: 4
Triglycerides: 144 mg/dL (ref 0.0–149.0)
VLDL: 28.8 mg/dL (ref 0.0–40.0)

## 2022-06-17 LAB — HEMOGLOBIN A1C: Hgb A1c MFr Bld: 5.9 % (ref 4.6–6.5)

## 2022-06-17 LAB — TSH: TSH: 2.21 u[IU]/mL (ref 0.35–5.50)

## 2022-06-17 NOTE — Progress Notes (Signed)
Patient ID: Alexandria Rush, female  DOB: 09/27/1950, 71 y.o.   MRN: [id number] Patient Care Team    Relationship Specialty Notifications Start End  Ma Hillock, DO PCP - General Family Medicine  08/20/18   Tanda Rockers, MD Consulting Physician Pulmonary Disease  08/20/18   Clair Gulling, MD    08/20/18    Comment: thoracic surgeon  Clarene Essex, MD Consulting Physician Gastroenterology  08/20/18     Chief Complaint  Patient presents with   Annual Exam    Pt is fasting    Subjective: Alexandria Rush is a 71 y.o.  Female  present for CPE . All past medical history, surgical history, allergies, family history, immunizations, medications and social history were updated in the electronic medical record today. All recent labs, ED visits and hospitalizations within the last year were reviewed.  Health maintenance:  Colonoscopy: completed 2019, by [facility], f/u - 5-10 years Mammogram: completed:06/2021, >> [facility] Cervical cancer screening: N/A. Pt would like referral to GYN>> referral placed last year Immunizations: tdap UTD 2015, Influenza declined(encouraged yearly), PNA series declined shingrix allergy- declined covid series completed. Covid completed.  Infectious disease screening: Hep C completed.  DEXA: 06/2021- [facility]> 2 yr follow up Assistive device: none Oxygen [id number] Patient has a Dental home. Hospitalizations/ED visits: reviewed       06/17/2022    9:33 AM 10/24/2021   10:00 AM 05/28/2021    9:52 AM 06/06/2020    8:22 AM 05/28/2020    8:52 AM  Depression screen PHQ 2/9  Decreased Interest 0 0 0 0 0  Down, Depressed, Hopeless 0 0 0 0 0  PHQ - 2 Score 0 0 0 0 0       No data to display           Immunization History  Administered Date(s) Administered   PFIZER(Purple Top)SARS-COV-2 Vaccination 09/16/2019, 10/12/2019, 04/23/2020   Tdap 08/21/2013   Past Medical History:  Diagnosis Date   Anemia    Asthma    Chicken pox    GERD (gastroesophageal reflux  disease)    Hypertension    MVP (mitral valve prolapse)    Nasal septal deviation 12/09/2012   Rib fracture 08/27/2017   left 6-9 rib fx- seen of f/u CT- xray was normal   Allergies  Allergen Reactions   Erythromycin Anaphylaxis   Influenza Vaccines Anaphylaxis   Penicillins Anaphylaxis   Phenylephrine Anaphylaxis   Tetracycline Anaphylaxis   Tropicamide Swelling   Benzalkonium Chloride Rash   Fosamax [Alendronate] Nausea Only    Worsened GERD and stomach upset.    Tilactase     Other reaction(s): Wheezing Other reaction(s): Wheezing    Lactose Intolerance (Gi) Swelling    wheezing   Peach [Prunus Persica] Swelling   Peanut Oil     Pt suspects   Pneumococcal Vaccine Other (See Comments)    Pt doesn't take this vaccine as a precaution due to other allergens she has. Other reaction(s): Other (See Comments) Pt doesn't take this vaccine as a precaution due to other allergens she has.    Wound Dressing Adhesive    Bee Venom Rash   Milk (Cow) Nausea And Vomiting   Neomycin-Bacitracin Zn-Polymyx Rash   Past Surgical History:  Procedure Laterality Date   ADENOIDECTOMY     APPENDECTOMY     COLONOSCOPY  2019   HYSTEROSCOPY     NASAL SINUS SURGERY  2014   RIB FRACTURE SURGERY  2019  TONSILLECTOMY     Family History  Problem Relation Age of Onset   Stomach cancer Mother    Cancer Mother    Hypertension Father    Heart disease Father    Stroke Father    Diabetes Brother    Allergies Brother    Asthma Brother    Allergic rhinitis Brother    Diabetes Paternal Aunt    Hypertension Brother    Allergies Sister    Asthma Sister    Diabetes Sister    Allergic rhinitis Sister    Allergies Son    Allergies Daughter    Cancer Maternal Aunt    Stroke Maternal Uncle    Cancer Paternal Aunt        breast   Breast cancer Paternal Aunt    Cancer Maternal Uncle        pancreatic   Social History   Social History Narrative   Marital status/children/pets: married, 2  children. From [facility]   Education/employment: masters degree. Retired- Sr. Quarry manager:      -smoke alarm in the home:Yes     - wears seatbelt: Yes     - Feels safe in their relationships: Yes    Allergies as of 06/17/2022       Reactions   Erythromycin Anaphylaxis   Influenza Vaccines Anaphylaxis   Penicillins Anaphylaxis   Phenylephrine Anaphylaxis   Tetracycline Anaphylaxis   Tropicamide Swelling   Benzalkonium Chloride Rash   Fosamax [alendronate] Nausea Only   Worsened GERD and stomach upset.    Tilactase    Other reaction(s): Wheezing Other reaction(s): Wheezing   Lactose Intolerance (gi) Swelling   wheezing   Peach [prunus Persica] Swelling   Peanut Oil    Pt suspects   Pneumococcal Vaccine Other (See Comments)   Pt doesn't take this vaccine as a precaution due to other allergens she has. Other reaction(s): Other (See Comments) Pt doesn't take this vaccine as a precaution due to other allergens she has.   Wound Dressing Adhesive    Bee Venom Rash   Milk (cow) Nausea And Vomiting   Neomycin-bacitracin Zn-polymyx Rash        Medication List        Accurate as of June 17, 2022  9:49 AM. If you have any questions, ask your nurse or doctor.          albuterol 108 (90 Base) MCG/ACT inhaler Commonly known as: Ventolin HFA Inhale 2 puffs into the lungs every 6 (six) hours as needed for wheezing or shortness of breath.   alendronate 70 MG tablet Commonly known as: FOSAMAX Take 1 tablet (70 mg total) by mouth once a week.   amLODipine 5 MG tablet Commonly known as: NORVASC Take 1 tablet (5 mg total) by mouth daily.   EPINEPHrine 0.3 mg/0.3 mL Soaj injection Commonly known as: EpiPen 2-Pak Inject 0.3 mg into the muscle as needed for anaphylaxis.   KP Fish Oil 1200 MG Caps Take by mouth.   triamcinolone 0.025 % ointment Commonly known as: KENALOG Apply 1 application topically 2 (two) times daily. Use for 2 weeks at a time for a flare.   Use at night two to three times a week for maintenance dosing.   VITAMIN D3 PO Take by mouth.   Vitamin K1 Powd by Does not apply route.        All past medical history, surgical history, allergies, family history, immunizations andmedications were updated in the EMR today and reviewed  under the history and medication portions of their EMR.       ROS: 14 pt review of systems performed and negative (unless mentioned in an HPI)  Objective: BP 125/79   Pulse 69   Temp (!) 97.4 F (36.3 C)   Ht 5' 2.21" (1.58 m)   Wt 152 lb (68.9 kg)   LMP 07/21/2008 (Approximate)   SpO2 100%   BMI 27.62 kg/m  Physical Exam Vitals and nursing note reviewed.  Constitutional:      General: She is not in acute distress.    Appearance: Normal appearance. She is not ill-appearing or toxic-appearing.  HENT:     Head: Normocephalic and atraumatic.     Right Ear: Tympanic membrane, ear canal and external ear normal. There is no impacted cerumen.     Left Ear: Tympanic membrane, ear canal and external ear normal. There is no impacted cerumen.     Nose: No congestion or rhinorrhea.     Mouth/Throat:     Mouth: Mucous membranes are moist.     Pharynx: Oropharynx is clear. No oropharyngeal exudate or posterior oropharyngeal erythema.  Eyes:     General: No scleral icterus.       Right eye: No discharge.        Left eye: No discharge.     Extraocular Movements: Extraocular movements intact.     Conjunctiva/sclera: Conjunctivae normal.     Pupils: Pupils are equal, round, and reactive to light.  Cardiovascular:     Rate and Rhythm: Normal rate and regular rhythm.     Pulses: Normal pulses.     Heart sounds: Normal heart sounds. No murmur heard.    No friction rub. No gallop.  Pulmonary:     Effort: Pulmonary effort is normal. No respiratory distress.     Breath sounds: Normal breath sounds. No stridor. No wheezing, rhonchi or rales.  Chest:     Chest wall: No tenderness.  Abdominal:      General: Abdomen is flat. Bowel sounds are normal. There is no distension.     Palpations: Abdomen is soft. There is no mass.     Tenderness: There is no abdominal tenderness. There is no right CVA tenderness, left CVA tenderness, guarding or rebound.     Hernia: No hernia is present.  Musculoskeletal:        General: No swelling, tenderness or deformity. Normal range of motion.     Cervical back: Normal range of motion and neck supple. No rigidity or tenderness.     Right lower leg: No edema.     Left lower leg: No edema.  Lymphadenopathy:     Cervical: No cervical adenopathy.  Skin:    General: Skin is warm and dry.     Coloration: Skin is not jaundiced or pale.     Findings: No bruising, erythema, lesion or rash.  Neurological:     General: No focal deficit present.     Mental Status: She is alert and oriented to person, place, and time. Mental status is at baseline.     Cranial Nerves: No cranial nerve deficit.     Sensory: No sensory deficit.     Motor: No weakness.     Coordination: Coordination normal.     Gait: Gait normal.     Deep Tendon Reflexes: Reflexes normal.  Psychiatric:        Mood and Affect: Mood normal.        Behavior: Behavior normal.  Thought Content: Thought content normal.        Judgment: Judgment normal.     No results found.  Assessment/plan: Alexandria Rush is a 71 y.o. female present for CPE Benign essential HTN/overweight Continue routine follow ups. .  Cbc, cmp, tsh, lipids collected today  Osteoporosis without current pathological fracture, unspecified osteoporosis type Vit d collected today Dexa due 06/2023  Encounter for long-term current use of medication A1c collected today  Routine general medical examination at a health care facility Colonoscopy: completed 2019, by [facility], f/u - 5-10 years Mammogram: completed:06/2021, >> [facility] Cervical cancer screening: N/A. Pt would like referral to GYN>> referral placed last  year Immunizations: tdap UTD 2015, Influenza declined(encouraged yearly), PNA series declined shingrix allergy- declined covid series completed. Covid completed.  Infectious disease screening: Hep C completed.  DEXA: 06/2021- [facility]P> 2 yr follow up  Return in about 1 year (around 06/19/2023) for cpe (20 min).  Orders Placed This Encounter  Procedures   MM 3D SCREEN BREAST BILATERAL   CBC   TSH   Lipid panel   Comp Met (CMET)   Hemoglobin A1c   Vitamin D (25 hydroxy)   No orders of the defined types were placed in this encounter.   Referral Orders  No referral(s) requested today     Electronically signed by: Howard Pouch, [place]

## 2022-06-17 NOTE — Patient Instructions (Signed)
No follow-ups on file.        Great to see you today.  I have refilled the medication(s) we provide.   If labs were collected, we will inform you of lab results once received either by echart message or telephone call.   - echart message- for normal results that have been seen by the patient already.   - telephone call: abnormal results or if patient has not viewed results in their echart.   Health Maintenance After Age 70 After age 70, you are at a higher risk for certain long-term diseases and infections as well as injuries from falls. Falls are a major cause of broken bones and head injuries in people who are older than age 70. Getting regular preventive care can help to keep you healthy and well. Preventive care includes getting regular testing and making lifestyle changes as recommended by your health care provider. Talk with your health care provider about: Which screenings and tests you should have. A screening is a test that checks for a disease when you have no symptoms. A diet and exercise plan that is right for you. What should I know about screenings and tests to prevent falls? Screening and testing are the best ways to find a health problem early. Early diagnosis and treatment give you the best chance of managing medical conditions that are common after age 70. Certain conditions and lifestyle choices may make you more likely to have a fall. Your health care provider may recommend: Regular vision checks. Poor vision and conditions such as cataracts can make you more likely to have a fall. If you wear glasses, make sure to get your prescription updated if your vision changes. Medicine review. Work with your health care provider to regularly review all of the medicines you are taking, including over-the-counter medicines. Ask your health care provider about any side effects that may make you more likely to have a fall. Tell your health care provider if any medicines that you take  make you feel dizzy or sleepy. Strength and balance checks. Your health care provider may recommend certain tests to check your strength and balance while standing, walking, or changing positions. Foot health exam. Foot pain and numbness, as well as not wearing proper footwear, can make you more likely to have a fall. Screenings, including: Osteoporosis screening. Osteoporosis is a condition that causes the bones to get weaker and break more easily. Blood pressure screening. Blood pressure changes and medicines to control blood pressure can make you feel dizzy. Depression screening. You may be more likely to have a fall if you have a fear of falling, feel depressed, or feel unable to do activities that you used to do. Alcohol use screening. Using too much alcohol can affect your balance and may make you more likely to have a fall. Follow these instructions at home: Lifestyle Do not drink alcohol if: Your health care provider tells you not to drink. If you drink alcohol: Limit how much you have to: 0-1 drink a day for women. 0-2 drinks a day for men. Know how much alcohol is in your drink. In the [place] one drink equals one 12 oz bottle of beer (355 mL), one 5 oz glass of wine (148 mL), or one 1 oz glass of hard liquor (44 mL). Do not use any products that contain nicotine or tobacco. These products include cigarettes, chewing tobacco, and vaping devices, such as e-cigarettes. If you need help quitting, ask your health care provider. Activity    Follow a regular exercise program to stay fit. This will help you maintain your balance. Ask your health care provider what types of exercise are appropriate for you. If you need a cane or walker, use it as recommended by your health care provider. Wear supportive shoes that have nonskid soles. Safety  Remove any tripping hazards, such as rugs, cords, and clutter. Install safety equipment such as grab bars in bathrooms and safety rails on stairs. Keep  rooms and walkways well-lit. General instructions Talk with your health care provider about your risks for falling. Tell your health care provider if: You fall. Be sure to tell your health care provider about all falls, even ones that seem minor. You feel dizzy, tiredness (fatigue), or off-balance. Take over-the-counter and prescription medicines only as told by your health care provider. These include supplements. Eat a healthy diet and maintain a healthy weight. A healthy diet includes low-fat dairy products, low-fat (lean) meats, and fiber from whole grains, beans, and lots of fruits and vegetables. Stay current with your vaccines. Schedule regular health, dental, and eye exams. Summary Having a healthy lifestyle and getting preventive care can help to protect your health and wellness after age 70. Screening and testing are the best way to find a health problem early and help you avoid having a fall. Early diagnosis and treatment give you the best chance for managing medical conditions that are more common for people who are older than age 70. Falls are a major cause of broken bones and head injuries in people who are older than age 70. Take precautions to prevent a fall at home. Work with your health care provider to learn what changes you can make to improve your health and wellness and to prevent falls. This information is not intended to replace advice given to you by your health care provider. Make sure you discuss any questions you have with your health care provider. Document Revised: 11/26/2020 Document Reviewed: 11/26/2020 Elsevier Patient Education  2023 Elsevier Inc.  

## 2022-06-19 ENCOUNTER — Encounter: Payer: Self-pay | Admitting: Family Medicine

## 2022-06-26 ENCOUNTER — Ambulatory Visit: Payer: Medicare PPO | Admitting: Obstetrics and Gynecology

## 2022-07-18 ENCOUNTER — Telehealth: Payer: Self-pay | Admitting: Family Medicine

## 2022-07-18 NOTE — Telephone Encounter (Signed)
Declined  due to the [facility] bills the AWV she get charge for it.

## 2022-08-11 ENCOUNTER — Ambulatory Visit (HOSPITAL_BASED_OUTPATIENT_CLINIC_OR_DEPARTMENT_OTHER)
Admission: RE | Admit: 2022-08-11 | Discharge: 2022-08-11 | Disposition: A | Discharge status: Home / Self Care | Payer: Medicare PPO | Source: Ambulatory Visit | Attending: Family Medicine | Admitting: Family Medicine

## 2022-08-11 ENCOUNTER — Encounter (HOSPITAL_BASED_OUTPATIENT_CLINIC_OR_DEPARTMENT_OTHER): Payer: Self-pay

## 2022-08-11 DIAGNOSIS — Z1231 Encounter for screening mammogram for malignant neoplasm of breast: Secondary | ICD-10-CM | POA: Insufficient documentation

## 2022-08-13 ENCOUNTER — Telehealth: Payer: Self-pay

## 2022-08-13 ENCOUNTER — Telehealth: Payer: Self-pay | Admitting: Family Medicine

## 2022-08-13 NOTE — Telephone Encounter (Signed)
Copied from [place] [id number]. Topic: Medicare AWV >> Aug 13, 2022 10:33 AM Gillis Santa wrote: Reason for CRM: LVM PATIENT TO CALL [place] TELE VISIT

## 2022-08-13 NOTE — Telephone Encounter (Signed)
Patient called regarding mammogram results.  I told her per Dr. Lucita Lora note - mammogram is normal. Patient did not have any questions or concerns.

## 2022-08-13 NOTE — Telephone Encounter (Signed)
Noted  

## 2022-09-08 NOTE — Progress Notes (Signed)
72 y.o. G2P2 Married Caucasian female here for 1 yr f/u on meds.  Pt has also noticed pelvic and lower back pain.  She has pain, sometimes significant, for the last month.  Pain comes and goes.  Not associated with constipation, diarrhea, or pain with urination.  Has a daily BM.  Her low back pain is midline.   Has not taken pain medication for these pains.   Denies vaginal bleeding.  Hx hysteroscopic polypectomy.  She is followed for chronic vulvitis.  Using triamcinolone once every couple of months.   She is asking about Cologuard.   PCP:   Dr. Raoul Pitch.   Patient's last menstrual period was 07/21/2008 (approximate).           Sexually active: Yes.    The current method of family planning is post menopausal status.    Exercising: Yes.     Walking 3-5 miles a day, silver sneakers Smoker:  no  Health Maintenance: Pap:  08/28/21 neg: HR HPV neg, 05/31/19 neg History of abnormal Pap:  no MMG:  08/11/22 Breast Density Category B, BI-RADS CATEGORY 1 Neg Colonoscopy:  11/05/17 - due in 5 - 10 years. Dr. Watt Climes.  BMD:   07/08/21  Result  osteoporotic TDaP:  08/21/13 Gardasil:   no HIV: unsure  Hep C:05/26/19 neg Screening Labs:  PCP   reports that she has never smoked. She has never used smokeless tobacco. She reports that she does not currently use alcohol. She reports that she does not use drugs.  Past Medical History:  Diagnosis Date   Anemia    Asthma    Chicken pox    GERD (gastroesophageal reflux disease)    Hypertension    MVP (mitral valve prolapse)    Nasal septal deviation 12/09/2012   Rib fracture 08/27/2017   left 6-9 rib fx- seen of f/u CT- xray was normal    Past Surgical History:  Procedure Laterality Date   ADENOIDECTOMY     APPENDECTOMY     COLONOSCOPY  2019   HYSTEROSCOPY     NASAL SINUS SURGERY  2014   RIB FRACTURE SURGERY  2019   TONSILLECTOMY      Current Outpatient Medications  Medication Sig Dispense Refill   albuterol (VENTOLIN HFA) 108 (90  Base) MCG/ACT inhaler Inhale 2 puffs into the lungs every 6 (six) hours as needed for wheezing or shortness of breath. 8 g 11   alendronate (FOSAMAX) 70 MG tablet Take 1 tablet (70 mg total) by mouth once a week. 4 tablet 11   amLODipine (NORVASC) 5 MG tablet Take 1 tablet (5 mg total) by mouth daily. 90 tablet 1   Cholecalciferol (VITAMIN D3 PO) Take by mouth.     EPINEPHrine (EPIPEN 2-PAK) 0.3 mg/0.3 mL IJ SOAJ injection Inject 0.3 mg into the muscle as needed for anaphylaxis. 1 each 0   Omega-3 Fatty Acids (KP FISH OIL) 1200 MG CAPS Take by mouth.     Phytonadione (VITAMIN K1) POWD by Does not apply route.     triamcinolone (KENALOG) 0.025 % ointment Apply 1 application topically 2 (two) times daily. Use for 2 weeks at a time for a flare.  Use at night two to three times a week for maintenance dosing. 30 g 2   No current facility-administered medications for this visit.    Family History  Problem Relation Age of Onset   Stomach cancer Mother    Cancer Mother    Hypertension Father    Heart disease Father  Stroke Father    Diabetes Brother    Allergies Brother    Asthma Brother    Allergic rhinitis Brother    Diabetes Paternal Aunt    Hypertension Brother    Allergies Sister    Asthma Sister    Diabetes Sister    Allergic rhinitis Sister    Allergies Son    Allergies Daughter    Cancer Maternal Aunt    Stroke Maternal Uncle    Cancer Paternal Aunt        breast   Breast cancer Paternal Aunt    Cancer Maternal Uncle        pancreatic    Review of Systems  Genitourinary:  Positive for pelvic pain.  All other systems reviewed and are negative.   Exam:   BP 132/88 (BP Location: Right Arm, Patient Position: Sitting, Cuff Size: Normal)   Pulse 64   Ht '5\' 2"'$  (1.575 m)   Wt 157 lb (71.2 kg)   LMP 07/21/2008 (Approximate)   SpO2 97%   BMI 28.72 kg/m     General appearance: alert, cooperative and appears stated age  Lungs: clear to auscultation bilaterally Heart:  regular rate and rhythm Abdomen: soft, non-tender; no masses, no organomegaly Pelvic: External genitalia:  no lesions              No abnormal inguinal nodes palpated.              Urethra:  normal appearing urethra with no masses, tenderness or lesions              Bartholins and Skenes: normal                 Vagina: normal appearing vagina with normal color and discharge, no lesions              Cervix: no lesions             Bimanual Exam:  Uterus:  normal size, contour, position, consistency, mobility, non-tender              Adnexa: no mass, fullness, tenderness              Rectal exam: yes.  Confirms.              Anus:  normal sphincter tone, no lesions  Chaperone was present for exam:  Emily  Assessment:    Pelvic pain.  Back pain. Status post hysteroscopic polypectomy 2006.  Chronic vulvitis.   Plan:   Pelvic US and follow up.  She will contact her GI office to determine when she needs colonoscopy.  Refill of Triamcinolone.  She will see her PCP regarding her back pain.  Follow up annually and prn.  After visit summary provided.   22 min  total time was spent for this patient encounter, including preparation, face-to-face counseling with the patient, coordination of care, and documentation of the encounter.

## 2022-09-22 ENCOUNTER — Ambulatory Visit: Payer: Medicare PPO | Admitting: Obstetrics and Gynecology

## 2022-09-22 ENCOUNTER — Encounter: Payer: Self-pay | Admitting: Obstetrics and Gynecology

## 2022-09-22 VITALS — BP 132/88 | HR 64 | Ht 62.0 in | Wt 157.0 lb

## 2022-09-22 DIAGNOSIS — R102 Pelvic and perineal pain: Secondary | ICD-10-CM | POA: Diagnosis not present

## 2022-09-22 DIAGNOSIS — N763 Subacute and chronic vulvitis: Secondary | ICD-10-CM

## 2022-09-22 MED ORDER — TRIAMCINOLONE ACETONIDE 0.025 % EX OINT: 1.0000 | TOPICAL_OINTMENT | Freq: Two times a day (BID) | CUTANEOUS | 1 refills | Status: DC

## 2022-09-29 ENCOUNTER — Other Ambulatory Visit: Payer: Self-pay | Admitting: Obstetrics and Gynecology

## 2022-09-29 ENCOUNTER — Other Ambulatory Visit: Payer: Self-pay | Admitting: Family Medicine

## 2022-10-16 NOTE — Progress Notes (Signed)
GYNECOLOGY  VISIT   HPI: 72 y.o.   Married  Caucasian  female   G2P2 with Patient's last menstrual period was 07/21/2008 (approximate).   here for   ultrasound consult for pelvic and lower back pain.  Her husband is present for the visit today.  Pain and discomfort are improved.   She has no postmenopausal bleeding.   GYNECOLOGIC HISTORY: Patient's last menstrual period was 07/21/2008 (approximate). Contraception:  PMP Menopausal hormone therapy:  n/a Last mammogram:  08/11/22 Breast Density Category B, BI-RADS CAT 1 neg Last pap smear:   08/28/21 neg: HR HPV neg, 05/31/19 neg        OB History     Gravida  2   Para  2   Term      Preterm      AB      Living  2      SAB      IAB      Ectopic      Multiple      Live Births                 Patient Active Problem List   Diagnosis Date Noted   Toenail fungus 05/08/2022   Multiple drug allergies 02/28/2020   Adverse food reaction 02/28/2020   Elevated alkaline phosphatase level 06/27/2019   Osteoporosis 06/06/2019   Overweight (BMI 25.0-29.9) 05/26/2019   Other microscopic hematuria 09/22/2018   Contact with and (suspected) exposure to other hazardous metals 09/22/2018   Fibromyalgia 09/09/2018   GERD (gastroesophageal reflux disease) 08/20/2018   Asthma, extrinsic, without status asthmaticus 08/20/2018   Benign essential HTN 08/20/2018   Fothergill's neuralgia (Trigeminal) 08/20/2018   Peptic esophagitis 08/20/2018   Allergic rhinitis due to allergen 08/20/2018   Upper airway cough syndrome 12/02/2013    Past Medical History:  Diagnosis Date   Anemia    Asthma    Chicken pox    GERD (gastroesophageal reflux disease)    Hypertension    MVP (mitral valve prolapse)    Nasal septal deviation 12/09/2012   Rib fracture 08/27/2017   left 6-9 rib fx- seen of f/u CT- xray was normal    Past Surgical History:  Procedure Laterality Date   ADENOIDECTOMY     APPENDECTOMY     COLONOSCOPY  2019    HYSTEROSCOPY     NASAL SINUS SURGERY  2014   RIB FRACTURE SURGERY  2019   TONSILLECTOMY      Current Outpatient Medications  Medication Sig Dispense Refill   albuterol (VENTOLIN HFA) 108 (90 Base) MCG/ACT inhaler Inhale 2 puffs into the lungs every 6 (six) hours as needed for wheezing or shortness of breath. 8 g 11   alendronate (FOSAMAX) 70 MG tablet Take 1 tablet (70 mg total) by mouth once a week. 4 tablet 11   amLODipine (NORVASC) 5 MG tablet TAKE 1 TABLET (5 MG TOTAL) BY MOUTH DAILY. 90 tablet 1   Cholecalciferol (VITAMIN D3 PO) Take by mouth.     EPINEPHrine (EPIPEN 2-PAK) 0.3 mg/0.3 mL IJ SOAJ injection Inject 0.3 mg into the muscle as needed for anaphylaxis. 1 each 0   Omega-3 Fatty Acids (KP FISH OIL) 1200 MG CAPS Take by mouth.     Phytonadione (VITAMIN K1) POWD by Does not apply route.     triamcinolone (KENALOG) 0.025 % ointment Apply 1 Application topically 2 (two) times daily. Use for 2 weeks at a time for a flare.  Use at night two to three  times a week for maintenance dosing. 30 g 1   No current facility-administered medications for this visit.     ALLERGIES: Erythromycin, Influenza vaccines, Penicillins, Phenylephrine, Tetracycline, Tropicamide, Benzalkonium chloride, Fosamax [alendronate], Tilactase, Lactose intolerance (gi), Peach [prunus persica], Peanut oil, Pneumococcal vaccine, Wound dressing adhesive, Bee venom, Milk (cow), and Neomycin-bacitracin zn-polymyx  Family History  Problem Relation Age of Onset   Stomach cancer Mother    Cancer Mother    Hypertension Father    Heart disease Father    Stroke Father    Diabetes Brother    Allergies Brother    Asthma Brother    Allergic rhinitis Brother    Diabetes Paternal Aunt    Hypertension Brother    Allergies Sister    Asthma Sister    Diabetes Sister    Allergic rhinitis Sister    Allergies Son    Allergies Daughter    Cancer Maternal Aunt    Stroke Maternal Uncle    Cancer Paternal Aunt        breast    Breast cancer Paternal Aunt    Cancer Maternal Uncle        pancreatic    Social History   Socioeconomic History   Marital status: Married    Spouse name: Not on file   Number of children: Not on file   Years of education: Not on file   Highest education level: Master's degree (e.g., MA, MS, MEng, MEd, MSW, MBA)  Occupational History   Occupation: Teaching laboratory technicianroject Manager    Employer: [employer]  Tobacco Use   Smoking status: Never   Smokeless tobacco: Never  Vaping Use   Vaping Use: Never used  Substance and Sexual Activity   Alcohol use: Not Currently   Drug use: No   Sexual activity: Yes    Birth control/protection: Post-menopausal    Comment: 1st intercourse 72 yo-Fewer than 5 partners  Other Topics Concern   Not on file  Social History Narrative   Marital status/children/pets: married, [place] children. [place]   Education/employment: masters degree. Retired- Sr. 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   Safety:      -smoke alarm in the home:Yes     - wears seatbelt: Yes     - Feels safe in their relationships: Yes   Social Determinants of Health   Financial Resource Strain: Medium Risk (05/06/2022)   Overall Financial Resource Strain (CARDIA)    Difficulty of Paying Living Expenses: Somewhat hard  Food Insecurity: No Food Insecurity (05/06/2022)   Hunger Vital Sign    Worried About Running Out of Food in the Last Year: Never true    Ran Out of Food in the Last Year: Never true  Transportation Needs: No Transportation Needs (05/06/2022)   PRAPARE - Administrator, Civil ServiceTransportation    Lack of Transportation (Medical): No    Lack of Transportation (Non-Medical): No  Physical Activity: Sufficiently Active (05/06/2022)   Exercise Vital Sign    Days of Exercise per Week: 7 days    Minutes of Exercise per Session: 150+ min  Stress: No Stress Concern Present (05/06/2022)   [employer] - Occupational Stress Questionnaire    Feeling of Stress : Not at all  Social Connections:  Moderately Integrated (05/06/2022)   Social Connection and Isolation Panel [NHANES]    Frequency of Communication with Friends and Family: Once a week    Frequency of Social Gatherings with Friends and Family: Once a week    Attends Religious Services: More than 4 times  per year    Active Member of Clubs or Organizations: Yes    Attends BankerClub or Organization Meetings: 1 to 4 times per year    Marital Status: Married  Catering managerntimate Partner Violence: Not At Risk (06/06/2020)   Humiliation, Afraid, Rape, and Kick questionnaire    Fear of Current or Ex-Partner: No    Emotionally Abused: No    Physically Abused: No    Sexually Abused: No    Review of Systems  All other systems reviewed and are negative.   PHYSICAL EXAMINATION:    BP 118/80 (BP Location: Right Arm, Patient Position: Sitting, Cuff Size: Normal)   Pulse 66   Ht 5\' 2"  (1.575 m)   Wt 156 lb (70.8 kg)   LMP 07/21/2008 (Approximate)   SpO2 98%   BMI 28.53 kg/m     General appearance: alert, cooperative and appears stated age  Pelvic US Uterus 6.24 x 3.78 x 2.46 cm.  No myometrial masses.  EMS 1.52 mm.  Thins symmetrical.  Left ovary 1.34 x 0.80 x 0.89 cm.  Normal perfusion.  Right ovary 1.31 x 0.73 x 0.77 cm.  Normal perfusion. No adnexal masses.  No free fluid.    ASSESSMENT  Pelvic pain.  Back pain.   PLAN  US images and report reviewed.  Reassurance given regarding appearance of reproductive organs on pelvic US. She will follow up with Dr. Ewing SchleinMagod, her GI.  I also dicussed she can follow up with her PCP.  FU here prn.   16 min  total time was spent for this patient encounter, including preparation, face-to-face counseling with the patient, coordination of care, and documentation of the encounter.

## 2022-10-30 ENCOUNTER — Encounter: Payer: Self-pay | Admitting: Obstetrics and Gynecology

## 2022-10-30 ENCOUNTER — Ambulatory Visit (INDEPENDENT_AMBULATORY_CARE_PROVIDER_SITE_OTHER): Payer: Medicare PPO

## 2022-10-30 ENCOUNTER — Ambulatory Visit: Payer: Medicare PPO | Admitting: Obstetrics and Gynecology

## 2022-10-30 VITALS — BP 118/80 | HR 66 | Ht 62.0 in | Wt 156.0 lb

## 2022-10-30 DIAGNOSIS — R102 Pelvic and perineal pain: Secondary | ICD-10-CM

## 2022-11-14 DIAGNOSIS — S60454A Superficial foreign body of right ring finger, initial encounter: Secondary | ICD-10-CM | POA: Diagnosis not present

## 2022-11-15 DIAGNOSIS — W458XXA Other foreign body or object entering through skin, initial encounter: Secondary | ICD-10-CM | POA: Diagnosis not present

## 2022-11-15 DIAGNOSIS — Y93E5 Activity, floor mopping and cleaning: Secondary | ICD-10-CM | POA: Diagnosis not present

## 2022-11-15 DIAGNOSIS — S60454A Superficial foreign body of right ring finger, initial encounter: Secondary | ICD-10-CM | POA: Diagnosis not present

## 2022-11-20 DIAGNOSIS — R1031 Right lower quadrant pain: Secondary | ICD-10-CM | POA: Diagnosis not present

## 2023-01-07 DIAGNOSIS — K5730 Diverticulosis of large intestine without perforation or abscess without bleeding: Secondary | ICD-10-CM | POA: Diagnosis not present

## 2023-01-07 DIAGNOSIS — Z83719 Family history of colon polyps, unspecified: Secondary | ICD-10-CM | POA: Diagnosis not present

## 2023-01-07 DIAGNOSIS — Z1211 Encounter for screening for malignant neoplasm of colon: Secondary | ICD-10-CM | POA: Diagnosis not present

## 2023-01-07 DIAGNOSIS — K649 Unspecified hemorrhoids: Secondary | ICD-10-CM | POA: Diagnosis not present

## 2023-01-07 LAB — HM COLONOSCOPY

## 2023-02-02 ENCOUNTER — Encounter: Payer: Self-pay | Admitting: Family Medicine

## 2023-02-02 ENCOUNTER — Ambulatory Visit: Payer: Medicare PPO | Admitting: Family Medicine

## 2023-02-02 VITALS — BP 112/76 | HR 60 | Temp 98.0°F | Wt 151.8 lb

## 2023-02-02 DIAGNOSIS — H6121 Impacted cerumen, right ear: Secondary | ICD-10-CM

## 2023-02-02 DIAGNOSIS — H9201 Otalgia, right ear: Secondary | ICD-10-CM | POA: Diagnosis not present

## 2023-02-02 MED ORDER — DEBROX 6.5 % OT SOLN: 5.0000 [drp] | Freq: Two times a day (BID) | OTIC | 1 refills | Status: DC

## 2023-02-02 NOTE — Progress Notes (Signed)
Alexandria Rush , Jun 15, 1951, 72 y.o., female MRN: [id number] Patient Care Team    Relationship Specialty Notifications Start End  Natalia Leatherwood, DO PCP - General Family Medicine  08/20/18   Nyoka Cowden, MD Consulting Physician Pulmonary Disease  08/20/18   Janyth Contes, MD    08/20/18    Comment: thoracic surgeon  Vida Rigger, MD Consulting Physician Gastroenterology  08/20/18   Patton Salles, MD Consulting Physician Obstetrics and Gynecology  09/22/22     Chief Complaint  Patient presents with   Ear Pain    June 14th, right side of face and neck pain/pressure; has been using advil and mucinex     Subjective: Alexandria Rush is a 72 y.o. Pt presents for an OV with complaints of right ear pain of 4 weeks duration.  Associated symptoms include right side of face and neck discomfort. She endorses max sinus pressure and watery eyes. She noticed a sharp pain on plane descent.  She endorses nasal drainage, foul taste in mouth She took mucinex, Claritin and advil.   H/o right sided trigeminal neuralgia.      02/02/2023    9:36 AM 06/17/2022    9:33 AM 10/24/2021   10:00 AM 05/28/2021    9:52 AM 06/06/2020    8:22 AM  Depression screen PHQ 2/9  Decreased Interest 0 0 0 0 0  Down, Depressed, Hopeless 0 0 0 0 0  PHQ - 2 Score 0 0 0 0 0    Allergies  Allergen Reactions   Erythromycin Anaphylaxis   Influenza Vaccines Anaphylaxis   Penicillins Anaphylaxis   Phenylephrine Anaphylaxis   Tetracycline Anaphylaxis   Tropicamide Swelling   Benzalkonium Chloride Rash   Fosamax [Alendronate] Nausea Only    Worsened GERD and stomach upset.    Tilactase     Other reaction(s): Wheezing Other reaction(s): Wheezing    Lactose Intolerance (Gi) Swelling    wheezing   Peach [Prunus Persica] Swelling   Peanut Oil     Pt suspects   Pneumococcal Vaccine Other (See Comments)    Pt doesn't take this vaccine as a precaution due to other allergens she has. Other  reaction(s): Other (See Comments) Pt doesn't take this vaccine as a precaution due to other allergens she has.    Wound Dressing Adhesive    Bee Venom Rash   Milk (Cow) Nausea And Vomiting   Neomycin-Bacitracin Zn-Polymyx Rash   Social History   Social History Narrative   Marital status/children/pets: married, 2 children. From [facility]   Education/employment: masters degree. Retired- Sr. Conservator, museum/gallery:      -smoke alarm in the home:Yes     - wears seatbelt: Yes     - Feels safe in their relationships: Yes   Past Medical History:  Diagnosis Date   Anemia    Asthma    Chicken pox    GERD (gastroesophageal reflux disease)    Hypertension    MVP (mitral valve prolapse)    Nasal septal deviation 12/09/2012   Rib fracture 08/27/2017   left 6-9 rib fx- seen of f/u CT- xray was normal   Past Surgical History:  Procedure Laterality Date   ADENOIDECTOMY     APPENDECTOMY     COLONOSCOPY  2019   HYSTEROSCOPY     NASAL SINUS SURGERY  2014   RIB FRACTURE SURGERY  2019   TONSILLECTOMY     Family History  Problem  Relation Age of Onset   Stomach cancer Mother    Cancer Mother    Hypertension Father    Heart disease Father    Stroke Father    Diabetes Brother    Allergies Brother    Asthma Brother    Allergic rhinitis Brother    Diabetes Paternal Aunt    Hypertension Brother    Allergies Sister    Asthma Sister    Diabetes Sister    Allergic rhinitis Sister    Allergies Son    Allergies Daughter    Cancer Maternal Aunt    Stroke Maternal Uncle    Cancer Paternal Aunt        breast   Breast cancer Paternal Aunt    Cancer Maternal Uncle        pancreatic   Allergies as of 02/02/2023       Reactions   Erythromycin Anaphylaxis   Influenza Vaccines Anaphylaxis   Penicillins Anaphylaxis   Phenylephrine Anaphylaxis   Tetracycline Anaphylaxis   Tropicamide Swelling   Benzalkonium Chloride Rash   Fosamax [alendronate] Nausea Only   Worsened GERD and stomach  upset.    Tilactase    Other reaction(s): Wheezing Other reaction(s): Wheezing   Lactose Intolerance (gi) Swelling   wheezing   Peach [prunus Persica] Swelling   Peanut Oil    Pt suspects   Pneumococcal Vaccine Other (See Comments)   Pt doesn't take this vaccine as a precaution due to other allergens she has. Other reaction(s): Other (See Comments) Pt doesn't take this vaccine as a precaution due to other allergens she has.   Wound Dressing Adhesive    Bee Venom Rash   Milk (cow) Nausea And Vomiting   Neomycin-bacitracin Zn-polymyx Rash        Medication List        Accurate as of February 02, 2023 10:14 AM. If you have any questions, ask your nurse or doctor.          albuterol 108 (90 Base) MCG/ACT inhaler Commonly known as: Ventolin HFA Inhale 2 puffs into the lungs every 6 (six) hours as needed for wheezing or shortness of breath.   alendronate 70 MG tablet Commonly known as: FOSAMAX Take 1 tablet (70 mg total) by mouth once a week.   amLODipine 5 MG tablet Commonly known as: NORVASC TAKE 1 TABLET (5 MG TOTAL) BY MOUTH DAILY.   Debrox 6.5 % OTIC solution Generic drug: carbamide peroxide Place 5 drops into the right ear 2 (two) times daily. Started by: Felix Pacini   EPINEPHrine 0.3 mg/0.3 mL Soaj injection Commonly known as: EpiPen 2-Pak Inject 0.3 mg into the muscle as needed for anaphylaxis.   KP Fish Oil 1200 MG Caps Take by mouth.   triamcinolone 0.025 % ointment Commonly known as: KENALOG Apply 1 Application topically 2 (two) times daily. Use for 2 weeks at a time for a flare.  Use at night two to three times a week for maintenance dosing.   VITAMIN D3 PO Take by mouth.   Vitamin K1 Powd by Does not apply route.        All past medical history, surgical history, allergies, family history, immunizations andmedications were updated in the EMR today and reviewed under the history and medication portions of their EMR.     ROS Negative, with the  exception of above mentioned in HPI   Objective:  BP 112/76   Pulse 60   Temp 98 F (36.7 C)   Wt 151  lb 12.8 oz (68.9 kg)   LMP 07/21/2008 (Approximate)   SpO2 97%   BMI 27.76 kg/m  Body mass index is 27.76 kg/m. Physical Exam Vitals and nursing note reviewed.  Constitutional:      General: She is not in acute distress.    Appearance: Normal appearance. She is normal weight. She is not ill-appearing or toxic-appearing.  HENT:     Head: Normocephalic and atraumatic.     Right Ear: External ear normal. There is impacted cerumen.     Left Ear: Tympanic membrane, ear canal and external ear normal.     Ears:     Comments: Lt mild tm effusion present.  Rt: cerumen impaction present. Unable to visualize TM.  Eyes:     General: No scleral icterus.       Right eye: No discharge.        Left eye: No discharge.     Extraocular Movements: Extraocular movements intact.     Conjunctiva/sclera: Conjunctivae normal.     Pupils: Pupils are equal, round, and reactive to light.  Pulmonary:     Effort: Pulmonary effort is normal. No respiratory distress.     Breath sounds: Normal breath sounds. No stridor. No wheezing, rhonchi or rales.  Chest:     Chest wall: No tenderness.  Musculoskeletal:     Cervical back: No tenderness.  Lymphadenopathy:     Cervical: No cervical adenopathy.  Skin:    Findings: No rash.  Neurological:     Mental Status: She is alert and oriented to person, place, and time. Mental status is at baseline.     Motor: No weakness.     Coordination: Coordination normal.     Gait: Gait normal.  Psychiatric:        Mood and Affect: Mood normal.        Behavior: Behavior normal.        Thought Content: Thought content normal.        Judgment: Judgment normal.    No results found. No results found. No results found for this or any previous visit (from the past 24 hour(s)).  Assessment/Plan: Alexandria Rush is a 72 y.o. female present for OV for  Right ear  pain/Impacted cerumen of right ear Cerumen present. Unable to visualize TM Procedure: Cerumen disimpaction Patient was verbally consented to procedure. Water-peroxide solution was applied and gentle ear lavage performed on right  ear(s).  There were no complications.  Tympanic membrane still blocked by cerumen after lavage, despite fair amount of removal.   Tympanic membrane(s) intact.  Auditory canal(s) moderate amount of cerumen removed, moderate remains.  Patient tolerated procedure well.  Patient reported relief of symptoms after removal of cerumen. Debrox 2x daily over next 1-2 weeks.  Follow uo in 1-2 weeks for recheck.    Reviewed expectations re: course of current medical issues. Discussed self-management of symptoms. Outlined signs and symptoms indicating need for more acute intervention. Patient verbalized understanding and all questions were answered. Patient received an After-Visit Summary.    No orders of the defined types were placed in this encounter.  Meds ordered this encounter  Medications   carbamide peroxide (DEBROX) 6.5 % OTIC solution    Sig: Place 5 drops into the right ear 2 (two) times daily.    Dispense:  15 mL    Refill:  1   Referral Orders  No referral(s) requested today     Note is dictated utilizing voice recognition software. Although note has been  proof read prior to signing, occasional typographical errors still can be missed. If any questions arise, please do not hesitate to call for verification.   electronically signed by:  Felix Pacini, DO  [facility] [place]

## 2023-02-02 NOTE — Patient Instructions (Addendum)
Return in 2 weeks (on 02/16/2023).        Great to see you today.  I have refilled the medication(s) we provide.   If labs were collected, we will inform you of lab results once received either by echart message or telephone call.   - echart message- for normal results that have been seen by the patient already.   - telephone call: abnormal results or if patient has not viewed results in their echart.

## 2023-02-03 ENCOUNTER — Telehealth: Payer: Self-pay | Admitting: Family Medicine

## 2023-02-03 NOTE — Telephone Encounter (Signed)
Spoke with patient regarding results/recommendations.  

## 2023-02-03 NOTE — Telephone Encounter (Signed)
Patient called needing clarification on the medication(Ear drops) she was prescribed yesterday for her ears.  Please give the patient a call back to discuss.

## 2023-02-12 ENCOUNTER — Ambulatory Visit: Payer: Medicare PPO | Admitting: Family Medicine

## 2023-02-12 ENCOUNTER — Encounter: Payer: Self-pay | Admitting: Family Medicine

## 2023-02-12 VITALS — BP 125/83 | HR 62 | Temp 98.1°F | Wt 151.2 lb

## 2023-02-12 DIAGNOSIS — H9201 Otalgia, right ear: Secondary | ICD-10-CM | POA: Diagnosis not present

## 2023-02-12 DIAGNOSIS — M810 Age-related osteoporosis without current pathological fracture: Secondary | ICD-10-CM | POA: Diagnosis not present

## 2023-02-12 DIAGNOSIS — H6121 Impacted cerumen, right ear: Secondary | ICD-10-CM

## 2023-02-12 DIAGNOSIS — I10 Essential (primary) hypertension: Secondary | ICD-10-CM

## 2023-02-12 MED ORDER — AMLODIPINE BESYLATE 5 MG PO TABS: 5.0000 mg | ORAL_TABLET | Freq: Every day | ORAL | 1 refills | Status: DC

## 2023-02-12 MED ORDER — CIPROFLOXACIN-DEXAMETHASONE 0.3-0.1 % OT SUSP: 4.0000 [drp] | Freq: Two times a day (BID) | OTIC | 0 refills | Status: DC

## 2023-02-12 MED ORDER — CIPROFLOXACIN-DEXAMETHASONE 0.3-0.1 % OT SUSP: 4.0000 [drp] | Freq: Two times a day (BID) | OTIC | 0 refills | Status: AC

## 2023-02-12 NOTE — Patient Instructions (Addendum)
We will see you in December for your physical.         Great to see you today.  I have refilled the medication(s) we provide.   If labs were collected, we will inform you of lab results once received either by echart message or telephone call.   - echart message- for normal results that have been seen by the patient already.   - telephone call: abnormal results or if patient has not viewed results in their echart.

## 2023-02-12 NOTE — Progress Notes (Signed)
Patient ID: Alexandria Rush, female  DOB: 02-Jun-1951, 72 y.o.   MRN: [id number] Patient Care Team    Relationship Specialty Notifications Start End  Natalia Leatherwood, DO PCP - General Family Medicine  08/20/18   Nyoka Cowden, MD Consulting Physician Pulmonary Disease  08/20/18   Janyth Contes, MD    08/20/18    Comment: thoracic surgeon  Vida Rigger, MD Consulting Physician Gastroenterology  08/20/18   Patton Salles, MD Consulting Physician Obstetrics and Gynecology  09/22/22     Chief Complaint  Patient presents with   Ear Pain    Slightly better but feels like it moves to the other ear And Chronic Conditions/illness Management     Subjective: Alexandria Rush is a 72 y.o.  Female  present for Chronic Conditions/illness Management All past medical history, surgical history, allergies, family history, immunizations, medications and social history were updated in the electronic medical record today. All recent labs, ED visits and hospitalizations within the last year were reviewed.  Hypertension:  Pt reports compliance with amlodipine 5 mg daily. Patient denies chest pain, shortness of breath, dizziness or lower extremity edema.  Pt does not take a daily baby ASA. Pt is is not prescribed statin. Diet: Low-sodium Exercise: Routine exercise.    Osteoporosis: DEXA 07/08/2021 resulted >osteoporosis (-2.9).  Patient also had elevated alkaline phosphatase on labs.  She supplements w/ vitamin D and calcium,  is taking Fosamax weekly for her osteoporosis.  She is tolerating the Fosamax. She has no nausea, vomiting, abdominal pain.  Right ear pain/cerumen Impaction: Pt was evaluated 2 weeks ago for right sided ear discomfort that had been present at least 4 weeks at the time of appointment.  Patient was found to have cerumen impaction of her right external auditory canal.  Ear lavage was performed with moderate amount of cerumen removal without complication.  A fair amount of  cerumen still remained in the proximal portion of her ear canal.  She was prescribed Debrox to use twice a day for 2 weeks and return for recheck today. Today she states it is some better but still painful.  Prior note: Pt presents for an OV with complaints of right ear pain of 4 weeks duration.  Associated symptoms include right side of face and neck discomfort. She endorses max sinus pressure and watery eyes. She noticed a sharp pain on plane descent.  She endorses nasal drainage, foul taste in mouth She took mucinex, Claritin and advil.  H/o right sided trigeminal neuralgia.     02/02/2023    9:36 AM 06/17/2022    9:33 AM 10/24/2021   10:00 AM 05/28/2021    9:52 AM 06/06/2020    8:22 AM  Depression screen PHQ 2/9  Decreased Interest 0 0 0 0 0  Down, Depressed, Hopeless 0 0 0 0 0  PHQ - 2 Score 0 0 0 0 0       No data to display           Immunization History  Administered Date(s) Administered   PFIZER(Purple Top)SARS-COV-2 Vaccination 09/16/2019, 10/12/2019, 04/23/2020   Tdap 08/21/2013   Past Medical History:  Diagnosis Date   Anemia    Asthma    Chicken pox    GERD (gastroesophageal reflux disease)    Hypertension    MVP (mitral valve prolapse)    Nasal septal deviation 12/09/2012   Rib fracture 08/27/2017   left 6-9 rib fx- seen of f/u  CT- xray was normal   Allergies  Allergen Reactions   Erythromycin Anaphylaxis   Influenza Vaccines Anaphylaxis   Penicillins Anaphylaxis   Phenylephrine Anaphylaxis   Tetracycline Anaphylaxis   Tropicamide Swelling   Benzalkonium Chloride Rash   Fosamax [Alendronate] Nausea Only    Worsened GERD and stomach upset.    Tilactase     Other reaction(s): Wheezing Other reaction(s): Wheezing    Lactose Intolerance (Gi) Swelling    wheezing   Peach [Prunus Persica] Swelling   Peanut Oil     Pt suspects   Pneumococcal Vaccine Other (See Comments)    Pt doesn't take this vaccine as a precaution due to other allergens she  has. Other reaction(s): Other (See Comments) Pt doesn't take this vaccine as a precaution due to other allergens she has.    Wound Dressing Adhesive    Bee Venom Rash   Milk (Cow) Nausea And Vomiting   Neomycin-Bacitracin Zn-Polymyx Rash   Past Surgical History:  Procedure Laterality Date   ADENOIDECTOMY     APPENDECTOMY     COLONOSCOPY  2019   HYSTEROSCOPY     NASAL SINUS SURGERY  2014   RIB FRACTURE SURGERY  2019   TONSILLECTOMY     Family History  Problem Relation Age of Onset   Stomach cancer Mother    Cancer Mother    Hypertension Father    Heart disease Father    Stroke Father    Diabetes Brother    Allergies Brother    Asthma Brother    Allergic rhinitis Brother    Diabetes Paternal Aunt    Hypertension Brother    Allergies Sister    Asthma Sister    Diabetes Sister    Allergic rhinitis Sister    Allergies Son    Allergies Daughter    Cancer Maternal Aunt    Stroke Maternal Uncle    Cancer Paternal Aunt        breast   Breast cancer Paternal Aunt    Cancer Maternal Uncle        pancreatic   Social History   Social History Narrative   Marital status/children/pets: married, 2 children. From [facility]   Education/employment: masters degree. Retired- Sr. Conservator, museum/gallery:      -smoke alarm in the home:Yes     - wears seatbelt: Yes     - Feels safe in their relationships: Yes    Allergies as of 02/12/2023       Reactions   Erythromycin Anaphylaxis   Influenza Vaccines Anaphylaxis   Penicillins Anaphylaxis   Phenylephrine Anaphylaxis   Tetracycline Anaphylaxis   Tropicamide Swelling   Benzalkonium Chloride Rash   Fosamax [alendronate] Nausea Only   Worsened GERD and stomach upset.    Tilactase    Other reaction(s): Wheezing Other reaction(s): Wheezing   Lactose Intolerance (gi) Swelling   wheezing   Peach [prunus Persica] Swelling   Peanut Oil    Pt suspects   Pneumococcal Vaccine Other (See Comments)   Pt doesn't take this vaccine as a  precaution due to other allergens she has. Other reaction(s): Other (See Comments) Pt doesn't take this vaccine as a precaution due to other allergens she has.   Wound Dressing Adhesive    Bee Venom Rash   Milk (cow) Nausea And Vomiting   Neomycin-bacitracin Zn-polymyx Rash        Medication List        Accurate as of February 12, 2023  8:16 AM.  If you have any questions, ask your nurse or doctor.          albuterol 108 (90 Base) MCG/ACT inhaler Commonly known as: Ventolin HFA Inhale 2 puffs into the lungs every 6 (six) hours as needed for wheezing or shortness of breath.   alendronate 70 MG tablet Commonly known as: FOSAMAX Take 1 tablet (70 mg total) by mouth once a week.   amLODipine 5 MG tablet Commonly known as: NORVASC Take 1 tablet (5 mg total) by mouth daily.   ciprofloxacin-dexamethasone OTIC suspension Commonly known as: Ciprodex Place 4 drops into the right ear 2 (two) times daily for 5 days. Started by: Felix Pacini   Debrox 6.5 % OTIC solution Generic drug: carbamide peroxide Place 5 drops into the right ear 2 (two) times daily.   EPINEPHrine 0.3 mg/0.3 mL Soaj injection Commonly known as: EpiPen 2-Pak Inject 0.3 mg into the muscle as needed for anaphylaxis.   KP Fish Oil 1200 MG Caps Take by mouth.   triamcinolone 0.025 % ointment Commonly known as: KENALOG Apply 1 Application topically 2 (two) times daily. Use for 2 weeks at a time for a flare.  Use at night two to three times a week for maintenance dosing.   VITAMIN D3 PO Take by mouth.   Vitamin K1 Powd by Does not apply route.        All past medical history, surgical history, allergies, family history, immunizations andmedications were updated in the EMR today and reviewed under the history and medication portions of their EMR.       ROS: 14 pt review of systems performed and negative (unless mentioned in an HPI)  Objective: BP 125/83   Pulse 62   Temp 98.1 F (36.7 C)   Wt 151  lb 3.2 oz (68.6 kg)   LMP 07/21/2008 (Approximate)   SpO2 95%   BMI 27.65 kg/m  Physical Exam Vitals and nursing note reviewed.  Constitutional:      General: She is not in acute distress.    Appearance: Normal appearance. She is not ill-appearing, toxic-appearing or diaphoretic.  HENT:     Head: Normocephalic and atraumatic.     Right Ear: There is impacted cerumen.     Left Ear: Tympanic membrane, ear canal and external ear normal. There is no impacted cerumen.     Ears:     Comments: Mild left effusion remains.  Eyes:     General: No scleral icterus.       Right eye: No discharge.        Left eye: No discharge.     Extraocular Movements: Extraocular movements intact.     Conjunctiva/sclera: Conjunctivae normal.     Pupils: Pupils are equal, round, and reactive to light.  Cardiovascular:     Rate and Rhythm: Normal rate and regular rhythm.  Pulmonary:     Effort: Pulmonary effort is normal. No respiratory distress.     Breath sounds: Normal breath sounds. No wheezing, rhonchi or rales.  Musculoskeletal:     Right lower leg: No edema.     Left lower leg: No edema.  Skin:    General: Skin is warm.     Findings: No rash.  Neurological:     Mental Status: She is alert and oriented to person, place, and time. Mental status is at baseline.     Motor: No weakness.     Gait: Gait normal.  Psychiatric:        Mood and Affect: Mood  normal.        Behavior: Behavior normal.        Thought Content: Thought content normal.        Judgment: Judgment normal.     No results found.  Assessment/plan: Alexandria Rush is a 72 y.o. female present for Chronic Conditions/illness Management Mild intermittent extrinsic asthma without status asthmaticus without complication/allergies Stable continue albuterol  continue epi pen prn   Benign essential HTN/overweight Stable Continue amlodipine 5 mg qd Low sodium heart health diet and exercise.    Osteoporosis: Dexa q 2 yrs. Due  06/2023 Continue vit d supplement . Continue fosamax.  Right ear pain/Impacted cerumen of right ear/ otitis externa Procedure: Cerumen disimpaction Patient was verbally consented to procedure. Water-peroxide solution was applied and gentle ear lavage performed on right  ear(s).  There were no complications.  Tympanic membrane was visualized after disimpaction.  Tympanic membrane(s) intact, without erythema.  Auditory canal(s) with wht exudative debris.  Patient tolerated procedure well.  Patient reported relief of symptoms after removal of cerumen. Ciprodex BID x5-7 days  Return for has her next appt scheduled. .  No orders of the defined types were placed in this encounter.  Meds ordered this encounter  Medications   amLODipine (NORVASC) 5 MG tablet    Sig: Take 1 tablet (5 mg total) by mouth daily.    Dispense:  90 tablet    Refill:  1   DISCONTD: ciprofloxacin-dexamethasone (CIPRODEX) OTIC suspension    Sig: Place 4 drops into the right ear 2 (two) times daily.    Dispense:  7.5 mL    Refill:  0   ciprofloxacin-dexamethasone (CIPRODEX) OTIC suspension    Sig: Place 4 drops into the right ear 2 (two) times daily for 5 days.    Dispense:  7.5 mL    Refill:  0    Referral Orders  No referral(s) requested today     Electronically signed by: Felix Pacini, DO [facility] [place]

## 2023-02-13 ENCOUNTER — Ambulatory Visit: Payer: Medicare PPO | Admitting: Family Medicine

## 2023-04-14 DIAGNOSIS — R42 Dizziness and giddiness: Secondary | ICD-10-CM | POA: Diagnosis not present

## 2023-04-14 DIAGNOSIS — H93291 Other abnormal auditory perceptions, right ear: Secondary | ICD-10-CM | POA: Diagnosis not present

## 2023-04-14 DIAGNOSIS — J3489 Other specified disorders of nose and nasal sinuses: Secondary | ICD-10-CM | POA: Diagnosis not present

## 2023-04-14 DIAGNOSIS — H9201 Otalgia, right ear: Secondary | ICD-10-CM | POA: Diagnosis not present

## 2023-04-22 DIAGNOSIS — R42 Dizziness and giddiness: Secondary | ICD-10-CM | POA: Diagnosis not present

## 2023-04-22 DIAGNOSIS — J3489 Other specified disorders of nose and nasal sinuses: Secondary | ICD-10-CM | POA: Diagnosis not present

## 2023-04-22 DIAGNOSIS — J329 Chronic sinusitis, unspecified: Secondary | ICD-10-CM | POA: Diagnosis not present

## 2023-04-22 DIAGNOSIS — H9201 Otalgia, right ear: Secondary | ICD-10-CM | POA: Diagnosis not present

## 2023-04-22 DIAGNOSIS — H93291 Other abnormal auditory perceptions, right ear: Secondary | ICD-10-CM | POA: Diagnosis not present

## 2023-04-23 DIAGNOSIS — H903 Sensorineural hearing loss, bilateral: Secondary | ICD-10-CM | POA: Diagnosis not present

## 2023-04-23 DIAGNOSIS — R42 Dizziness and giddiness: Secondary | ICD-10-CM | POA: Diagnosis not present

## 2023-04-24 ENCOUNTER — Other Ambulatory Visit: Payer: Self-pay | Admitting: Otolaryngology

## 2023-04-24 DIAGNOSIS — H918X3 Other specified hearing loss, bilateral: Secondary | ICD-10-CM

## 2023-05-09 ENCOUNTER — Other Ambulatory Visit: Payer: Self-pay | Admitting: Family Medicine

## 2023-05-11 ENCOUNTER — Ambulatory Visit
Admission: RE | Admit: 2023-05-11 | Discharge: 2023-05-11 | Disposition: A | Discharge status: Home / Self Care | Payer: Medicare PPO | Source: Ambulatory Visit | Attending: Otolaryngology | Admitting: Otolaryngology

## 2023-05-11 DIAGNOSIS — H918X3 Other specified hearing loss, bilateral: Secondary | ICD-10-CM

## 2023-05-11 DIAGNOSIS — H9041 Sensorineural hearing loss, unilateral, right ear, with unrestricted hearing on the contralateral side: Secondary | ICD-10-CM | POA: Diagnosis not present

## 2023-05-11 MED ORDER — GADOPICLENOL 0.5 MMOL/ML IV SOLN
7.5000 mL | Freq: Once | INTRAVENOUS | Status: AC | PRN
Start: 2023-05-11 — End: 2023-05-11
  Administered 2023-05-11: 7 mL via INTRAVENOUS

## 2023-05-27 ENCOUNTER — Telehealth: Payer: Self-pay

## 2023-05-27 NOTE — Telephone Encounter (Signed)
Pt dropped off medical monitoring form to be completed. She scheduled an appt for 11/15. Form placed up front in Kuneff's basket.

## 2023-06-03 DIAGNOSIS — H903 Sensorineural hearing loss, bilateral: Secondary | ICD-10-CM | POA: Diagnosis not present

## 2023-06-05 ENCOUNTER — Encounter: Payer: Self-pay | Admitting: Family Medicine

## 2023-06-05 ENCOUNTER — Ambulatory Visit (INDEPENDENT_AMBULATORY_CARE_PROVIDER_SITE_OTHER): Payer: Medicare PPO | Admitting: Family Medicine

## 2023-06-05 VITALS — BP 138/100 | HR 67 | Temp 77.0°F | Wt 154.0 lb

## 2023-06-05 DIAGNOSIS — Z77018 Contact with and (suspected) exposure to other hazardous metals: Secondary | ICD-10-CM | POA: Diagnosis not present

## 2023-06-05 DIAGNOSIS — Z1389 Encounter for screening for other disorder: Secondary | ICD-10-CM

## 2023-06-05 DIAGNOSIS — Z130 Encounter for screening for diseases of the blood and blood-forming organs and certain disorders involving the immune mechanism: Secondary | ICD-10-CM

## 2023-06-05 NOTE — Patient Instructions (Signed)
Return in about 2 years (around 06/04/2025) for perrine monitoring .        Great to see you today.  I have refilled the medication(s) we provide.   If labs were collected or images ordered, we will inform you of  results once we have received them and reviewed. We will contact you either by echart message, or telephone call.  Please give ample time to the testing facility, and our office to run,  receive and review results. Please do not call inquiring of results, even if you can see them in your chart. We will contact you as soon as we are able. If it has been over 1 week since the test was completed, and you have not yet heard from [place], then please call us.    - echart message- for normal results that have been seen by the patient already.   - telephone call: abnormal results or if patient has not viewed results in their echart.  If a referral to a specialist was entered for you, please call us in 2 weeks if you have not heard from the specialist office to schedule.

## 2023-06-05 NOTE — Progress Notes (Unsigned)
Alexandria Rush , 1950-12-19, 72 y.o., female MRN: [id number] Patient Care Team    Relationship Specialty Notifications Start End  Natalia Leatherwood, DO PCP - General Family Medicine  08/20/18   Nyoka Cowden, MD Consulting Physician Pulmonary Disease  08/20/18   Janyth Contes, MD    08/20/18    Comment: thoracic surgeon  Vida Rigger, MD Consulting Physician Gastroenterology  08/20/18   Patton Salles, MD Consulting Physician Obstetrics and Gynecology  09/22/22     Chief Complaint  Patient presents with   Chemical Exposure    Contact/exposure heavy metals.  [facility] Monitoring Plan q 2 yr monitoring appt     Subjective: Alexandria Rush is a 72 y.o. Pt presents for an OV with complaints of *** of *** duration.  Associated symptoms include ***.  Pt has tried *** to ease their symptoms.      02/02/2023    9:36 AM 06/17/2022    9:33 AM 10/24/2021   10:00 AM 05/28/2021    9:52 AM 06/06/2020    8:22 AM  Depression screen PHQ 2/9  Decreased Interest 0 0 0 0 0  Down, Depressed, Hopeless 0 0 0 0 0  PHQ - 2 Score 0 0 0 0 0    Allergies  Allergen Reactions   Erythromycin Anaphylaxis   Influenza Vaccines Anaphylaxis   Penicillins Anaphylaxis   Phenylephrine Anaphylaxis   Tetracycline Anaphylaxis   Tropicamide Swelling   Benzalkonium Chloride Rash   Fosamax [Alendronate] Nausea Only    Worsened GERD and stomach upset.    Tilactase     Other reaction(s): Wheezing Other reaction(s): Wheezing    Lactose Intolerance (Gi) Swelling    wheezing   Peach [Prunus Persica] Swelling   Peanut Oil     Pt suspects   Pneumococcal Vaccine Other (See Comments)    Pt doesn't take this vaccine as a precaution due to other allergens she has. Other reaction(s): Other (See Comments) Pt doesn't take this vaccine as a precaution due to other allergens she has.    Wound Dressing Adhesive    Bee Venom Rash   Milk (Cow) Nausea And Vomiting   Neomycin-Bacitracin Zn-Polymyx  Rash   Social History   Social History Narrative   Marital status/children/pets: married, 2 children. From [facility]   Education/employment: masters degree. Retired- Sr. Conservator, museum/gallery:      -smoke alarm in the home:Yes     - wears seatbelt: Yes     - Feels safe in their relationships: Yes   Past Medical History:  Diagnosis Date   Anemia    Asthma    Chicken pox    GERD (gastroesophageal reflux disease)    Hypertension    MVP (mitral valve prolapse)    Nasal septal deviation 12/09/2012   Rib fracture 08/27/2017   left 6-9 rib fx- seen of f/u CT- xray was normal   Past Surgical History:  Procedure Laterality Date   ADENOIDECTOMY     APPENDECTOMY     COLONOSCOPY  2019   HYSTEROSCOPY     NASAL SINUS SURGERY  2014   RIB FRACTURE SURGERY  2019   TONSILLECTOMY     Family History  Problem Relation Age of Onset   Stomach cancer Mother    Cancer Mother    Hypertension Father    Heart disease Father    Stroke Father    Diabetes Brother    Allergies  Brother    Asthma Brother    Allergic rhinitis Brother    Diabetes Paternal Aunt    Hypertension Brother    Allergies Sister    Asthma Sister    Diabetes Sister    Allergic rhinitis Sister    Allergies Son    Allergies Daughter    Cancer Maternal Aunt    Stroke Maternal Uncle    Cancer Paternal Aunt        breast   Breast cancer Paternal Aunt    Cancer Maternal Uncle        pancreatic   Allergies as of 06/05/2023       Reactions   Erythromycin Anaphylaxis   Influenza Vaccines Anaphylaxis   Penicillins Anaphylaxis   Phenylephrine Anaphylaxis   Tetracycline Anaphylaxis   Tropicamide Swelling   Benzalkonium Chloride Rash   Fosamax [alendronate] Nausea Only   Worsened GERD and stomach upset.    Tilactase    Other reaction(s): Wheezing Other reaction(s): Wheezing   Lactose Intolerance (gi) Swelling   wheezing   Peach [prunus Persica] Swelling   Peanut Oil    Pt suspects   Pneumococcal Vaccine Other (See  Comments)   Pt doesn't take this vaccine as a precaution due to other allergens she has. Other reaction(s): Other (See Comments) Pt doesn't take this vaccine as a precaution due to other allergens she has.   Wound Dressing Adhesive    Bee Venom Rash   Milk (cow) Nausea And Vomiting   Neomycin-bacitracin Zn-polymyx Rash        Medication List        Accurate as of June 05, 2023 10:51 AM. If you have any questions, ask your nurse or doctor.          albuterol 108 (90 Base) MCG/ACT inhaler Commonly known as: Ventolin HFA Inhale 2 puffs into the lungs every 6 (six) hours as needed for wheezing or shortness of breath.   alendronate 70 MG tablet Commonly known as: FOSAMAX TAKE 1 TABLET (70 MG TOTAL) BY MOUTH ONCE A WEEK   amLODipine 5 MG tablet Commonly known as: NORVASC Take 1 tablet (5 mg total) by mouth daily.   Debrox 6.5 % OTIC solution Generic drug: carbamide peroxide Place 5 drops into the right ear 2 (two) times daily.   EPINEPHrine 0.3 mg/0.3 mL Soaj injection Commonly known as: EpiPen 2-Pak Inject 0.3 mg into the muscle as needed for anaphylaxis.   KP Fish Oil 1200 MG Caps Take by mouth.   triamcinolone 0.025 % ointment Commonly known as: KENALOG Apply 1 Application topically 2 (two) times daily. Use for 2 weeks at a time for a flare.  Use at night two to three times a week for maintenance dosing.   VITAMIN D3 PO Take by mouth.   Vitamin K1 Powd by Does not apply route.        All past medical history, surgical history, allergies, family history, immunizations andmedications were updated in the EMR today and reviewed under the history and medication portions of their EMR.     ROS Negative, with the exception of above mentioned in HPI   Objective:  BP (!) 138/100   Pulse 67   Temp (!) 77 F (25 C)   Wt 154 lb (69.9 kg)   LMP 07/21/2008 (Approximate)   SpO2 98%   BMI 28.17 kg/m  Body mass index is 28.17 kg/m. Physical Exam   No  results found. No results found. No results found for this or any  previous visit (from the past 24 hour(s)).  Assessment/Plan: Alexandria Rush is a 72 y.o. female present for OV for  *** Reviewed expectations re: course of current medical issues. Discussed self-management of symptoms. Outlined signs and symptoms indicating need for more acute intervention. Patient verbalized understanding and all questions were answered. Patient received an After-Visit Summary.    Orders Placed This Encounter  Procedures   Comp Met (CMET)   Calcium, urine, random   CBC w/Diff   Lead, blood (adult age 20 yrs or greater)   Uric acid   Urinalysis, Routine w reflex microscopic   Urine Microalbumin w/creat. ratio   POC Hemoccult Bld/Stl (3-Cd Home Screen)   No orders of the defined types were placed in this encounter.  Referral Orders  No referral(s) requested today     Note is dictated utilizing voice recognition software. Although note has been proof read prior to signing, occasional typographical errors still can be missed. If any questions arise, please do not hesitate to call for verification.   electronically signed by:  Felix Pacini, DO  [facility] [place]

## 2023-06-08 ENCOUNTER — Telehealth: Payer: Self-pay | Admitting: Family Medicine

## 2023-06-08 DIAGNOSIS — R718 Other abnormality of red blood cells: Secondary | ICD-10-CM

## 2023-06-08 DIAGNOSIS — R748 Abnormal levels of other serum enzymes: Secondary | ICD-10-CM

## 2023-06-08 DIAGNOSIS — R82998 Other abnormal findings in urine: Secondary | ICD-10-CM | POA: Insufficient documentation

## 2023-06-08 HISTORY — DX: Other abnormality of red blood cells: R71.8

## 2023-06-08 NOTE — Telephone Encounter (Signed)
Please inform patient the results of her labs Liver and kidney functions are normal. She has a very mildly elevated alk phos, this has been very mildly elevated in the past and returned to normal on its own.  This can be caused by disorders of vitamin D/bone, gallstones, medications and supplements.  Since she is not symptomatic and this is just mildly elevated, this is not concerning at this level. Urine calcium is within normal limits Urine protein levels are normal. She does have a small amount of red blood cells in her urine, but she also has crystals in her urine.-Crystals in the urine can cause traces of blood in the urine, especially if they are making small kidney stones.  Blood cell counts are normal, MCV/size of red blood cells are mildly elevated-there are many possible causes to elevated MCV, most common be thyroid disorder or B vitamins deficiency.  Lead levels are pending.   I would recommend follow-up on the abnormal findings above.  I have recommended follow-up in about 2-4 weeks, we will repeat her urine at that time and check her thyroid, B vitamins.

## 2023-06-09 LAB — URINALYSIS, ROUTINE W REFLEX MICROSCOPIC
Bilirubin, UA: NEGATIVE
Glucose, UA: NEGATIVE
Ketones, UA: NEGATIVE
Nitrite, UA: NEGATIVE
Protein,UA: NEGATIVE
RBC, UA: NEGATIVE
Specific Gravity, UA: 1.02 (ref 1.005–1.030)
Urobilinogen, Ur: 0.2 mg/dL (ref 0.2–1.0)
pH, UA: 6.5 (ref 5.0–7.5)

## 2023-06-09 LAB — CBC WITH DIFFERENTIAL/PLATELET
Basophils Absolute: 0 10*3/uL (ref 0.0–0.2)
Basos: 1 %
EOS (ABSOLUTE): 0.1 10*3/uL (ref 0.0–0.4)
Eos: 2 %
Hematocrit: 43.3 % (ref 34.0–46.6)
Hemoglobin: 13.8 g/dL (ref 11.1–15.9)
Immature Grans (Abs): 0 10*3/uL (ref 0.0–0.1)
Immature Granulocytes: 0 %
Lymphocytes Absolute: 2.4 10*3/uL (ref 0.7–3.1)
Lymphs: 37 %
MCH: 32 pg (ref 26.6–33.0)
MCHC: 31.9 g/dL (ref 31.5–35.7)
MCV: 101 fL — ABNORMAL HIGH (ref 79–97)
Monocytes Absolute: 0.6 10*3/uL (ref 0.1–0.9)
Monocytes: 9 %
Neutrophils Absolute: 3.3 10*3/uL (ref 1.4–7.0)
Neutrophils: 51 %
Platelets: 280 10*3/uL (ref 150–450)
RBC: 4.31 x10E6/uL (ref 3.77–5.28)
RDW: 13 % (ref 11.7–15.4)
WBC: 6.4 10*3/uL (ref 3.4–10.8)

## 2023-06-09 LAB — COMPREHENSIVE METABOLIC PANEL
ALT: 12 IU/L (ref 0–32)
AST: 13 IU/L (ref 0–40)
Albumin: 4.7 g/dL (ref 3.8–4.8)
Alkaline Phosphatase: 127 IU/L — ABNORMAL HIGH (ref 44–121)
BUN/Creatinine Ratio: 13 (ref 12–28)
BUN: 11 mg/dL (ref 8–27)
Bilirubin Total: 0.3 mg/dL (ref 0.0–1.2)
CO2: 26 mmol/L (ref 20–29)
Calcium: 9.7 mg/dL (ref 8.7–10.3)
Chloride: 104 mmol/L (ref 96–106)
Creatinine, Ser: 0.84 mg/dL (ref 0.57–1.00)
Globulin, Total: 2.7 g/dL (ref 1.5–4.5)
Glucose: 79 mg/dL (ref 70–99)
Potassium: 4.8 mmol/L (ref 3.5–5.2)
Sodium: 144 mmol/L (ref 134–144)
Total Protein: 7.4 g/dL (ref 6.0–8.5)
eGFR: 74 mL/min/{1.73_m2} (ref >59)

## 2023-06-09 LAB — MICROSCOPIC EXAMINATION
Bacteria, UA: NONE SEEN
Casts: NONE SEEN /LPF

## 2023-06-09 LAB — LEAD, BLOOD (ADULT >= 16 YRS): Lead-Whole Blood: <1 ug/dL (ref 0.0–3.4)

## 2023-06-09 LAB — URIC ACID: Uric Acid: 3.4 mg/dL (ref 3.1–7.9)

## 2023-06-09 LAB — MICROALBUMIN / CREATININE URINE RATIO
Creatinine, Urine: 154.2 mg/dL
Microalb/Creat Ratio: 13 mg/g{creat} (ref 0–29)
Microalbumin, Urine: 19.4 ug/mL

## 2023-06-09 LAB — CALCIUM, URINE, RANDOM: Calcium, Urine: 14.3 mg/dL

## 2023-06-16 ENCOUNTER — Other Ambulatory Visit: Payer: Medicare PPO

## 2023-06-16 DIAGNOSIS — Z130 Encounter for screening for diseases of the blood and blood-forming organs and certain disorders involving the immune mechanism: Secondary | ICD-10-CM

## 2023-06-16 DIAGNOSIS — Z77018 Contact with and (suspected) exposure to other hazardous metals: Secondary | ICD-10-CM

## 2023-06-23 ENCOUNTER — Ambulatory Visit (INDEPENDENT_AMBULATORY_CARE_PROVIDER_SITE_OTHER): Payer: Medicare PPO | Admitting: Family Medicine

## 2023-06-23 ENCOUNTER — Encounter: Payer: Self-pay | Admitting: Family Medicine

## 2023-06-23 VITALS — BP 130/80 | HR 62 | Temp 97.6°F | Ht 62.21 in | Wt 154.0 lb

## 2023-06-23 DIAGNOSIS — Z131 Encounter for screening for diabetes mellitus: Secondary | ICD-10-CM | POA: Diagnosis not present

## 2023-06-23 DIAGNOSIS — R3129 Other microscopic hematuria: Secondary | ICD-10-CM | POA: Diagnosis not present

## 2023-06-23 DIAGNOSIS — R718 Other abnormality of red blood cells: Secondary | ICD-10-CM

## 2023-06-23 DIAGNOSIS — R82998 Other abnormal findings in urine: Secondary | ICD-10-CM | POA: Diagnosis not present

## 2023-06-23 DIAGNOSIS — M810 Age-related osteoporosis without current pathological fracture: Secondary | ICD-10-CM

## 2023-06-23 DIAGNOSIS — E663 Overweight: Secondary | ICD-10-CM | POA: Diagnosis not present

## 2023-06-23 DIAGNOSIS — I10 Essential (primary) hypertension: Secondary | ICD-10-CM | POA: Diagnosis not present

## 2023-06-23 DIAGNOSIS — Z1211 Encounter for screening for malignant neoplasm of colon: Secondary | ICD-10-CM

## 2023-06-23 DIAGNOSIS — J4520 Mild intermittent asthma, uncomplicated: Secondary | ICD-10-CM

## 2023-06-23 DIAGNOSIS — Z0000 Encounter for general adult medical examination without abnormal findings: Secondary | ICD-10-CM | POA: Diagnosis not present

## 2023-06-23 DIAGNOSIS — Z23 Encounter for immunization: Secondary | ICD-10-CM

## 2023-06-23 DIAGNOSIS — R748 Abnormal levels of other serum enzymes: Secondary | ICD-10-CM

## 2023-06-23 DIAGNOSIS — Z1231 Encounter for screening mammogram for malignant neoplasm of breast: Secondary | ICD-10-CM

## 2023-06-23 LAB — COMPREHENSIVE METABOLIC PANEL
ALT: 13 U/L (ref 0–35)
AST: 14 U/L (ref 0–37)
Albumin: 4.3 g/dL (ref 3.5–5.2)
Alkaline Phosphatase: 104 U/L (ref 39–117)
BUN: 16 mg/dL (ref 6–23)
CO2: 32 meq/L (ref 19–32)
Calcium: 9.3 mg/dL (ref 8.4–10.5)
Chloride: 105 meq/L (ref 96–112)
Creatinine, Ser: 0.82 mg/dL (ref 0.40–1.20)
GFR: 72.05 mL/min (ref >60.00)
Glucose, Bld: 90 mg/dL (ref 70–99)
Potassium: 4.7 meq/L (ref 3.5–5.1)
Sodium: 142 meq/L (ref 135–145)
Total Bilirubin: 0.5 mg/dL (ref 0.2–1.2)
Total Protein: 6.8 g/dL (ref 6.0–8.3)

## 2023-06-23 LAB — LIPID PANEL
Cholesterol: 225 mg/dL — ABNORMAL HIGH (ref 0–200)
HDL: 50.5 mg/dL (ref >39.00)
LDL Cholesterol: 151 mg/dL — ABNORMAL HIGH (ref 0–99)
NonHDL: 174.49
Total CHOL/HDL Ratio: 4
Triglycerides: 118 mg/dL (ref 0.0–149.0)
VLDL: 23.6 mg/dL (ref 0.0–40.0)

## 2023-06-23 LAB — T3, FREE: T3, Free: 3.4 pg/mL (ref 2.3–4.2)

## 2023-06-23 LAB — B12 AND FOLATE PANEL
Folate: 9.1 ng/mL (ref >5.9)
Vitamin B-12: 314 pg/mL (ref 211–911)

## 2023-06-23 LAB — VITAMIN D 25 HYDROXY (VIT D DEFICIENCY, FRACTURES): VITD: 65.95 ng/mL (ref 30.00–100.00)

## 2023-06-23 LAB — HEMOGLOBIN A1C: Hgb A1c MFr Bld: 6 % (ref 4.6–6.5)

## 2023-06-23 LAB — T4, FREE: Free T4: 0.85 ng/dL (ref 0.60–1.60)

## 2023-06-23 LAB — TSH: TSH: 2.1 u[IU]/mL (ref 0.35–5.50)

## 2023-06-23 MED ORDER — TETANUS-DIPHTH-ACELL PERTUSSIS 5-2.5-18.5 LF-MCG/0.5 IM SUSP: 0.5000 mL | Freq: Once | INTRAMUSCULAR | 0 refills | Status: AC

## 2023-06-23 MED ORDER — AMLODIPINE BESYLATE 5 MG PO TABS: 5.0000 mg | ORAL_TABLET | Freq: Every day | ORAL | 1 refills | Status: DC

## 2023-06-23 MED ORDER — ALENDRONATE SODIUM 70 MG PO TABS: 70.0000 mg | ORAL_TABLET | ORAL | 11 refills | Status: DC

## 2023-06-23 MED ORDER — ALBUTEROL SULFATE HFA 108 (90 BASE) MCG/ACT IN AERS: 2.0000 | INHALATION_SPRAY | Freq: Four times a day (QID) | RESPIRATORY_TRACT | 11 refills | Status: DC | PRN

## 2023-06-23 NOTE — Progress Notes (Signed)
Patient ID: Alexandria Rush, female  DOB: January 02, 1951, 72 y.o.   MRN: [id number] Patient Care Team    Relationship Specialty Notifications Start End  Natalia Leatherwood, DO PCP - General Family Medicine  08/20/18   Nyoka Cowden, MD Consulting Physician Pulmonary Disease  08/20/18   Janyth Contes, MD    08/20/18    Comment: thoracic surgeon  Vida Rigger, MD Consulting Physician Gastroenterology  08/20/18   Patton Salles, MD Consulting Physician Obstetrics and Gynecology  09/22/22     Chief Complaint  Patient presents with   Annual Exam    Pt is fasting;cmc    Subjective: CHAMPALE Rush is a 72 y.o.  Female  present for CPE and Chronic Conditions/illness Management  . All past medical history, surgical history, allergies, family history, immunizations, medications and social history were updated in the electronic medical record today. All recent labs, ED visits and hospitalizations within the last year were reviewed.  Health maintenance:  Colonoscopy: completed 12/2022, by [facility], f/u - 5years Mammogram: completed:07/2022, >> M[facility]P Immunizations: tdap UTD 2015- printed for jan. 2025, Influenza declined(encouraged yearly), PNA series declined, shingrix allergy- declined  Infectious disease screening: Hep C completed.  DEXA: 06/2021- M[facility]P> 2 yr follow up> ordered Assistive device: none Oxygen WUJ:WJXB Patient has a Dental home. Hospitalizations/ED visits: reviewed        No data to display           Immunization History  Administered Date(s) Administered   PFIZER(Purple Top)SARS-COV-2 Vaccination 09/16/2019, 10/12/2019, 04/23/2020   Tdap 08/21/2013   Past Medical History:  Diagnosis Date   Anemia    Asthma    Chicken pox    GERD (gastroesophageal reflux disease)    Hypertension    MVP (mitral valve prolapse)    Nasal septal deviation 12/09/2012   Rib fracture 08/27/2017   left 6-9 rib fx- seen of f/u CT- xray was normal   Allergies  Allergen  Reactions   Erythromycin Anaphylaxis   Influenza Vaccines Anaphylaxis   Penicillins Anaphylaxis   Phenylephrine Anaphylaxis   Tetracycline Anaphylaxis   Tropicamide Swelling   Benzalkonium Chloride Rash   Fosamax [Alendronate] Nausea Only    Worsened GERD and stomach upset.    Tilactase     Other reaction(s): Wheezing Other reaction(s): Wheezing    Lactose Intolerance (Gi) Swelling    wheezing   Peach [Prunus Persica] Swelling   Peanut Oil     Pt suspects   Pneumococcal Vaccine Other (See Comments)    Pt doesn't take this vaccine as a precaution due to other allergens she has. Other reaction(s): Other (See Comments) Pt doesn't take this vaccine as a precaution due to other allergens she has.    Wound Dressing Adhesive    Bee Venom Rash   Milk (Cow) Nausea And Vomiting   Neomycin-Bacitracin Zn-Polymyx Rash   Past Surgical History:  Procedure Laterality Date   ADENOIDECTOMY     APPENDECTOMY     COLONOSCOPY  2019   HYSTEROSCOPY     NASAL SINUS SURGERY  2014   RIB FRACTURE SURGERY  2019   TONSILLECTOMY     Family History  Problem Relation Age of Onset   Stomach cancer Mother    Cancer Mother    Hypertension Father    Heart disease Father    Stroke Father    Diabetes Brother    Allergies Brother    Asthma Brother    Allergic rhinitis Brother  Diabetes Paternal Aunt    Hypertension Brother    Allergies Sister    Asthma Sister    Diabetes Sister    Allergic rhinitis Sister    Allergies Son    Allergies Daughter    Cancer Maternal Aunt    Stroke Maternal Uncle    Cancer Paternal Aunt        breast   Breast cancer Paternal Aunt    Cancer Maternal Uncle        pancreatic   Social History   Social History Narrative   Marital status/children/pets: married, 2 children. From [facility]   Education/employment: masters degree. Retired- Sr. Conservator, museum/gallery:      -smoke alarm in the home:Yes     - wears seatbelt: Yes     - Feels safe in their relationships:  Yes    Allergies as of 06/23/2023       Reactions   Erythromycin Anaphylaxis   Influenza Vaccines Anaphylaxis   Penicillins Anaphylaxis   Phenylephrine Anaphylaxis   Tetracycline Anaphylaxis   Tropicamide Swelling   Benzalkonium Chloride Rash   Fosamax [alendronate] Nausea Only   Worsened GERD and stomach upset.    Tilactase    Other reaction(s): Wheezing Other reaction(s): Wheezing   Lactose Intolerance (gi) Swelling   wheezing   Peach [prunus Persica] Swelling   Peanut Oil    Pt suspects   Pneumococcal Vaccine Other (See Comments)   Pt doesn't take this vaccine as a precaution due to other allergens she has. Other reaction(s): Other (See Comments) Pt doesn't take this vaccine as a precaution due to other allergens she has.   Wound Dressing Adhesive    Bee Venom Rash   Milk (cow) Nausea And Vomiting   Neomycin-bacitracin Zn-polymyx Rash        Medication List        Accurate as of June 23, 2023 11:59 PM. If you have any questions, ask your nurse or doctor.          albuterol 108 (90 Base) MCG/ACT inhaler Commonly known as: Ventolin HFA Inhale 2 puffs into the lungs every 6 (six) hours as needed for wheezing or shortness of breath.   alendronate 70 MG tablet Commonly known as: FOSAMAX Take 1 tablet (70 mg total) by mouth once a week.   amLODipine 5 MG tablet Commonly known as: NORVASC Take 1 tablet (5 mg total) by mouth daily.   Debrox 6.5 % OTIC solution Generic drug: carbamide peroxide Place 5 drops into the right ear 2 (two) times daily.   EPINEPHrine 0.3 mg/0.3 mL Soaj injection Commonly known as: EpiPen 2-Pak Inject 0.3 mg into the muscle as needed for anaphylaxis.   KP Fish Oil 1200 MG Caps Take by mouth.   Tdap 5-2.5-18.5 LF-MCG/0.5 injection Commonly known as: BOOSTRIX Inject 0.5 mLs into the muscle once for 1 dose. Started by: Felix Pacini   triamcinolone 0.025 % ointment Commonly known as: KENALOG Apply 1 Application topically 2  (two) times daily. Use for 2 weeks at a time for a flare.  Use at night two to three times a week for maintenance dosing.   VITAMIN D3 PO Take by mouth.   Vitamin K1 Powd by Does not apply route.        All past medical history, surgical history, allergies, family history, immunizations andmedications were updated in the EMR today and reviewed under the history and medication portions of their EMR.       ROS: 14 pt  review of systems performed and negative (unless mentioned in an HPI)  Objective: BP 130/80   Pulse 62   Temp 97.6 F (36.4 C)   Ht 5' 2.21" (1.58 m)   Wt 154 lb (69.9 kg)   LMP 07/21/2008 (Approximate)   SpO2 96%   BMI 27.98 kg/m  Physical Exam Vitals and nursing note reviewed.  Constitutional:      General: She is not in acute distress.    Appearance: Normal appearance. She is not ill-appearing or toxic-appearing.  HENT:     Head: Normocephalic and atraumatic.     Right Ear: Ear canal and external ear normal. There is no impacted cerumen.     Left Ear: Tympanic membrane, ear canal and external ear normal. There is no impacted cerumen.     Ears:     Comments: Small area of scaliness/dryness appearing around 8 o'clock position on the tympanic membrane and extended into the canal at this area.    Nose: No congestion or rhinorrhea.     Mouth/Throat:     Mouth: Mucous membranes are moist.     Pharynx: Oropharynx is clear. No oropharyngeal exudate or posterior oropharyngeal erythema.  Eyes:     General: No scleral icterus.       Right eye: No discharge.        Left eye: No discharge.     Extraocular Movements: Extraocular movements intact.     Conjunctiva/sclera: Conjunctivae normal.     Pupils: Pupils are equal, round, and reactive to light.  Cardiovascular:     Rate and Rhythm: Normal rate and regular rhythm.     Pulses: Normal pulses.     Heart sounds: Normal heart sounds. No murmur heard.    No friction rub. No gallop.  Pulmonary:     Effort:  Pulmonary effort is normal. No respiratory distress.     Breath sounds: Normal breath sounds. No stridor. No wheezing, rhonchi or rales.  Chest:     Chest wall: No tenderness.  Abdominal:     General: Abdomen is flat. Bowel sounds are normal. There is no distension.     Palpations: Abdomen is soft. There is no mass.     Tenderness: There is no abdominal tenderness. There is no right CVA tenderness, left CVA tenderness, guarding or rebound.     Hernia: No hernia is present.  Musculoskeletal:        General: No swelling, tenderness or deformity. Normal range of motion.     Cervical back: Normal range of motion and neck supple. No rigidity or tenderness.     Right lower leg: No edema.     Left lower leg: No edema.  Lymphadenopathy:     Cervical: No cervical adenopathy.  Skin:    General: Skin is warm and dry.     Coloration: Skin is not jaundiced or pale.     Findings: No bruising, erythema, lesion or rash.  Neurological:     General: No focal deficit present.     Mental Status: She is alert and oriented to person, place, and time. Mental status is at baseline.     Cranial Nerves: No cranial nerve deficit.     Sensory: No sensory deficit.     Motor: No weakness.     Coordination: Coordination normal.     Gait: Gait normal.     Deep Tendon Reflexes: Reflexes normal.  Psychiatric:        Mood and Affect: Mood normal.  Behavior: Behavior normal.        Thought Content: Thought content normal.        Judgment: Judgment normal.     No results found.  Assessment/plan: TELETHIA STROZIER is a 72 y.o. female present for CPE Benign essential HTN/(BMI 25.0-29.9) Stable Continue amlodipine 5 mg every day- refilled - Comprehensive metabolic panel - Hemoglobin A1c - TSH - Lipid panel - T4, free - T3, free  Osteoporosis without current pathological fracture, unspecified osteoporosis type Vit d collected today Dexa due 06/2023> ordered  Mild intermittent extrinsic asthma  without status asthmaticus without complication Stable Continue albuterol prn- refilled  microscopic hematuria/Calcium oxalate crystals in urine - Urinalysis, Routine w reflex microscopic  Elevated alkaline phosphatase level - new problem - Comprehensive metabolic panel - PTH, Intact and Calcium - Vitamin D (25 hydroxy)  Elevated MCV - new problem - TSH - Lipid panel - PTH, Intact and Calcium - B12 and Folate Panel - Vitamin B1 - Methylmalonic acid, serum - T4, free - T3, free Breast cancer screening by mammogram - MM 3D SCREENING MAMMOGRAM BILATERAL BREAST; Future Colon cancer screening 5 yr f/u due 2029 Diabetes mellitus screening - Hemoglobin A1c Need for Tdap vaccination printed  Routine general medical examination at a health care facility Patient was encouraged to exercise greater than 150 minutes a week. Patient was encouraged to choose a diet filled with fresh fruits and vegetables, and lean meats. AVS provided to patient today for education/recommendation on gender specific health and safety maintenance. Colonoscopy: completed 12/2022, by [facility], f/u - 5years Mammogram: completed:07/2022, >> MCHP Immunizations: tdap UTD 2015- printed for jan. 2025, Influenza declined(encouraged yearly), PNA series declined, shingrix allergy- declined  Infectious disease screening: Hep C completed.  DEXA: 06/2021- MCHP> 2 yr follow up> ordered  Return in about 24 weeks (around 12/08/2023) for Routine chronic condition follow-up.  Orders Placed This Encounter  Procedures   DG Bone Density   MM 3D SCREENING MAMMOGRAM BILATERAL BREAST   Comprehensive metabolic panel   Hemoglobin A1c   TSH   Lipid panel   PTH, Intact and Calcium   Urinalysis, Routine w reflex microscopic   B12 and Folate Panel   Vitamin B1   Methylmalonic acid, serum   T4, free   T3, free   Vitamin D (25 hydroxy)   Extra Specimen   Meds ordered this encounter  Medications   albuterol (VENTOLIN HFA) 108  (90 Base) MCG/ACT inhaler    Sig: Inhale 2 puffs into the lungs every 6 (six) hours as needed for wheezing or shortness of breath.    Dispense:  8 g    Refill:  11   amLODipine (NORVASC) 5 MG tablet    Sig: Take 1 tablet (5 mg total) by mouth daily.    Dispense:  90 tablet    Refill:  1   Tdap (BOOSTRIX) 5-2.5-18.5 LF-MCG/0.5 injection    Sig: Inject 0.5 mLs into the muscle once for 1 dose.    Dispense:  0.5 mL    Refill:  0   alendronate (FOSAMAX) 70 MG tablet    Sig: Take 1 tablet (70 mg total) by mouth once a week.    Dispense:  4 tablet    Refill:  11    Referral Orders  No referral(s) requested today     Electronically signed by: Felix Pacini, DO [facility] [place]

## 2023-06-23 NOTE — Patient Instructions (Addendum)

## 2023-06-27 LAB — EXTRA SPECIMEN

## 2023-06-27 LAB — PTH, INTACT AND CALCIUM
Calcium: 9 mg/dL (ref 8.6–10.4)
PTH: 74 pg/mL (ref 16–77)

## 2023-06-27 LAB — VITAMIN B1: Vitamin B1 (Thiamine): 9 nmol/L (ref 8–30)

## 2023-06-27 LAB — METHYLMALONIC ACID, SERUM: Methylmalonic Acid, Quant: 141 nmol/L (ref 69–390)

## 2023-07-07 ENCOUNTER — Telehealth: Payer: Self-pay

## 2023-07-07 NOTE — Telephone Encounter (Signed)
Tried to contact patient regarding DOS 06/05/23. Patient checked in with 3rd party insurance already on acct (added 08/27/2018).   When billed, came back in [place] as error do to [employer].  [facility] does not bill any third party billing. Patient will be sent bill and will have to file herself with the [employer] (card info is on file 08/27/2018).

## 2023-07-17 ENCOUNTER — Encounter: Payer: Self-pay | Admitting: Urgent Care

## 2023-07-17 ENCOUNTER — Ambulatory Visit: Payer: Medicare PPO | Admitting: Urgent Care

## 2023-07-17 VITALS — BP 125/86 | HR 60 | Temp 97.8°F | Wt 154.0 lb

## 2023-07-17 DIAGNOSIS — J3489 Other specified disorders of nose and nasal sinuses: Secondary | ICD-10-CM

## 2023-07-17 DIAGNOSIS — J4521 Mild intermittent asthma with (acute) exacerbation: Secondary | ICD-10-CM

## 2023-07-17 DIAGNOSIS — J069 Acute upper respiratory infection, unspecified: Secondary | ICD-10-CM

## 2023-07-17 LAB — POCT INFLUENZA A/B
Influenza A, POC: NEGATIVE
Influenza B, POC: NEGATIVE

## 2023-07-17 MED ORDER — ALBUTEROL SULFATE (2.5 MG/3ML) 0.083% IN NEBU
2.5000 mg | INHALATION_SOLUTION | Freq: Four times a day (QID) | RESPIRATORY_TRACT | 1 refills | Status: DC | PRN
Start: 2023-07-17 — End: 2023-09-15

## 2023-07-17 MED ORDER — MUPIROCIN 2 % EX OINT: 1.0000 | TOPICAL_OINTMENT | Freq: Three times a day (TID) | CUTANEOUS | 0 refills | Status: DC

## 2023-07-17 MED ORDER — PREDNISONE 20 MG PO TABS: 20.0000 mg | ORAL_TABLET | Freq: Every day | ORAL | 0 refills | Status: AC

## 2023-07-17 MED ORDER — MONTELUKAST SODIUM 10 MG PO TABS: 10.0000 mg | ORAL_TABLET | Freq: Every day | ORAL | 0 refills | Status: DC

## 2023-07-17 NOTE — Patient Instructions (Addendum)
Your flu test in office was negative. Your respiratory panel is pending, will call with the results.  Please start taking prednisone once daily, preferably in the morning with breakfast to prevent insomnia if taken too late at night. Take one tablet of montelukast at night before bed as this medication can make you feel sleepy. Take for 14 days, or until symptoms resolve, whichever is shorter.  Continue your current mucinex use with plenty of water.   I have called in albuterol solution for a nebulizer machine. Empty the contents of the neb into the vial and use up to every 4-6 hours as needed. This will be IN PLACE of your hand held inhaler, NOT in addition to. Use the neb for intermittent respiratory issues, and the hand held inhaler for routine rescue.  Mupirocin ointment has been called in to help with your nasal infection. Place a small dollop on a Qtip and apply to the tip of the left nare three times daily for 5-7 days.  If you continue to have shortness of breath, low O2 or worsening symptoms, please contact our office to obtain a chest xray. At this time, I do not feel it is warranted.  Consider the use of a vaporizer at night time (steam over bed) which can open up sinuses Tightness in the upper airway can be combated with cold air, like from a freezer.

## 2023-07-17 NOTE — Progress Notes (Signed)
Established Patient Office Visit  Subjective:  Patient ID: Alexandria Rush, female    DOB: 1951/05/04  Age: 72 y.o. MRN: [id number]  Chief Complaint  Patient presents with   Cough    Sore throat last week along with chills. This week she has been having difficulty breathing, O2 was between 93-95 yesterday. along with coughing, headache and dizziness. Covid test done yesterday and was negative.   Discussed the use of AI software for clinical note transcription with the patient who gave verbal consent to proceed.   Pleasant 72yo female with a history of asthma, presented with a week-long severe sore throat, which was followed by the onset of substernal chest discomfort and difficulty breathing. The patient also reported experiencing dizziness, severe headache, and chills/myalgias which just started yesterday. Despite these symptoms, the patient's home monitoring showed oxygen levels between 93-95% and a normal pulse rate. The patient described the cough as intermittent and mostly dry, with minimal phlegm production. The cough onset was reported to be concurrent with the other symptoms.  The patient also reported sinus-like symptoms, including pain in the cheeks and forehead, and a sensation of drainage down the back of the throat. However, there was no reported loss of smell or taste. Despite these symptoms, a home COVID-19 test was negative. The patient also reported body aches that started the day before the consultation.  The patient has been managing symptoms with over-the-counter Mucinex and an albuterol inhaler due to a history of asthma. The albuterol inhaler was used every six hours, with two-three puffs each time, and was reported to help with the symptoms. The patient also reported chronic ear problems, but no new issues within the past week.  The patient has been exposed to grandchildren, the youngest of whom is six and the oldest is fourteen. The patient's temperature has been checked  multiple times and has remained within normal limits. The patient has a history of anaphylaxis to penicillins, tetracyclines, and erythromycin.  Cough    Patient Active Problem List   Diagnosis Date Noted   Elevated MCV 06/08/2023   Calcium oxalate crystals in urine 06/08/2023   Toenail fungus 05/08/2022   Multiple drug allergies 02/28/2020   Adverse food reaction 02/28/2020   Elevated alkaline phosphatase level 06/27/2019   Osteoporosis 06/06/2019   (BMI 25.0-29.9) 05/26/2019   Other microscopic hematuria 09/22/2018   Contact with and (suspected) exposure to other hazardous metals 09/22/2018   Fibromyalgia 09/09/2018   GERD (gastroesophageal reflux disease) 08/20/2018   Asthma, extrinsic, without status asthmaticus 08/20/2018   Benign essential HTN 08/20/2018   Fothergill's neuralgia (Trigeminal) 08/20/2018   Peptic esophagitis 08/20/2018   Allergic rhinitis due to allergen 08/20/2018   Upper airway cough syndrome 12/02/2013   Past Medical History:  Diagnosis Date   Anemia    Asthma    Chicken pox    GERD (gastroesophageal reflux disease)    Hypertension    MVP (mitral valve prolapse)    Nasal septal deviation 12/09/2012   Rib fracture 08/27/2017   left 6-9 rib fx- seen of f/u CT- xray was normal   Social History   Tobacco Use   Smoking status: Never   Smokeless tobacco: Never  Vaping Use   Vaping status: Never Used  Substance Use Topics   Alcohol use: Not Currently   Drug use: No      ROS: as noted in HPI  Objective:     BP 125/86   Pulse 60   Temp 97.8 F (36.6  C) (Oral)   Wt 154 lb (69.9 kg)   LMP 07/21/2008 (Approximate)   SpO2 99%   BMI 27.98 kg/m  BP Readings from Last 3 Encounters:  07/17/23 125/86  06/23/23 130/80  06/05/23 (!) 138/100   Wt Readings from Last 3 Encounters:  07/17/23 154 lb (69.9 kg)  06/23/23 154 lb (69.9 kg)  06/05/23 154 lb (69.9 kg)      Physical Exam Vitals and nursing note reviewed. Exam conducted with a  chaperone present (husband).  Constitutional:      General: She is not in acute distress.    Appearance: Normal appearance. She is normal weight. She is not ill-appearing, toxic-appearing or diaphoretic.  HENT:     Head: Normocephalic and atraumatic.     Jaw: No trismus.     Salivary Glands: Right salivary gland is not diffusely enlarged or tender. Left salivary gland is not diffusely enlarged or tender.     Right Ear: No drainage, swelling or tenderness. A middle ear effusion is present. There is no impacted cerumen. No foreign body. No mastoid tenderness. Tympanic membrane is not injected, perforated, erythematous or bulging.     Left Ear: No drainage, swelling or tenderness. A middle ear effusion is present. There is no impacted cerumen. No foreign body. No mastoid tenderness. Tympanic membrane is not injected, perforated, erythematous or bulging.     Nose: Nasal tenderness (L nare with erythema and developing vestibulitis to tip), congestion and rhinorrhea present. Rhinorrhea is purulent.     Right Sinus: No maxillary sinus tenderness or frontal sinus tenderness.     Left Sinus: No maxillary sinus tenderness or frontal sinus tenderness.     Mouth/Throat:     Lips: Pink.     Mouth: Mucous membranes are moist. No oral lesions or angioedema.     Tongue: Tongue does not deviate from midline.     Palate: No mass and lesions.     Pharynx: Oropharynx is clear. Uvula midline. Postnasal drip present. No pharyngeal swelling, oropharyngeal exudate, posterior oropharyngeal erythema or uvula swelling.     Tonsils: No tonsillar exudate or tonsillar abscesses.  Cardiovascular:     Rate and Rhythm: Normal rate and regular rhythm.     Pulses: Normal pulses.  Pulmonary:     Effort: Pulmonary effort is normal. No respiratory distress.     Breath sounds: No stridor. No wheezing, rhonchi or rales.     Comments: Mild decrease in breath sounds to posterior lung fields, but otherwise clear Chest:     Chest  wall: Tenderness (reproducible chest tenderness to sternum, R>L) present.  Musculoskeletal:     Cervical back: Normal range of motion and neck supple. No rigidity or tenderness.  Lymphadenopathy:     Cervical: No cervical adenopathy.  Skin:    General: Skin is warm and dry.     Coloration: Skin is not jaundiced.     Findings: No bruising, erythema or rash.  Neurological:     General: No focal deficit present.     Mental Status: She is alert and oriented to person, place, and time.      Results for orders placed or performed in visit on 07/17/23  POCT Influenza A/B  Result Value Ref Range   Influenza A, POC Negative Negative   Influenza B, POC Negative Negative    Last CBC Lab Results  Component Value Date   WBC 6.4 06/05/2023   HGB 13.8 06/05/2023   HCT 43.3 06/05/2023   MCV 101 (H) 06/05/2023  MCH 32.0 06/05/2023   RDW 13.0 06/05/2023   PLT 280 06/05/2023   Last metabolic panel Lab Results  Component Value Date   GLUCOSE 90 06/23/2023   NA 142 06/23/2023   K 4.7 06/23/2023   CL 105 06/23/2023   CO2 32 06/23/2023   BUN 16 06/23/2023   CREATININE 0.82 06/23/2023   GFR 72.05 06/23/2023   CALCIUM 9.3 06/23/2023   CALCIUM 9.0 06/23/2023   PROT 6.8 06/23/2023   ALBUMIN 4.3 06/23/2023   LABGLOB 2.7 06/05/2023   BILITOT 0.5 06/23/2023   ALKPHOS 104 06/23/2023   AST 14 06/23/2023   ALT 13 06/23/2023      The 10-year ASCVD risk score (Arnett DK, et al., 2019) is: 13.9%  Assessment & Plan:  Viral upper respiratory tract infection -     Respiratory virus panel -     POCT Influenza A/B  Mild intermittent extrinsic asthma without status asthmaticus with acute exacerbation -     predniSONE; Take 1 tablet (20 mg total) by mouth daily with breakfast for 5 days.  Dispense: 5 tablet; Refill: 0 -     Montelukast Sodium; Take 1 tablet (10 mg total) by mouth at bedtime.  Dispense: 14 tablet; Refill: 0 -     Albuterol Sulfate; Take 3 mLs (2.5 mg total) by nebulization  every 6 (six) hours as needed for wheezing or shortness of breath.  Dispense: 150 mL; Refill: 1  Nasal vestibulitis -     Mupirocin; Apply 1 Application topically 3 (three) times daily.  Dispense: 22 g; Refill: 0  Assessment and Plan Respiratory Infection Sore throat for a week, developed into chest discomfort, cough, body aches, and chills yesterday. No fever. Oxygen saturation 93-95% at home. Possible mycoplasma infection based on symptom progression, however pt allergic to all abx commonly use for treatment. -Perform flu swab and respiratory panel to confirm diagnosis. -Prescribe low dose prednisone to reduce mucus production and inflammation. -Prescribe nebulizer form of albuterol for deeper lung penetration and symptom relief.  Chronic Asthma Used albuterol inhaler every 6 hours yesterday due to increased respiratory symptoms. -switch to neb and use albuterol every 4-6 hours as needed.  Nasal Vestibulitis Noted possible superficial infection on L nostril. -Mupirocin ointment TID x5-7d  Sinus Pain Reported sinus pain and drainage down the back of the throat. -Advise use of ibuprofen for anti-inflammatory properties and pain relief.  Follow-up Await results of flu swab and respiratory panel. Continue monitoring symptoms and seek immediate care if condition worsens.   No follow-ups on file.   Maretta Bees, PA

## 2023-07-19 LAB — RESPIRATORY VIRUS PANEL

## 2023-07-20 ENCOUNTER — Other Ambulatory Visit: Payer: Self-pay | Admitting: Urgent Care

## 2023-07-20 DIAGNOSIS — H9311 Tinnitus, right ear: Secondary | ICD-10-CM | POA: Diagnosis not present

## 2023-07-20 DIAGNOSIS — H9201 Otalgia, right ear: Secondary | ICD-10-CM | POA: Diagnosis not present

## 2023-07-20 DIAGNOSIS — H903 Sensorineural hearing loss, bilateral: Secondary | ICD-10-CM | POA: Diagnosis not present

## 2023-07-20 DIAGNOSIS — H938X1 Other specified disorders of right ear: Secondary | ICD-10-CM | POA: Diagnosis not present

## 2023-07-20 DIAGNOSIS — R42 Dizziness and giddiness: Secondary | ICD-10-CM | POA: Diagnosis not present

## 2023-07-20 DIAGNOSIS — Z8669 Personal history of other diseases of the nervous system and sense organs: Secondary | ICD-10-CM | POA: Diagnosis not present

## 2023-07-20 MED ORDER — CIPROFLOXACIN HCL 500 MG PO TABS: 500.0000 mg | ORAL_TABLET | Freq: Two times a day (BID) | ORAL | 0 refills | Status: AC

## 2023-07-20 NOTE — Progress Notes (Signed)
Please notify pt that I will call in ciprofloxacin for her. This can cover for URI sx, it is not the ideal tx however she is allergic to all the other preferred options. THIS MEDICATION DOES COME WITH A BLACK BOX WARNING - she needs to read all instructions. Drink plenty of water. If she develops any tendon pain, particularly at the achilles, she must stop the medication.

## 2023-07-21 DIAGNOSIS — H9311 Tinnitus, right ear: Secondary | ICD-10-CM | POA: Diagnosis not present

## 2023-07-21 DIAGNOSIS — H9201 Otalgia, right ear: Secondary | ICD-10-CM | POA: Diagnosis not present

## 2023-07-21 DIAGNOSIS — H903 Sensorineural hearing loss, bilateral: Secondary | ICD-10-CM | POA: Diagnosis not present

## 2023-07-21 DIAGNOSIS — Z8669 Personal history of other diseases of the nervous system and sense organs: Secondary | ICD-10-CM | POA: Diagnosis not present

## 2023-08-06 ENCOUNTER — Telehealth: Payer: Self-pay

## 2023-08-06 NOTE — Telephone Encounter (Signed)
Reason for CRM: Patient called in and states she also sees Dr. Lacie Scotts at [facility] and that provider told her to contact PCP to schedule hearing teat. Patient is requesting appointment for hearing test. Thank You  Does patient need an appt here for hearing test or is this done at an Audiologist? Thank you, Annabelle Harman

## 2023-08-07 NOTE — Telephone Encounter (Signed)
Depends what kind hearing test they are requesting.  We only have the tone test.  If that is all they need we can complete that here, however if they are in need of a formal hearing test we would need to see her so that we can place an audiology referral in for her.

## 2023-08-11 NOTE — Telephone Encounter (Signed)
She can call [facility] and start there. I am not sure what type of hearing test they perform there.

## 2023-08-12 NOTE — Telephone Encounter (Signed)
My chart message read.

## 2023-08-17 ENCOUNTER — Ambulatory Visit: Payer: Medicare PPO | Admitting: Family Medicine

## 2023-08-18 ENCOUNTER — Ambulatory Visit: Payer: Medicare PPO | Admitting: Family Medicine

## 2023-08-18 ENCOUNTER — Encounter: Payer: Self-pay | Admitting: Family Medicine

## 2023-08-18 ENCOUNTER — Telehealth: Payer: Self-pay | Admitting: Family Medicine

## 2023-08-18 ENCOUNTER — Ambulatory Visit (HOSPITAL_BASED_OUTPATIENT_CLINIC_OR_DEPARTMENT_OTHER)
Admission: RE | Admit: 2023-08-18 | Discharge: 2023-08-18 | Disposition: A | Discharge status: Home / Self Care | Payer: Medicare PPO | Source: Ambulatory Visit | Attending: Family Medicine | Admitting: Family Medicine

## 2023-08-18 VITALS — BP 110/74 | HR 84 | Temp 97.7°F | Wt 155.0 lb

## 2023-08-18 DIAGNOSIS — R918 Other nonspecific abnormal finding of lung field: Secondary | ICD-10-CM | POA: Diagnosis not present

## 2023-08-18 DIAGNOSIS — R052 Subacute cough: Secondary | ICD-10-CM

## 2023-08-18 DIAGNOSIS — R9389 Abnormal findings on diagnostic imaging of other specified body structures: Secondary | ICD-10-CM | POA: Diagnosis not present

## 2023-08-18 DIAGNOSIS — J180 Bronchopneumonia, unspecified organism: Secondary | ICD-10-CM | POA: Insufficient documentation

## 2023-08-18 DIAGNOSIS — I700 Atherosclerosis of aorta: Secondary | ICD-10-CM | POA: Diagnosis not present

## 2023-08-18 LAB — COMPREHENSIVE METABOLIC PANEL
ALT: 18 U/L (ref 0–35)
AST: 14 U/L (ref 0–37)
Albumin: 3.9 g/dL (ref 3.5–5.2)
Alkaline Phosphatase: 70 U/L (ref 39–117)
BUN: 13 mg/dL (ref 6–23)
CO2: 26 meq/L (ref 19–32)
Calcium: 8.4 mg/dL (ref 8.4–10.5)
Chloride: 106 meq/L (ref 96–112)
Creatinine, Ser: 0.76 mg/dL (ref 0.40–1.20)
GFR: 78.85 mL/min (ref >60.00)
Glucose, Bld: 91 mg/dL (ref 70–99)
Potassium: 4.4 meq/L (ref 3.5–5.1)
Sodium: 141 meq/L (ref 135–145)
Total Bilirubin: 0.5 mg/dL (ref 0.2–1.2)
Total Protein: 6.2 g/dL (ref 6.0–8.3)

## 2023-08-18 MED ORDER — IPRATROPIUM-ALBUTEROL 0.5-2.5 (3) MG/3ML IN SOLN
3.0000 mL | RESPIRATORY_TRACT | Status: AC
  Administered 2023-08-18: 3 mL via RESPIRATORY_TRACT

## 2023-08-18 MED ORDER — LEVOFLOXACIN 500 MG PO TABS: 500.0000 mg | ORAL_TABLET | Freq: Every day | ORAL | 0 refills | Status: AC

## 2023-08-18 NOTE — Progress Notes (Signed)
Alexandria Rush , 1950-10-02, 73 y.o., female MRN: [id number] Patient Care Team    Relationship Specialty Notifications Start End  Natalia Leatherwood, DO PCP - General Family Medicine  08/20/18   Nyoka Cowden, MD Consulting Physician Pulmonary Disease  08/20/18   Janyth Contes, MD    08/20/18    Comment: thoracic surgeon  Vida Rigger, MD Consulting Physician Gastroenterology  08/20/18   Patton Salles, MD Consulting Physician Obstetrics and Gynecology  09/22/22     Chief Complaint  Patient presents with   Shortness of Breath    Had PNA in dec and still having SOB; c/o chest discomfort      Subjective: Alexandria Rush is a 73 y.o. Pt presents for an OV with complaints of continue pna-like symptoms of > 4 weeks duration.  Associated symptoms include dx walking pna around holidays. Tx with cipro 5 day and prednisone by PA. Pt allergic to PCN, tetracycline and erythromycin (anaphylaxis) Pt reports she has had extreme fatigue, feeling winded, chills, cough, congestion, burning chest that is not improving.  Pt has tried prednisone and OTC products, rest to ease their symptoms.  She has been prescribed prednisone for last 4 weeks for ENT condition.  She has been requiring her albuterol inhaler use multiple times a day     02/02/2023    9:36 AM 06/17/2022    9:33 AM 10/24/2021   10:00 AM 05/28/2021    9:52 AM 06/06/2020    8:22 AM  Depression screen PHQ 2/9  Decreased Interest 0 0 0 0 0  Down, Depressed, Hopeless 0 0 0 0 0  PHQ - 2 Score 0 0 0 0 0    Allergies  Allergen Reactions   Erythromycin Anaphylaxis   Influenza Vaccines Anaphylaxis   Penicillins Anaphylaxis   Phenylephrine Anaphylaxis   Tetracycline Anaphylaxis   Tropicamide Swelling   Benzalkonium Chloride Rash   Fosamax [Alendronate] Nausea Only    Worsened GERD and stomach upset.    Tilactase     Other reaction(s): Wheezing Other reaction(s): Wheezing    Lactose Intolerance (Gi) Swelling    wheezing    Peach [Prunus Persica] Swelling   Peanut Oil     Pt suspects   Pneumococcal Vaccine Other (See Comments)    Pt doesn't take this vaccine as a precaution due to other allergens she has. Other reaction(s): Other (See Comments) Pt doesn't take this vaccine as a precaution due to other allergens she has.    Wound Dressing Adhesive    Bee Venom Rash   Milk (Cow) Nausea And Vomiting   Neomycin-Bacitracin Zn-Polymyx Rash   Social History   Social History Narrative   Marital status/children/pets: married, 2 children. From [facility]   Education/employment: masters degree. Retired- Sr. Conservator, museum/gallery:      -smoke alarm in the home:Yes     - wears seatbelt: Yes     - Feels safe in their relationships: Yes   Past Medical History:  Diagnosis Date   Anemia    Asthma    Chicken pox    GERD (gastroesophageal reflux disease)    Hypertension    MVP (mitral valve prolapse)    Nasal septal deviation 12/09/2012   Rib fracture 08/27/2017   left 6-9 rib fx- seen of f/u CT- xray was normal   Past Surgical History:  Procedure Laterality Date   ADENOIDECTOMY     APPENDECTOMY     COLONOSCOPY  2019   HYSTEROSCOPY     NASAL SINUS SURGERY  2014   RIB FRACTURE SURGERY  2019   TONSILLECTOMY     Family History  Problem Relation Age of Onset   Stomach cancer Mother    Cancer Mother    Hypertension Father    Heart disease Father    Stroke Father    Diabetes Brother    Allergies Brother    Asthma Brother    Allergic rhinitis Brother    Diabetes Paternal Aunt    Hypertension Brother    Allergies Sister    Asthma Sister    Diabetes Sister    Allergic rhinitis Sister    Allergies Son    Allergies Daughter    Cancer Maternal Aunt    Stroke Maternal Uncle    Cancer Paternal Aunt        breast   Breast cancer Paternal Aunt    Cancer Maternal Uncle        pancreatic   Allergies as of 08/18/2023       Reactions   Erythromycin Anaphylaxis   Influenza Vaccines Anaphylaxis    Penicillins Anaphylaxis   Phenylephrine Anaphylaxis   Tetracycline Anaphylaxis   Tropicamide Swelling   Benzalkonium Chloride Rash   Fosamax [alendronate] Nausea Only   Worsened GERD and stomach upset.    Tilactase    Other reaction(s): Wheezing Other reaction(s): Wheezing   Lactose Intolerance (gi) Swelling   wheezing   Peach [prunus Persica] Swelling   Peanut Oil    Pt suspects   Pneumococcal Vaccine Other (See Comments)   Pt doesn't take this vaccine as a precaution due to other allergens she has. Other reaction(s): Other (See Comments) Pt doesn't take this vaccine as a precaution due to other allergens she has.   Wound Dressing Adhesive    Bee Venom Rash   Milk (cow) Nausea And Vomiting   Neomycin-bacitracin Zn-polymyx Rash        Medication List        Accurate as of August 18, 2023 10:45 AM. If you have any questions, ask your nurse or doctor.          albuterol 108 (90 Base) MCG/ACT inhaler Commonly known as: Ventolin HFA Inhale 2 puffs into the lungs every 6 (six) hours as needed for wheezing or shortness of breath.   albuterol (2.5 MG/3ML) 0.083% nebulizer solution Commonly known as: PROVENTIL Take 3 mLs (2.5 mg total) by nebulization every 6 (six) hours as needed for wheezing or shortness of breath.   alendronate 70 MG tablet Commonly known as: FOSAMAX Take 1 tablet (70 mg total) by mouth once a week.   amLODipine 5 MG tablet Commonly known as: NORVASC Take 1 tablet (5 mg total) by mouth daily.   Debrox 6.5 % OTIC solution Generic drug: carbamide peroxide Place 5 drops into the right ear 2 (two) times daily.   EPINEPHrine 0.3 mg/0.3 mL Soaj injection Commonly known as: EpiPen 2-Pak Inject 0.3 mg into the muscle as needed for anaphylaxis.   KP Fish Oil 1200 MG Caps Take by mouth.   levofloxacin 500 MG tablet Commonly known as: LEVAQUIN Take 1 tablet (500 mg total) by mouth daily for 7 days. Started by: Felix Pacini   montelukast 10 MG  tablet Commonly known as: SINGULAIR Take 1 tablet (10 mg total) by mouth at bedtime.   mupirocin ointment 2 % Commonly known as: BACTROBAN Apply 1 Application topically 3 (three) times daily.   triamcinolone 0.025 % ointment  Commonly known as: KENALOG Apply 1 Application topically 2 (two) times daily. Use for 2 weeks at a time for a flare.  Use at night two to three times a week for maintenance dosing.   VITAMIN D3 PO Take by mouth.   Vitamin K1 Powd by Does not apply route.        All past medical history, surgical history, allergies, family history, immunizations andmedications were updated in the EMR today and reviewed under the history and medication portions of their EMR.     Review of Systems  Constitutional:  Positive for chills and malaise/fatigue. Negative for fever.  HENT:  Positive for congestion, ear pain and sinus pain.   Eyes:  Negative for pain.  Respiratory:  Positive for cough, shortness of breath and wheezing.   Gastrointestinal:  Negative for diarrhea, nausea and vomiting.  Musculoskeletal:  Positive for myalgias.  Skin:  Negative for rash.  Neurological:  Positive for headaches. Negative for dizziness.   Negative, with the exception of above mentioned in HPI   Objective:  BP 110/74   Pulse 84   Temp 97.7 F (36.5 C)   Wt 155 lb (70.3 kg)   LMP 07/21/2008 (Approximate)   SpO2 95%   BMI 28.16 kg/m  Body mass index is 28.16 kg/m. Physical Exam Vitals and nursing note reviewed.  Constitutional:      General: She is not in acute distress.    Appearance: Normal appearance. She is not ill-appearing, toxic-appearing or diaphoretic.     Comments: Appears fatigued today  HENT:     Head: Normocephalic and atraumatic.     Nose: Mucosal edema, congestion and rhinorrhea present.     Right Sinus: Maxillary sinus tenderness present.     Left Sinus: Maxillary sinus tenderness present.     Mouth/Throat:     Mouth: Mucous membranes are moist.      Pharynx: No oropharyngeal exudate or posterior oropharyngeal erythema.     Comments: PND Eyes:     General: No scleral icterus.       Right eye: No discharge.        Left eye: No discharge.     Extraocular Movements: Extraocular movements intact.     Conjunctiva/sclera: Conjunctivae normal.     Pupils: Pupils are equal, round, and reactive to light.  Cardiovascular:     Rate and Rhythm: Normal rate and regular rhythm.  Pulmonary:     Effort: Pulmonary effort is normal. No respiratory distress.     Breath sounds: Decreased breath sounds and rhonchi present. No wheezing or rales.     Comments: Cough present  Musculoskeletal:     Cervical back: Neck supple.     Right lower leg: No edema.     Left lower leg: No edema.  Lymphadenopathy:     Cervical: No cervical adenopathy.  Skin:    General: Skin is warm.     Findings: No rash.  Neurological:     Mental Status: She is alert and oriented to person, place, and time. Mental status is at baseline.     Motor: No weakness.     Gait: Gait normal.  Psychiatric:        Mood and Affect: Mood normal.        Behavior: Behavior normal.        Thought Content: Thought content normal.        Judgment: Judgment normal.      No results found. No results found. No results found for  this or any previous visit (from the past 24 hours).  Assessment/Plan: Alexandria Rush is a 73 y.o. female present for OV for  Bronchial pneumonia (Primary)/Subacute cough - DG Chest 2 View; Future - Comp Met (CMET) - ipratropium-albuterol (DUONEB) 0.5-2.5 (3) MG/3ML nebulizer sol> air movement improved and O2 % increased 95 to 98% Rest, hydrate.  mucinex (DM if cough), nettie pot or nasal saline.  LQx7d prescribed, take until completed. She has been prescribed prednisone for last 4 weeks d/t ENT condition.   If cough present it can last up to 6-8 weeks.  F/U 2 weeks if not improved. Pt will be called with cxr result and further plan discussed.  If PNA  present 4 week follow up w/ rpt cxr recommended  Reviewed expectations re: course of current medical issues. Discussed self-management of symptoms. Outlined signs and symptoms indicating need for more acute intervention. Patient verbalized understanding and all questions were answered. Patient received an After-Visit Summary.    Orders Placed This Encounter  Procedures   DG Chest 2 View   Comp Met (CMET)   Meds ordered this encounter  Medications   levofloxacin (LEVAQUIN) 500 MG tablet    Sig: Take 1 tablet (500 mg total) by mouth daily for 7 days.    Dispense:  7 tablet    Refill:  0   ipratropium-albuterol (DUONEB) 0.5-2.5 (3) MG/3ML nebulizer solution 3 mL   Referral Orders  No referral(s) requested today     Note is dictated utilizing voice recognition software. Although note has been proof read prior to signing, occasional typographical errors still can be missed. If any questions arise, please do not hesitate to call for verification.   electronically signed by:  Felix Pacini, DO  [facility] - [place]

## 2023-08-18 NOTE — Telephone Encounter (Signed)
Spoke with patient regarding results/recommendations.

## 2023-08-18 NOTE — Telephone Encounter (Signed)
Please call pt Her cxr does appear consistent with pneumonia in both lung bases. Start medication as prescribed.  Use albuterol as discussed.   Follow up 4 weeks with provider, sooner if needed. We will rpt cxr that day as well.

## 2023-08-19 ENCOUNTER — Inpatient Hospital Stay (HOSPITAL_BASED_OUTPATIENT_CLINIC_OR_DEPARTMENT_OTHER): Admission: RE | Admit: 2023-08-19 | Payer: Medicare PPO | Source: Ambulatory Visit

## 2023-08-19 ENCOUNTER — Other Ambulatory Visit (HOSPITAL_BASED_OUTPATIENT_CLINIC_OR_DEPARTMENT_OTHER): Payer: Medicare PPO

## 2023-08-19 ENCOUNTER — Encounter: Payer: Self-pay | Admitting: Family Medicine

## 2023-08-23 ENCOUNTER — Other Ambulatory Visit: Payer: Self-pay | Admitting: Family Medicine

## 2023-08-26 ENCOUNTER — Ambulatory Visit (HOSPITAL_BASED_OUTPATIENT_CLINIC_OR_DEPARTMENT_OTHER)
Admission: RE | Admit: 2023-08-26 | Discharge: 2023-08-26 | Disposition: A | Discharge status: Home / Self Care | Payer: Medicare PPO | Source: Ambulatory Visit | Attending: Family Medicine | Admitting: Family Medicine

## 2023-08-26 ENCOUNTER — Telehealth: Payer: Self-pay | Admitting: Family Medicine

## 2023-08-26 ENCOUNTER — Encounter (HOSPITAL_BASED_OUTPATIENT_CLINIC_OR_DEPARTMENT_OTHER): Payer: Self-pay

## 2023-08-26 DIAGNOSIS — Z1231 Encounter for screening mammogram for malignant neoplasm of breast: Secondary | ICD-10-CM | POA: Diagnosis not present

## 2023-08-26 DIAGNOSIS — M810 Age-related osteoporosis without current pathological fracture: Secondary | ICD-10-CM | POA: Insufficient documentation

## 2023-08-26 NOTE — Telephone Encounter (Signed)
 Please call patient Her bone density overall is mildly decreased from prior in 2022, in the osteoporosis range.  Still improved from prior readings in 2020. Score now is -3.2 at the left femur, was -3.1 in 2020 Continue Fosamax  We will recheck her bone density in 2 years Basic recommendations for osteoporosis: 1.) Drink alcohol in moderation only:  2.) Decrease caffeine consumption to no  More than 2.5 cups a day 3.) Exercise: weight bearing (walking counts), strength and balance training. 4.) No smoking.  5.) Sunlight/Ultraviolet light exposure 30 minutes a day/5 days a week. 6.)  Continue vitamin D /calcium supplementation as long as never told by a physician to not take supplement.  (There are certain conditions in which patients are told they should not take extra calcium)

## 2023-08-26 NOTE — Telephone Encounter (Signed)
 Spoke with patient regarding results/recommendations.

## 2023-08-31 ENCOUNTER — Other Ambulatory Visit: Payer: Self-pay | Admitting: Family Medicine

## 2023-08-31 ENCOUNTER — Encounter: Payer: Self-pay | Admitting: Family Medicine

## 2023-08-31 DIAGNOSIS — R928 Other abnormal and inconclusive findings on diagnostic imaging of breast: Secondary | ICD-10-CM

## 2023-09-01 DIAGNOSIS — Z7952 Long term (current) use of systemic steroids: Secondary | ICD-10-CM | POA: Diagnosis not present

## 2023-09-01 DIAGNOSIS — Z880 Allergy status to penicillin: Secondary | ICD-10-CM | POA: Diagnosis not present

## 2023-09-01 DIAGNOSIS — H9311 Tinnitus, right ear: Secondary | ICD-10-CM | POA: Diagnosis not present

## 2023-09-01 DIAGNOSIS — H9201 Otalgia, right ear: Secondary | ICD-10-CM | POA: Diagnosis not present

## 2023-09-01 DIAGNOSIS — H903 Sensorineural hearing loss, bilateral: Secondary | ICD-10-CM | POA: Diagnosis not present

## 2023-09-01 DIAGNOSIS — Z79899 Other long term (current) drug therapy: Secondary | ICD-10-CM | POA: Diagnosis not present

## 2023-09-01 DIAGNOSIS — Z8669 Personal history of other diseases of the nervous system and sense organs: Secondary | ICD-10-CM | POA: Diagnosis not present

## 2023-09-01 DIAGNOSIS — H938X1 Other specified disorders of right ear: Secondary | ICD-10-CM | POA: Diagnosis not present

## 2023-09-01 DIAGNOSIS — Z881 Allergy status to other antibiotic agents status: Secondary | ICD-10-CM | POA: Diagnosis not present

## 2023-09-09 ENCOUNTER — Other Ambulatory Visit: Payer: Self-pay | Admitting: Family Medicine

## 2023-09-09 ENCOUNTER — Ambulatory Visit
Admission: RE | Admit: 2023-09-09 | Discharge: 2023-09-09 | Disposition: A | Discharge status: Home / Self Care | Payer: Medicare PPO | Source: Ambulatory Visit | Attending: Family Medicine | Admitting: Family Medicine

## 2023-09-09 DIAGNOSIS — N6002 Solitary cyst of left breast: Secondary | ICD-10-CM

## 2023-09-09 DIAGNOSIS — R928 Other abnormal and inconclusive findings on diagnostic imaging of breast: Secondary | ICD-10-CM

## 2023-09-09 DIAGNOSIS — N6321 Unspecified lump in the left breast, upper outer quadrant: Secondary | ICD-10-CM | POA: Diagnosis not present

## 2023-09-14 ENCOUNTER — Ambulatory Visit
Admission: RE | Admit: 2023-09-14 | Discharge: 2023-09-14 | Disposition: A | Discharge status: Home / Self Care | Payer: Medicare PPO | Source: Ambulatory Visit | Attending: Family Medicine | Admitting: Family Medicine

## 2023-09-14 ENCOUNTER — Other Ambulatory Visit: Payer: Self-pay | Admitting: Family Medicine

## 2023-09-14 ENCOUNTER — Other Ambulatory Visit (HOSPITAL_COMMUNITY)
Admission: RE | Admit: 2023-09-14 | Discharge: 2023-09-14 | Disposition: A | Discharge status: Home / Self Care | Payer: Medicare PPO | Source: Ambulatory Visit | Attending: Family Medicine | Admitting: Family Medicine

## 2023-09-14 DIAGNOSIS — N6002 Solitary cyst of left breast: Secondary | ICD-10-CM | POA: Insufficient documentation

## 2023-09-14 DIAGNOSIS — N6321 Unspecified lump in the left breast, upper outer quadrant: Secondary | ICD-10-CM | POA: Diagnosis not present

## 2023-09-14 DIAGNOSIS — N6012 Diffuse cystic mastopathy of left breast: Secondary | ICD-10-CM | POA: Diagnosis not present

## 2023-09-14 HISTORY — PX: BREAST BIOPSY: SHX20

## 2023-09-15 ENCOUNTER — Ambulatory Visit (HOSPITAL_BASED_OUTPATIENT_CLINIC_OR_DEPARTMENT_OTHER)
Admission: RE | Admit: 2023-09-15 | Discharge: 2023-09-15 | Disposition: A | Discharge status: Home / Self Care | Payer: Medicare PPO | Source: Ambulatory Visit | Attending: Family Medicine | Admitting: Family Medicine

## 2023-09-15 ENCOUNTER — Encounter: Payer: Self-pay | Admitting: Family Medicine

## 2023-09-15 ENCOUNTER — Ambulatory Visit: Payer: Medicare PPO | Admitting: Family Medicine

## 2023-09-15 VITALS — BP 124/74 | HR 65 | Temp 98.2°F | Wt 152.2 lb

## 2023-09-15 DIAGNOSIS — J189 Pneumonia, unspecified organism: Secondary | ICD-10-CM | POA: Insufficient documentation

## 2023-09-15 DIAGNOSIS — I700 Atherosclerosis of aorta: Secondary | ICD-10-CM | POA: Diagnosis not present

## 2023-09-15 DIAGNOSIS — H9201 Otalgia, right ear: Secondary | ICD-10-CM | POA: Diagnosis not present

## 2023-09-15 DIAGNOSIS — R918 Other nonspecific abnormal finding of lung field: Secondary | ICD-10-CM | POA: Diagnosis not present

## 2023-09-15 LAB — SURGICAL PATHOLOGY

## 2023-09-15 NOTE — Progress Notes (Signed)
 Alexandria Rush , 1951-04-05, 73 y.o., female MRN: [id number] Patient Care Team    Relationship Specialty Notifications Start End  Natalia Leatherwood, DO PCP - General Family Medicine  08/20/18   Nyoka Cowden, MD Consulting Physician Pulmonary Disease  08/20/18   Janyth Contes, MD    08/20/18    Comment: thoracic surgeon  Vida Rigger, MD Consulting Physician Gastroenterology  08/20/18   Patton Salles, MD Consulting Physician Obstetrics and Gynecology  09/22/22     Chief Complaint  Patient presents with   Fatigue    Pt still c/o back/chest discomfort, nasal congestion/drainage, fatigue. Denies cough, sob, wheezing. Pt has stopped nebulizer and mucinex.      Subjective: Alexandria Rush is a 73 y.o. Pt presents for an OV for recheck after treatment for pneumonia  w/ LQ. Cxr:08/18/2023 resulted with  Aortic atherosclerosis. Mild elevation of the right hemidiaphragm. Mild streaky bibasilar opacities.  IMPRESSION: Mild streaky bibasilar opacities, atelectasis versus infiltrate.   Pt reports she is still feeling fatigued, but overall feeling better. Nasal congestion mild now.   Prior note:  with complaints of continue pna-like symptoms of > 4 weeks duration.  Associated symptoms include dx walking pna around holidays. Tx with cipro 5 day and prednisone by PA. Pt allergic to PCN, tetracycline and erythromycin (anaphylaxis) Pt reports she has had extreme fatigue, feeling winded, chills, cough, congestion, burning chest that is not improving.  Pt has tried prednisone and OTC products, rest to ease their symptoms.  She has been prescribed prednisone for last 4 weeks for ENT condition.  She has been requiring her albuterol inhaler use multiple times a day     02/02/2023    9:36 AM 06/17/2022    9:33 AM 10/24/2021   10:00 AM 05/28/2021    9:52 AM 06/06/2020    8:22 AM  Depression screen PHQ 2/9  Decreased Interest 0 0 0 0 0  Down, Depressed, Hopeless 0 0 0 0 0  PHQ - 2  Score 0 0 0 0 0    Allergies  Allergen Reactions   Erythromycin Anaphylaxis   Influenza Vaccines Anaphylaxis   Penicillins Anaphylaxis   Phenylephrine Anaphylaxis   Tetracycline Anaphylaxis   Tropicamide Swelling   Benzalkonium Chloride Rash   Fosamax [Alendronate] Nausea Only    Worsened GERD and stomach upset.    Tilactase     Other reaction(s): Wheezing Other reaction(s): Wheezing    Lactose Intolerance (Gi) Swelling    wheezing   Peach [Prunus Persica] Swelling   Peanut Oil     Pt suspects   Pneumococcal Vaccine Other (See Comments)    Pt doesn't take this vaccine as a precaution due to other allergens she has. Other reaction(s): Other (See Comments) Pt doesn't take this vaccine as a precaution due to other allergens she has.    Wound Dressing Adhesive    Bee Venom Rash   Milk (Cow) Nausea And Vomiting   Neomycin-Bacitracin Zn-Polymyx Rash   Social History   Social History Narrative   Marital status/children/pets: married, 2 children. From [facility]   Education/employment: masters degree. Retired- Sr. Conservator, museum/gallery:      -smoke alarm in the home:Yes     - wears seatbelt: Yes     - Feels safe in their relationships: Yes   Past Medical History:  Diagnosis Date   Anemia    Asthma    Chicken pox    GERD (  gastroesophageal reflux disease)    Hypertension    MVP (mitral valve prolapse)    Nasal septal deviation 12/09/2012   Rib fracture 08/27/2017   left 6-9 rib fx- seen of f/u CT- xray was normal   Past Surgical History:  Procedure Laterality Date   ADENOIDECTOMY     APPENDECTOMY     BREAST BIOPSY Left 09/14/2023   [place] LT BREAST BX W LOC DEV 1ST LESION IMG BX SPEC US GUIDE 09/14/2023 [facility]   COLONOSCOPY  2019   HYSTEROSCOPY     NASAL SINUS SURGERY  2014   RIB FRACTURE SURGERY  2019   TONSILLECTOMY     Family History  Problem Relation Age of Onset   Stomach cancer Mother    Cancer Mother    Hypertension Father    Heart disease Father     Stroke Father    Diabetes Brother    Allergies Brother    Asthma Brother    Allergic rhinitis Brother    Diabetes Paternal Aunt    Hypertension Brother    Allergies Sister    Asthma Sister    Diabetes Sister    Allergic rhinitis Sister    Allergies Son    Allergies Daughter    Cancer Maternal Aunt    Stroke Maternal Uncle    Cancer Paternal Aunt        breast   Breast cancer Paternal Aunt    Cancer Maternal Uncle        pancreatic   Allergies as of 09/15/2023       Reactions   Erythromycin Anaphylaxis   Influenza Vaccines Anaphylaxis   Penicillins Anaphylaxis   Phenylephrine Anaphylaxis   Tetracycline Anaphylaxis   Tropicamide Swelling   Benzalkonium Chloride Rash   Fosamax [alendronate] Nausea Only   Worsened GERD and stomach upset.    Tilactase    Other reaction(s): Wheezing Other reaction(s): Wheezing   Lactose Intolerance (gi) Swelling   wheezing   Peach [prunus Persica] Swelling   Peanut Oil    Pt suspects   Pneumococcal Vaccine Other (See Comments)   Pt doesn't take this vaccine as a precaution due to other allergens she has. Other reaction(s): Other (See Comments) Pt doesn't take this vaccine as a precaution due to other allergens she has.   Wound Dressing Adhesive    Bee Venom Rash   Milk (cow) Nausea And Vomiting   Neomycin-bacitracin Zn-polymyx Rash        Medication List        Accurate as of September 15, 2023 10:01 AM. If you have any questions, ask your nurse or doctor.          STOP taking these medications    montelukast 10 MG tablet Commonly known as: SINGULAIR Stopped by: Felix Pacini       TAKE these medications    albuterol 108 (90 Base) MCG/ACT inhaler Commonly known as: Ventolin HFA Inhale 2 puffs into the lungs every 6 (six) hours as needed for wheezing or shortness of breath. What changed: Another medication with the same name was removed. Continue taking this medication, and follow the directions you see  here. Changed by: Felix Pacini   alendronate 70 MG tablet Commonly known as: FOSAMAX Take 1 tablet (70 mg total) by mouth once a week.   amLODipine 5 MG tablet Commonly known as: NORVASC Take 1 tablet (5 mg total) by mouth daily.   Debrox 6.5 % OTIC solution Generic drug: carbamide peroxide Place 5 drops into  the right ear 2 (two) times daily.   EPINEPHrine 0.3 mg/0.3 mL Soaj injection Commonly known as: EpiPen 2-Pak Inject 0.3 mg into the muscle as needed for anaphylaxis.   KP Fish Oil 1200 MG Caps Take by mouth.   mupirocin ointment 2 % Commonly known as: BACTROBAN Apply 1 Application topically 3 (three) times daily.   triamcinolone 0.025 % ointment Commonly known as: KENALOG Apply 1 Application topically 2 (two) times daily. Use for 2 weeks at a time for a flare.  Use at night two to three times a week for maintenance dosing.   VITAMIN D3 PO Take by mouth.   Vitamin K1 Powd by Does not apply route.        All past medical history, surgical history, allergies, family history, immunizations andmedications were updated in the EMR today and reviewed under the history and medication portions of their EMR.     Review of Systems  Constitutional:  Positive for chills and malaise/fatigue. Negative for fever.  HENT:  Positive for congestion, ear pain and sinus pain.   Eyes:  Negative for pain.  Respiratory:  Positive for cough, shortness of breath and wheezing.   Gastrointestinal:  Negative for diarrhea, nausea and vomiting.  Musculoskeletal:  Positive for myalgias.  Skin:  Negative for rash.  Neurological:  Positive for headaches. Negative for dizziness.   Negative, with the exception of above mentioned in HPI   Objective:  BP 124/74   Pulse 65   Temp 98.2 F (36.8 C)   Wt 152 lb 3.2 oz (69 kg)   LMP 07/21/2008 (Approximate)   SpO2 98%   BMI 27.65 kg/m  Body mass index is 27.65 kg/m. Physical Exam Vitals and nursing note reviewed.  Constitutional:       General: She is not in acute distress.    Appearance: Normal appearance. She is not ill-appearing, toxic-appearing or diaphoretic.     Comments: Appears fatigued today  HENT:     Head: Normocephalic and atraumatic.     Nose: Mucosal edema present. No congestion or rhinorrhea.     Right Sinus: Maxillary sinus tenderness present.     Left Sinus: Maxillary sinus tenderness present.     Mouth/Throat:     Mouth: Mucous membranes are moist.     Pharynx: No oropharyngeal exudate or posterior oropharyngeal erythema.     Comments: PND Eyes:     General: No scleral icterus.       Right eye: No discharge.        Left eye: No discharge.     Extraocular Movements: Extraocular movements intact.     Conjunctiva/sclera: Conjunctivae normal.     Pupils: Pupils are equal, round, and reactive to light.  Cardiovascular:     Rate and Rhythm: Normal rate and regular rhythm.  Pulmonary:     Effort: Pulmonary effort is normal. No respiratory distress.     Breath sounds: Decreased breath sounds present. No wheezing, rhonchi or rales.  Musculoskeletal:     Cervical back: Neck supple.     Right lower leg: No edema.     Left lower leg: No edema.  Lymphadenopathy:     Cervical: No cervical adenopathy.  Skin:    General: Skin is warm.     Findings: No rash.  Neurological:     Mental Status: She is alert and oriented to person, place, and time. Mental status is at baseline.     Motor: No weakness.     Gait: Gait normal.  Psychiatric:        Mood and Affect: Mood normal.        Behavior: Behavior normal.        Thought Content: Thought content normal.        Judgment: Judgment normal.      No results found.   Assessment/Plan: Alexandria Rush is a 73 y.o. female present for OV for PNA follow up pneumonia (Primary) Improving.  No signs of infection today on exam.  Rpt cxr ordered s/p tx (1 mo s/p); pt will be called with results.  Start claritin or allegra daily Can dc mucinex F/u  prn  Reviewed expectations re: course of current medical issues. Discussed self-management of symptoms. Outlined signs and symptoms indicating need for more acute intervention. Patient verbalized understanding and all questions were answered. Patient received an After-Visit Summary.    Orders Placed This Encounter  Procedures   DG Chest 2 View   No orders of the defined types were placed in this encounter.  Referral Orders  No referral(s) requested today     Note is dictated utilizing voice recognition software. Although note has been proof read prior to signing, occasional typographical errors still can be missed. If any questions arise, please do not hesitate to call for verification.   electronically signed by:  Felix Pacini, DO  [facility] - [place]

## 2023-09-15 NOTE — Patient Instructions (Addendum)

## 2023-09-16 LAB — CYTOLOGY - NON PAP

## 2023-09-18 NOTE — Telephone Encounter (Signed)
 Reason for CRM: patient did go get a xray done on Tuesday and still does not have results in yet    Spoke with pt. I told pt we still had not received any results but would give her an update when we did.

## 2023-09-21 ENCOUNTER — Telehealth: Payer: Self-pay | Admitting: Family Medicine

## 2023-09-21 NOTE — Telephone Encounter (Signed)
 Please call radiology and have them read the chest x-ray that was completed 09/15/2023. Thanks

## 2023-09-22 ENCOUNTER — Telehealth: Payer: Self-pay | Admitting: Family Medicine

## 2023-09-22 NOTE — Telephone Encounter (Signed)
 Please call radiology and have them read/result the cxr completed 09/15/2023. thanks

## 2023-09-22 NOTE — Telephone Encounter (Signed)
 I called the reading room before lunch. So they should have it done today

## 2023-09-22 NOTE — Telephone Encounter (Signed)
 Reading room notified and will read today

## 2023-09-23 ENCOUNTER — Encounter: Payer: Self-pay | Admitting: Family Medicine

## 2023-10-12 DIAGNOSIS — G500 Trigeminal neuralgia: Secondary | ICD-10-CM | POA: Diagnosis not present

## 2023-10-12 DIAGNOSIS — H9201 Otalgia, right ear: Secondary | ICD-10-CM | POA: Diagnosis not present

## 2023-10-13 DIAGNOSIS — H9191 Unspecified hearing loss, right ear: Secondary | ICD-10-CM | POA: Diagnosis not present

## 2023-10-13 DIAGNOSIS — H9201 Otalgia, right ear: Secondary | ICD-10-CM | POA: Diagnosis not present

## 2023-10-13 DIAGNOSIS — H7491 Unspecified disorder of right middle ear and mastoid: Secondary | ICD-10-CM | POA: Diagnosis not present

## 2023-10-13 DIAGNOSIS — Z8669 Personal history of other diseases of the nervous system and sense organs: Secondary | ICD-10-CM | POA: Diagnosis not present

## 2023-10-13 DIAGNOSIS — H938X1 Other specified disorders of right ear: Secondary | ICD-10-CM | POA: Diagnosis not present

## 2023-10-20 ENCOUNTER — Encounter: Payer: Self-pay | Admitting: Family Medicine

## 2023-10-20 ENCOUNTER — Ambulatory Visit (INDEPENDENT_AMBULATORY_CARE_PROVIDER_SITE_OTHER): Payer: PRIVATE HEALTH INSURANCE | Admitting: Family Medicine

## 2023-10-20 VITALS — BP 124/84 | HR 72 | Temp 97.7°F | Wt 151.0 lb

## 2023-10-20 DIAGNOSIS — Z77018 Contact with and (suspected) exposure to other hazardous metals: Secondary | ICD-10-CM | POA: Diagnosis not present

## 2023-10-20 DIAGNOSIS — Z1389 Encounter for screening for other disorder: Secondary | ICD-10-CM | POA: Diagnosis not present

## 2023-10-20 DIAGNOSIS — Z130 Encounter for screening for diseases of the blood and blood-forming organs and certain disorders involving the immune mechanism: Secondary | ICD-10-CM | POA: Diagnosis not present

## 2023-10-20 NOTE — Progress Notes (Unsigned)
 Alexandria Rush , 1951-01-05, 73 y.o., female MRN: [id number] Patient Care Team    Relationship Specialty Notifications Start End  Natalia Leatherwood, DO PCP - General Family Medicine  08/20/18   Nyoka Cowden, MD Consulting Physician Pulmonary Disease  08/20/18   Janyth Contes, MD    08/20/18    Comment: thoracic surgeon  Vida Rigger, MD Consulting Physician Gastroenterology  08/20/18   Patton Salles, MD Consulting Physician Obstetrics and Gynecology  09/22/22     Chief Complaint  Patient presents with   Durenda Hurt electronic payer ID: [id number] Claims to [facility] in [place], [facility] Labs must go to [employer] under the account [id number] Must be billed as no fault     Subjective: Alexandria Rush is a 73 y.o. 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  [employer] electronic payer ID: [id number] Claims to [facility] in [place], [facility] Labs must go to [place] under the account [id number]  Must be billed as no fault inc plant.     02/02/2023    9:36 AM 06/17/2022    9:33 AM 10/24/2021   10:00 AM 05/28/2021    9:52 AM 06/06/2020    8:22 AM  Depression screen PHQ 2/9  Decreased Interest 0 0 0 0 0  Down, Depressed, Hopeless 0 0 0 0 0  PHQ - 2 Score 0 0 0 0 0    Allergies  Allergen Reactions   Erythromycin Anaphylaxis   Influenza Vaccines Anaphylaxis   Penicillins Anaphylaxis   Phenylephrine Anaphylaxis   Tetracycline Anaphylaxis   Tropicamide Swelling   Benzalkonium Chloride Rash   Fosamax [Alendronate] Nausea Only    Worsened GERD and stomach upset.    Tilactase     Other reaction(s): Wheezing Other reaction(s): Wheezing    Lactose Intolerance (Gi) Swelling    wheezing   Peach [Prunus Persica] Swelling   Peanut Oil     Pt suspects   Pneumococcal Vaccine Other (See Comments)    Pt doesn't take this vaccine as a precaution due to other allergens she has. Other reaction(s): Other (See  Comments) Pt doesn't take this vaccine as a precaution due to other allergens she has.    Wound Dressing Adhesive    Bee Venom Rash   Milk (Cow) Nausea And Vomiting   Neomycin-Bacitracin Zn-Polymyx Rash   Social History   Social History Narrative   Marital status/children/pets: married, 2 children. From [facility]   Education/employment: masters degree. Retired- Sr. Conservator, museum/gallery:      -smoke alarm in the home:Yes     - wears seatbelt: Yes     - Feels safe in their relationships: Yes   Past Medical History:  Diagnosis Date   Anemia    Asthma    Chicken pox    GERD (gastroesophageal reflux disease)    Hypertension    MVP (mitral valve prolapse)    Nasal septal deviation 12/09/2012   Rib fracture 08/27/2017   left 6-9 rib fx- seen of f/u CT- xray was normal   Past Surgical History:  Procedure Laterality Date   ADENOIDECTOMY     APPENDECTOMY     BREAST BIOPSY Left 09/14/2023   [place] LT BREAST BX W LOC DEV 1ST LESION IMG BX SPEC US GUIDE 09/14/2023 [facility]   COLONOSCOPY  2019   HYSTEROSCOPY  NASAL SINUS SURGERY  2014   RIB FRACTURE SURGERY  2019   TONSILLECTOMY     Family History  Problem Relation Age of Onset   Stomach cancer Mother    Cancer Mother    Hypertension Father    Heart disease Father    Stroke Father    Diabetes Brother    Allergies Brother    Asthma Brother    Allergic rhinitis Brother    Diabetes Paternal Aunt    Hypertension Brother    Allergies Sister    Asthma Sister    Diabetes Sister    Allergic rhinitis Sister    Allergies Son    Allergies Daughter    Cancer Maternal Aunt    Stroke Maternal Uncle    Cancer Paternal Aunt        breast   Breast cancer Paternal Aunt    Cancer Maternal Uncle        pancreatic   Allergies as of 10/20/2023       Reactions   Erythromycin Anaphylaxis   Influenza Vaccines Anaphylaxis   Penicillins Anaphylaxis   Phenylephrine Anaphylaxis   Tetracycline Anaphylaxis   Tropicamide Swelling    Benzalkonium Chloride Rash   Fosamax [alendronate] Nausea Only   Worsened GERD and stomach upset.    Tilactase    Other reaction(s): Wheezing Other reaction(s): Wheezing   Lactose Intolerance (gi) Swelling   wheezing   Peach [prunus Persica] Swelling   Peanut Oil    Pt suspects   Pneumococcal Vaccine Other (See Comments)   Pt doesn't take this vaccine as a precaution due to other allergens she has. Other reaction(s): Other (See Comments) Pt doesn't take this vaccine as a precaution due to other allergens she has.   Wound Dressing Adhesive    Bee Venom Rash   Milk (cow) Nausea And Vomiting   Neomycin-bacitracin Zn-polymyx Rash        Medication List        Accurate as of October 20, 2023 11:59 PM. If you have any questions, ask your nurse or doctor.          albuterol 108 (90 Base) MCG/ACT inhaler Commonly known as: Ventolin HFA Inhale 2 puffs into the lungs every 6 (six) hours as needed for wheezing or shortness of breath.   alendronate 70 MG tablet Commonly known as: FOSAMAX Take 1 tablet (70 mg total) by mouth once a week.   amLODipine 5 MG tablet Commonly known as: NORVASC Take 1 tablet (5 mg total) by mouth daily.   Debrox 6.5 % OTIC solution Generic drug: carbamide peroxide Place 5 drops into the right ear 2 (two) times daily.   EPINEPHrine 0.3 mg/0.3 mL Soaj injection Commonly known as: EpiPen 2-Pak Inject 0.3 mg into the muscle as needed for anaphylaxis.   KP Fish Oil 1200 MG Caps Take by mouth.   mupirocin ointment 2 % Commonly known as: BACTROBAN Apply 1 Application topically 3 (three) times daily.   triamcinolone 0.025 % ointment Commonly known as: KENALOG Apply 1 Application topically 2 (two) times daily. Use for 2 weeks at a time for a flare.  Use at night two to three times a week for maintenance dosing.   VITAMIN D3 PO Take by mouth.   Vitamin K1 Powd by Does not apply route.        All past medical history, surgical history,  allergies, family history, immunizations andmedications were updated in the EMR today and reviewed under the history and medication portions of  their EMR.     ROS Negative, with the exception of above mentioned in HPI   Objective:  BP 124/84   Pulse 72   Temp 97.7 F (36.5 C)   Wt 151 lb (68.5 kg)   LMP 07/21/2008 (Approximate)   SpO2 97%   BMI 27.44 kg/m  Body mass index is 27.44 kg/m. Physical Exam Vitals and nursing note reviewed.  Constitutional:      General: She is not in acute distress.    Appearance: Normal appearance. She is not ill-appearing or toxic-appearing.  HENT:     Head: Normocephalic and atraumatic.     Right Ear: Tympanic membrane, ear canal and external ear normal. There is no impacted cerumen.     Left Ear: Tympanic membrane, ear canal and external ear normal. There is no impacted cerumen.     Nose: No congestion or rhinorrhea.     Mouth/Throat:     Mouth: Mucous membranes are moist.     Pharynx: Oropharynx is clear. No oropharyngeal exudate or posterior oropharyngeal erythema.  Eyes:     General: No scleral icterus.       Right eye: No discharge.        Left eye: No discharge.     Extraocular Movements: Extraocular movements intact.     Conjunctiva/sclera: Conjunctivae normal.     Pupils: Pupils are equal, round, and reactive to light.  Cardiovascular:     Rate and Rhythm: Normal rate and regular rhythm.     Pulses: Normal pulses.     Heart sounds: Normal heart sounds. No murmur heard.    No friction rub. No gallop.  Pulmonary:     Effort: Pulmonary effort is normal. No respiratory distress.     Breath sounds: Normal breath sounds. No stridor. No wheezing, rhonchi or rales.  Chest:     Chest wall: No tenderness.  Abdominal:     General: Abdomen is flat. Bowel sounds are normal. There is no distension.     Palpations: Abdomen is soft. There is no mass.     Tenderness: There is no abdominal tenderness. There is no right CVA tenderness, left CVA  tenderness, guarding or rebound.     Hernia: No hernia is present.  Musculoskeletal:        General: No swelling, tenderness or deformity. Normal range of motion.     Cervical back: Normal range of motion and neck supple. No rigidity or tenderness.     Right lower leg: No edema.     Left lower leg: No edema.  Lymphadenopathy:     Cervical: No cervical adenopathy.  Skin:    General: Skin is warm and dry.     Coloration: Skin is not jaundiced or pale.     Findings: No bruising, erythema, lesion or rash.  Neurological:     General: No focal deficit present.     Mental Status: She is alert and oriented to person, place, and time. Mental status is at baseline.     Cranial Nerves: No cranial nerve deficit.     Sensory: No sensory deficit.     Motor: No weakness.     Coordination: Coordination normal.     Gait: Gait normal.     Deep Tendon Reflexes: Reflexes normal.  Psychiatric:        Mood and Affect: Mood normal.        Behavior: Behavior normal.        Thought Content: Thought content normal.  Judgment: Judgment normal.      No results found. No results found. No results found for this or any previous visit (from the past 24 hours).  Assessment/Plan: Alexandria Rush is a 73 y.o. female present for OV for  Screening for blood disease - Comp Met (CMET) - Calcium, urine, random - CBC w/Diff - Lead, blood (adult age 76 yrs or greater) - POC Hemoccult Bld/Stl (3-Cd Home Screen); Future - Uric acid - Urinalysis, Routine w reflex microscopic - Urine Microalbumin w/creat. ratio  Screening for blood or protein in urine - Calcium, urine, random - Urinalysis, Routine w reflex microscopic - Urine Microalbumin w/creat. ratio  Contact with and (suspected) exposure to other hazardous metals - Comp Met (CMET) - Calcium, urine, random - CBC w/Diff - Lead, blood (adult age 41 yrs or greater) - POC Hemoccult Bld/Stl (3-Cd Home Screen); Future - Uric acid - Urinalysis,  Routine w reflex microscopic - Urine Microalbumin w/creat. Ratio  [place] medical monitoring program for chemical exposure by zinc plant.  [employer] electronic payer ID: [id number]  Claims to [facility] in [place], [facility]  Labs must go to [place] under the account [id number]  Must be billed as no fault  Reviewed expectations re: course of current medical issues. Discussed self-management of symptoms. Outlined signs and symptoms indicating need for more acute intervention. Patient verbalized understanding and all questions were answered. Patient received an After-Visit Summary.    Orders Placed This Encounter  Procedures   Comp Met (CMET)   Calcium, urine, random   CBC w/Diff   Uric acid   Urine Microalbumin w/creat. ratio   Lead, blood (adult age 61 yrs or greater)   Urinalysis, Routine w reflex microscopic   POC Hemoccult Bld/Stl (3-Cd Home Screen)   No orders of the defined types were placed in this encounter.  Referral Orders  No referral(s) requested today     Note is dictated utilizing voice recognition software. Although note has been proof read prior to signing, occasional typographical errors still can be missed. If any questions arise, please do not hesitate to call for verification.   electronically signed by:  Felix Pacini, DO  [facility] [place]

## 2023-10-21 ENCOUNTER — Encounter: Payer: Self-pay | Admitting: Family Medicine

## 2023-10-22 LAB — MICROALBUMIN / CREATININE URINE RATIO
Creatinine, Urine: 116.3 mg/dL
Microalb/Creat Ratio: 12 mg/g{creat} (ref 0–29)
Microalbumin, Urine: 13.7 ug/mL

## 2023-10-22 LAB — CBC WITH DIFFERENTIAL/PLATELET
Basophils Absolute: 0 10*3/uL (ref 0.0–0.2)
Basos: 1 %
EOS (ABSOLUTE): 0.1 10*3/uL (ref 0.0–0.4)
Eos: 1 %
Hematocrit: 40 % (ref 34.0–46.6)
Hemoglobin: 13 g/dL (ref 11.1–15.9)
Immature Grans (Abs): 0 10*3/uL (ref 0.0–0.1)
Immature Granulocytes: 0 %
Lymphocytes Absolute: 2.1 10*3/uL (ref 0.7–3.1)
Lymphs: 38 %
MCH: 31.8 pg (ref 26.6–33.0)
MCHC: 32.5 g/dL (ref 31.5–35.7)
MCV: 98 fL — ABNORMAL HIGH (ref 79–97)
Monocytes Absolute: 0.6 10*3/uL (ref 0.1–0.9)
Monocytes: 11 %
Neutrophils Absolute: 2.8 10*3/uL (ref 1.4–7.0)
Neutrophils: 49 %
Platelets: 250 10*3/uL (ref 150–450)
RBC: 4.09 x10E6/uL (ref 3.77–5.28)
RDW: 12.9 % (ref 11.7–15.4)
WBC: 5.7 10*3/uL (ref 3.4–10.8)

## 2023-10-22 LAB — COMPREHENSIVE METABOLIC PANEL WITH GFR
ALT: 7 IU/L (ref 0–32)
AST: 12 IU/L (ref 0–40)
Albumin: 4.4 g/dL (ref 3.8–4.8)
Alkaline Phosphatase: 89 IU/L (ref 44–121)
BUN/Creatinine Ratio: 15 (ref 12–28)
BUN: 12 mg/dL (ref 8–27)
Bilirubin Total: 0.3 mg/dL (ref 0.0–1.2)
CO2: 21 mmol/L (ref 20–29)
Calcium: 9.3 mg/dL (ref 8.7–10.3)
Chloride: 106 mmol/L (ref 96–106)
Creatinine, Ser: 0.81 mg/dL (ref 0.57–1.00)
Globulin, Total: 2.3 g/dL (ref 1.5–4.5)
Glucose: 86 mg/dL (ref 70–99)
Potassium: 4.3 mmol/L (ref 3.5–5.2)
Sodium: 143 mmol/L (ref 134–144)
Total Protein: 6.7 g/dL (ref 6.0–8.5)
eGFR: 78 mL/min/{1.73_m2} (ref >59)

## 2023-10-22 LAB — LEAD, BLOOD (ADULT >= 16 YRS)

## 2023-10-22 LAB — CALCIUM, URINE, RANDOM: Calcium, Urine: 10.8 mg/dL

## 2023-10-22 LAB — URIC ACID: Uric Acid: 3.7 mg/dL (ref 3.1–7.9)

## 2023-10-23 ENCOUNTER — Encounter: Payer: Self-pay | Admitting: Family Medicine

## 2023-12-08 ENCOUNTER — Ambulatory Visit: Payer: Medicare PPO | Admitting: Family Medicine

## 2023-12-08 ENCOUNTER — Encounter: Payer: Self-pay | Admitting: Family Medicine

## 2023-12-08 VITALS — BP 122/74 | HR 68 | Temp 97.9°F | Wt 148.6 lb

## 2023-12-08 DIAGNOSIS — G509 Disorder of trigeminal nerve, unspecified: Secondary | ICD-10-CM | POA: Diagnosis not present

## 2023-12-08 DIAGNOSIS — H9201 Otalgia, right ear: Secondary | ICD-10-CM

## 2023-12-08 DIAGNOSIS — I10 Essential (primary) hypertension: Secondary | ICD-10-CM | POA: Diagnosis not present

## 2023-12-08 DIAGNOSIS — M810 Age-related osteoporosis without current pathological fracture: Secondary | ICD-10-CM | POA: Diagnosis not present

## 2023-12-08 MED ORDER — AMLODIPINE BESYLATE 5 MG PO TABS: 5.0000 mg | ORAL_TABLET | Freq: Every day | ORAL | 1 refills | Status: DC

## 2023-12-08 MED ORDER — AMITRIPTYLINE HCL 10 MG PO TABS: 10.0000 mg | ORAL_TABLET | Freq: Every day | ORAL | 5 refills | Status: DC

## 2023-12-08 NOTE — Progress Notes (Signed)
 Patient ID: Alexandria Rush, female  DOB: [birth year], 73 y.o.   MRN: [id number] Patient Care Team    Relationship Specialty Notifications Start End  Mariel Shope, DO PCP - General Family Medicine  08/20/18   Diamond Formica, MD Consulting Physician Pulmonary Disease  08/20/18   Jillene Motley, MD    08/20/18    Comment: thoracic surgeon  Ozell Blunt, MD Consulting Physician Gastroenterology  08/20/18   Greta Leatherwood, MD Consulting Physician Obstetrics and Gynecology  09/22/22     Chief Complaint  Patient presents with   Hypertension    Subjective: Alexandria Rush is a 73 y.o.  Female  present for Chronic Conditions/illness Management  . All past medical history, surgical history, allergies, family history, immunizations, medications and social history were updated in the electronic medical record today. All recent labs, ED visits and hospitalizations within the last year were reviewed.   Hypertension:  Pt reports compliance with amlodipine  5 mg daily.  Patient denies chest pain, shortness of breath, dizziness or lower extremity edema.  Pt does not take a daily baby ASA. Pt is is not prescribed statin. Diet: Low-sodium Exercise: Routine exercise.   Thinning of the right tegmen tympani  MRI brain:  1.  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 There is also mild atrophy evident of this nerve.  2.  No cephalocele.  3.  No acute or subacute intracranial abnormalities. Mild chronic small vessel disease.   MRI cervical and thoracic spine:  1.  Multilevel degenerative changes of cervical spine with mild spinal canal narrowing C4-C5, C5-C6, C6-C7. No high-grade neural foraminal stenosis.  2.  Minimal prominence of the central ependymal canal of the spinal cord at C6-C7 and T7-T12, of doubtful clinical significance.   Ear pain, right/trigeminal nerve abnormality Patient has had right ear pain for quite some time,  about 1 year.  She has been seen by Willy Harvest.  She had CT and MRI completed 09/2023, showing thinning of the right tegmen tympani.  MRI brain resulted with right superior cerebellar artery contacts and deforms the cisternal segment of the right trigeminal nerve, resulting in mild atrophy.  She is needing assistance acquiring local specialty teams to address the issues.  These results were found in care everywhere tab       No data to display           Immunization History  Administered Date(s) Administered   PFIZER(Purple Top)SARS-COV-2 Vaccination 09/16/2019, 10/12/2019, 04/23/2020   Tdap 08/21/2013   Past Medical History:  Diagnosis Date   Anemia    Asthma    Chicken pox    Elevated alkaline phosphatase level 06/27/2019   Elevated MCV 06/08/2023   GERD (gastroesophageal reflux disease)    Hypertension    MVP (mitral valve prolapse)    Nasal septal deviation 12/09/2012   Other microscopic hematuria 09/22/2018   Rib fracture 08/27/2017   left 6-9 rib fx- seen of f/u CT- xray was normal   Allergies  Allergen Reactions   Erythromycin Anaphylaxis   Influenza Vaccines Anaphylaxis   Penicillins Anaphylaxis   Phenylephrine Anaphylaxis   Tetracycline Anaphylaxis   Tilactase Shortness Of Breath    Other reaction(s): Wheezing  Other reaction(s): Wheezing Other reaction(s): Wheezing    Other reaction(s): Wheezing Other reaction(s): Wheezing   Tropicamide Swelling   Benzalkonium Chloride Rash   Lactose Intolerance (Gi) Swelling    wheezing   Peach [  Prunus Persica] Swelling   Peanut Oil     Pt suspects   Pneumococcal Vaccine Other (See Comments)    Pt doesn't take this vaccine as a precaution due to other allergens she has. Other reaction(s): Other (See Comments) Pt doesn't take this vaccine as a precaution due to other allergens she has.    Wound Dressing Adhesive    Bee Venom Rash   Milk (Cow) Nausea And Vomiting   Neomycin-Bacitracin Zn-Polymyx Rash   Past  Surgical History:  Procedure Laterality Date   ADENOIDECTOMY     APPENDECTOMY     BREAST BIOPSY Left 09/14/2023   US  LT BREAST BX W LOC DEV 1ST LESION IMG BX SPEC US  GUIDE 09/14/2023 [facility]MO[facility]   COLONOSCOPY  2019   HYSTEROSCOPY     NASAL SINUS SURGERY  2014   RIB FRACTURE SURGERY  2019   TONSILLECTOMY     Family History  Problem Relation Age of Onset   Stomach cancer Mother    Cancer Mother    Hypertension Father    Heart disease Father    Stroke Father    Diabetes Brother    Allergies Brother    Asthma Brother    Allergic rhinitis Brother    Diabetes Paternal Aunt    Hypertension Brother    Allergies Sister    Asthma Sister    Diabetes Sister    Allergic rhinitis Sister    Allergies Son    Allergies Daughter    Cancer Maternal Aunt    Stroke Maternal Uncle    Cancer Paternal Aunt        breast   Breast cancer Paternal Aunt    Cancer Maternal Uncle        pancreatic   Social History   Social History Narrative   Marital status/children/pets: married, 2 children. From [facility]   Education/employment: masters degree. Retired- Sr. Conservator, museum/gallery:      -smoke alarm in the home:Yes     - wears seatbelt: Yes     - Feels safe in their relationships: Yes    Allergies as of 12/08/2023       Reactions   Erythromycin Anaphylaxis   Influenza Vaccines Anaphylaxis   Penicillins Anaphylaxis   Phenylephrine Anaphylaxis   Tetracycline Anaphylaxis   Tilactase Shortness Of Breath   Other reaction(s): Wheezing Other reaction(s): Wheezing Other reaction(s): Wheezing    Other reaction(s): Wheezing Other reaction(s): Wheezing   Tropicamide Swelling   Benzalkonium Chloride Rash   Lactose Intolerance (gi) Swelling   wheezing   Peach [prunus Persica] Swelling   Peanut Oil    Pt suspects   Pneumococcal Vaccine Other (See Comments)   Pt doesn't take this vaccine as a precaution due to other allergens she has. Other reaction(s): Other (See Comments) Pt doesn't  take this vaccine as a precaution due to other allergens she has.   Wound Dressing Adhesive    Bee Venom Rash   Milk (cow) Nausea And Vomiting   Neomycin-bacitracin Zn-polymyx Rash        Medication List        Accurate as of Dec 08, 2023 11:59 PM. If you have any questions, ask your nurse or doctor.          albuterol  108 (90 Base) MCG/ACT inhaler Commonly known as: Ventolin  HFA Inhale 2 puffs into the lungs every 6 (six) hours as needed for wheezing or shortness of breath.   alendronate  70 MG tablet Commonly known as: FOSAMAX   Take 1 tablet (70 mg total) by mouth once a week.   amitriptyline 10 MG tablet Commonly known as: ELAVIL Take 1-3 tablets (10-30 mg total) by mouth at bedtime. Started by: Napolean Backbone   amLODipine  5 MG tablet Commonly known as: NORVASC  Take 1 tablet (5 mg total) by mouth daily.   Debrox 6.5 % OTIC solution Generic drug: carbamide peroxide Place 5 drops into the right ear 2 (two) times daily.   EPINEPHrine  0.3 mg/0.3 mL Soaj injection Commonly known as: EpiPen  2-Pak Inject 0.3 mg into the muscle as needed for anaphylaxis.   KP Fish Oil 1200 MG Caps Take by mouth.   mupirocin  ointment 2 % Commonly known as: BACTROBAN  Apply 1 Application topically 3 (three) times daily.   triamcinolone  0.025 % ointment Commonly known as: KENALOG  Apply 1 Application topically 2 (two) times daily. Use for 2 weeks at a time for a flare.  Use at night two to three times a week for maintenance dosing.   VITAMIN D3 PO Take by mouth.   Vitamin K1 Powd by Does not apply route.        All past medical history, surgical history, allergies, family history, immunizations andmedications were updated in the EMR today and reviewed under the history and medication portions of their EMR.       ROS: 14 pt review of systems performed and negative (unless mentioned in an HPI)  Objective: BP 122/74   Pulse 68   Temp 97.9 F (36.6 C)   Wt 148 lb 9.6 oz (67.4  kg)   LMP 07/21/2008 (Approximate)   SpO2 98%   BMI 27.00 kg/m  Physical Exam Vitals and nursing note reviewed.  Constitutional:      General: She is not in acute distress.    Appearance: Normal appearance. She is not ill-appearing, toxic-appearing or diaphoretic.  HENT:     Head: Normocephalic and atraumatic.  Eyes:     General: No scleral icterus.       Right eye: No discharge.        Left eye: No discharge.     Extraocular Movements: Extraocular movements intact.     Conjunctiva/sclera: Conjunctivae normal.     Pupils: Pupils are equal, round, and reactive to light.  Cardiovascular:     Rate and Rhythm: Normal rate and regular rhythm.  Pulmonary:     Effort: Pulmonary effort is normal. No respiratory distress.     Breath sounds: Normal breath sounds. No wheezing, rhonchi or rales.  Musculoskeletal:     Right lower leg: No edema.     Left lower leg: No edema.  Skin:    General: Skin is warm.     Findings: No rash.  Neurological:     Mental Status: She is alert and oriented to person, place, and time. Mental status is at baseline.     Motor: No weakness.     Gait: Gait normal.  Psychiatric:        Mood and Affect: Mood normal.        Behavior: Behavior normal.        Thought Content: Thought content normal.        Judgment: Judgment normal.     No results found.  Assessment/plan: Alexandria Rush is a 72 y.o. female present for chronic condition management Benign essential HTN/(BMI 25.0-29.9) Stable Continue amlodipine  5 mg every day- refilled Labs UTD 10/2023  Osteoporosis without current pathological fracture, unspecified osteoporosis type Vit d collected UTD 06/2023 Dexa UTD  08/26/2023 -3.2 T-score Continue Fosamax  weekly  Mild intermittent extrinsic asthma without status asthmaticus without complication Stable Continue albuterol  prn- refilled  Ear pain/trigeminal nerve abnormality We discussed different options today to assist her in obtaining local  specialty teams for treatment.  It sounds like she may require neurosurgeon to evaluate for possible trigeminal nerve decompression. She also would likely local ENT consult as well. Referral placed to both ENT and neurosurgery today We discussed start of Elavil nightly 10-30 mg tapering to hopefully help with pain.  We can certainly increase dose if needed at follow-up, if she is tolerating.  Return in about 7 months (around 06/23/2024) for cpe (20 min), Routine chronic condition follow-up.  Orders Placed This Encounter  Procedures   Ambulatory referral to ENT   Ambulatory referral to Neurosurgery   Meds ordered this encounter  Medications   amLODipine  (NORVASC ) 5 MG tablet    Sig: Take 1 tablet (5 mg total) by mouth daily.    Dispense:  90 tablet    Refill:  1   amitriptyline (ELAVIL) 10 MG tablet    Sig: Take 1-3 tablets (10-30 mg total) by mouth at bedtime.    Dispense:  90 tablet    Refill:  5    Referral Orders         Ambulatory referral to ENT         Ambulatory referral to Neurosurgery       Electronically signed by: Napolean Backbone, DO [facility] [place]

## 2023-12-08 NOTE — Patient Instructions (Addendum)
 Return in about 7 months (around 06/23/2024) for cpe (20 min), Routine chronic condition follow-up.  [email or web address]   Start medication 1 tab nightly. You can increase by 1 tab nightly if needed up to 30 mg. If still not helping, we can increase further on follow up if needed.       Great to see you today.  I have refilled the medication(s) we provide.   If labs were collected or images ordered, we will inform you of  results once we have received them and reviewed. We will contact you either by echart message, or telephone call.  Please give ample time to the testing facility, and our office to run,  receive and review results. Please do not call inquiring of results, even if you can see them in your chart. We will contact you as soon as we are able. If it has been over 1 week since the test was completed, and you have not yet heard from us , then please call us .    - echart message- for normal results that have been seen by the patient already.   - telephone call: abnormal results or if patient has not viewed results in their echart.  If a referral to a specialist was entered for you, please call us  in 2 weeks if you have not heard from the specialist office to schedule.

## 2023-12-10 DIAGNOSIS — G509 Disorder of trigeminal nerve, unspecified: Secondary | ICD-10-CM | POA: Insufficient documentation

## 2024-01-12 ENCOUNTER — Ambulatory Visit: Admitting: Obstetrics and Gynecology

## 2024-01-12 ENCOUNTER — Encounter: Payer: Self-pay | Admitting: Obstetrics and Gynecology

## 2024-01-12 VITALS — BP 124/78 | HR 78

## 2024-01-12 DIAGNOSIS — L089 Local infection of the skin and subcutaneous tissue, unspecified: Secondary | ICD-10-CM

## 2024-01-12 DIAGNOSIS — Z8619 Personal history of other infectious and parasitic diseases: Secondary | ICD-10-CM | POA: Diagnosis not present

## 2024-01-12 DIAGNOSIS — L723 Sebaceous cyst: Secondary | ICD-10-CM

## 2024-01-12 DIAGNOSIS — B009 Herpesviral infection, unspecified: Secondary | ICD-10-CM | POA: Diagnosis not present

## 2024-01-12 DIAGNOSIS — N9089 Other specified noninflammatory disorders of vulva and perineum: Secondary | ICD-10-CM

## 2024-01-12 MED ORDER — SULFAMETHOXAZOLE-TRIMETHOPRIM 800-160 MG PO TABS: ORAL_TABLET | ORAL | 0 refills | Status: DC

## 2024-01-12 NOTE — Progress Notes (Signed)
 GYNECOLOGY  VISIT   HPI: 73 y.o.   Married  Caucasian female   G2P2 with Patient's last menstrual period was 07/21/2008 (approximate).   here for: Bump on/near her R labia, does not itch just hurts. 7/10 pain level. Bump has been there since 01/10/24, has not grown in size. Has been taking 800mg  Advil.  Tried an ice pack.  Not had this before.  Triamcinolone  made it burn.   Not draining.    She tried to squeeze it.   No fevers.   Urine on the area causes pain.      Has oral HSV.   GYNECOLOGIC HISTORY: Patient's last menstrual period was 07/21/2008 (approximate). 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   Right ear pain 09/15/2023   Calcium oxalate crystals in urine 06/08/2023   Toenail fungus 05/08/2022   Multiple drug allergies 02/28/2020   Adverse food reaction 02/28/2020   Osteoporosis 06/06/2019   (BMI 25.0-29.9) 05/26/2019   Contact with and (suspected) exposure to other hazardous metals 09/22/2018   Fibromyalgia 09/09/2018   GERD (gastroesophageal reflux disease) 08/20/2018   Asthma, extrinsic, without status asthmaticus 08/20/2018   Benign essential HTN 08/20/2018   Fothergill's neuralgia (Trigeminal) 08/20/2018   Peptic esophagitis 08/20/2018   Allergic rhinitis due to allergen 08/20/2018   Upper airway cough syndrome 12/02/2013    Past Medical History:  Diagnosis Date   Anemia    Asthma    Chicken pox    Elevated alkaline phosphatase level 06/27/2019   Elevated MCV 06/08/2023   GERD  (gastroesophageal reflux disease)    Hypertension    MVP (mitral valve prolapse)    Nasal septal deviation 12/09/2012   Other microscopic hematuria 09/22/2018   Rib fracture 08/27/2017   left 6-9 rib fx- seen of f/u CT- xray was normal    Past Surgical History:  Procedure Laterality Date   ADENOIDECTOMY     APPENDECTOMY     BREAST BIOPSY Left 09/14/2023   US  LT BREAST BX W LOC DEV 1ST LESION IMG BX SPEC US  GUIDE 09/14/2023 [facility]   COLONOSCOPY  2019   HYSTEROSCOPY     NASAL SINUS SURGERY  2014   RIB FRACTURE SURGERY  2019   TONSILLECTOMY      Current Outpatient Medications  Medication Sig Dispense Refill   albuterol  (VENTOLIN  HFA) 108 (90 Base) MCG/ACT inhaler Inhale 2 puffs into the lungs every 6 (six) hours as needed for wheezing or shortness of breath. 8 g 11   alendronate  (FOSAMAX ) 70 MG tablet Take 1 tablet (70 mg total) by mouth once a week. 4 tablet 11   amitriptyline  (ELAVIL ) 10 MG tablet Take 1-3 tablets (10-30 mg  total) by mouth at bedtime. 90 tablet 5   amLODipine  (NORVASC ) 5 MG tablet Take 1 tablet (5 mg total) by mouth daily. 90 tablet 1   Cholecalciferol (VITAMIN D3 PO) Take by mouth.     EPINEPHrine  (EPIPEN  2-PAK) 0.3 mg/0.3 mL IJ SOAJ injection Inject 0.3 mg into the muscle as needed for anaphylaxis. 1 each 0   mupirocin  ointment (BACTROBAN ) 2 % Apply 1 Application topically 3 (three) times daily. 22 g 0   Omega-3 Fatty Acids (KP FISH OIL) 1200 MG CAPS Take by mouth.     Phytonadione (VITAMIN K1) POWD by Does not apply route.     triamcinolone  (KENALOG ) 0.025 % ointment Apply 1 Application topically 2 (two) times daily. Use for 2 weeks at a time for a flare.  Use at night two to three times a week for maintenance dosing. 30 g 1   carbamide peroxide (DEBROX) 6.5 % OTIC solution Place 5 drops into the right ear 2 (two) times daily. (Patient not taking: Reported on 01/12/2024) 15 mL 1   No current facility-administered medications for this visit.      ALLERGIES: Erythromycin, Influenza vaccines, Penicillins, Phenylephrine, Tetracycline, Tilactase, Tropicamide, Benzalkonium chloride, Lactose intolerance (gi), Peach [prunus persica], Peanut oil, Pneumococcal vaccine, Wound dressing adhesive, Bee venom, Milk (cow), and Neomycin-bacitracin zn-polymyx  Family History  Problem Relation Age of Onset   Stomach cancer Mother    Cancer Mother    Hypertension Father    Heart disease Father    Stroke Father    Diabetes Brother    Allergies Brother    Asthma Brother    Allergic rhinitis Brother    Diabetes Paternal Aunt    Hypertension Brother    Allergies Sister    Asthma Sister    Diabetes Sister    Allergic rhinitis Sister    Allergies Son    Allergies Daughter    Cancer Maternal Aunt    Stroke Maternal Uncle    Cancer Paternal Aunt        breast   Breast cancer Paternal Aunt    Cancer Maternal Uncle        pancreatic    Social History   Socioeconomic History   Marital status: Married    Spouse name: Not on file   Number of children: Not on file   Years of education: Not on file   Highest education level: Master's degree (e.g., MA, MS, MEng, MEd, MSW, MBA)  Occupational History   Occupation: Teaching laboratory technician: [employer]  Tobacco Use   Smoking status: Never   Smokeless tobacco: Never  Vaping Use   Vaping status: Never Used  Substance and Sexual Activity   Alcohol use: Not Currently   Drug use: No   Sexual activity: Yes    Birth control/protection: Post-menopausal    Comment: 1st intercourse 73 yo-Fewer than 5 partners  Other Topics Concern   Not on file  Social History Narrative   Marital status/children/pets: married, 2 children. From [facility]   Education/employment: masters degree. Retired- Sr. Conservator, museum/gallery:      -smoke alarm in the home:Yes     - wears seatbelt: Yes     - Feels safe in their relationships: Yes   Social Drivers of Corporate investment banker Strain: Low Risk   (08/17/2023)   Overall Financial Resource Strain (CARDIA)    Difficulty of Paying Living Expenses: Not very hard  Food Insecurity: No Food Insecurity (08/17/2023)  Hunger Vital Sign    Worried About Running Out of Food in the Last Year: Never true    Ran Out of Food in the Last Year: Never true  Transportation Needs: No Transportation Needs (08/17/2023)   PRAPARE - Administrator, Civil Service (Medical): No    Lack of Transportation (Non-Medical): No  Physical Activity: Sufficiently Active (08/17/2023)   Exercise Vital Sign    Days of Exercise per Week: 7 days    Minutes of Exercise per Session: 120 min  Stress: No Stress Concern Present (08/17/2023)   [employer] of Occupational Health - Occupational Stress Questionnaire    Feeling of Stress : Only a little  Social Connections: Moderately Integrated (08/17/2023)   Social Connection and Isolation Panel    Frequency of Communication with Friends and Family: Three times a week    Frequency of Social Gatherings with Friends and Family: Twice a week    Attends Religious Services: More than 4 times per year    Active Member of [employer] or Organizations: No    Attends Engineer, structural: Not on file    Marital Status: Married  Recent Concern: Social Connections - Moderately Isolated (06/04/2023)   Social Connection and Isolation Panel    Frequency of Communication with Friends and Family: Once a week    Frequency of Social Gatherings with Friends and Family: Once a week    Attends Religious Services: More than 4 times per year    Active Member of [employer] or Organizations: No    Attends Banker Meetings: Not on file    Marital Status: Married  Catering manager Violence: Not At Risk (06/06/2020)   Humiliation, Afraid, Rape, and Kick questionnaire    Fear of Current or Ex-Partner: No    Emotionally Abused: No    Physically Abused: No    Sexually Abused: No    Review of Systems  All other systems  reviewed and are negative.   PHYSICAL EXAMINATION:   BP 124/78 (BP Location: Left Arm, Patient Position: Sitting)   Pulse 78   LMP 07/21/2008 (Approximate)   SpO2 98%     General appearance: alert, cooperative and appears stated age   Pelvic: External genitalia:  right labia majora with ulcerative area versus opening of sebaceous cyst, raised skin and erythema surrounding it.              Urethra:  normal appearing urethra with no masses, tenderness or lesions              Bartholins and Skenes: normal                 Vagina: normal appearing vagina with normal color and discharge, no lesions              Cervix: no lesions                Bimanual Exam:  Uterus:  normal size, contour, position, consistency, mobility, non-tender              Adnexa: no mass, fullness, tenderness             Chaperone was present for exam:  Clotilda DEL, CMA  ASSESSMENT:  Infected sebaceous cyst versus vulvar lesion.  Hx oral HSV.  PLAN:  Sebaceous cysts discussed. Bactrim  DS po bid x 7 days.  OTC ibuprofen 800 mg po q 8 hours prn.   Ice pack or warm compress prn.  Soap and water good  for cleansing.  Wound culture performed.  HSV I and II PCR collected with patient permission.  FU if area does not improve.  28 min  total time was spent for this patient encounter, including preparation, face-to-face counseling with the patient, coordination of care, and documentation of the encounter.

## 2024-01-12 NOTE — Patient Instructions (Signed)
 Pocket of Fluid in the Skin (Epidermoid Cyst): What to Know  An epidermoid cyst is a small lump under your skin. The cyst contains a substance that is thick and oily. What are the causes? A blocked hair follicle. A hair curls and re-enters the skin instead of growing straight out of the skin. A blocked pore. Irritated skin. An injury to the skin. Certain conditions that are passed from parent to child. Human papillomavirus (HPV). This happens rarely when cysts occur on the bottom of the feet. Long-term sun damage to the skin. What increases the risk? Having acne. Being female. Having an injury to the skin. Being past puberty. Certain conditions that are passed down through family (genetic disorder). What are the signs or symptoms? The only sign of this type of cyst may be a small, painless lump under the skin. These cysts are usually painless, but they can get infected. Symptoms of infection may include: Redness. Inflammation. Tenderness. Warmth. Fever. A bad-smelling substance that drains from the cyst. Pus that drains from the cyst. How is this treated? If a cyst becomes inflamed, treatment may include: Opening and draining the cyst. Antibiotics. Shots of medicines called steroids that help lessen inflammation. Surgery to remove the cyst if it's large, painful, or could turn into cancer. Do not try to open or squeeze a cyst yourself. Follow these instructions at home: Medicines Take your medicines only as told. If you were given antibiotics, take them as told. Do not stop taking them even if you start to feel better. General instructions Keep the area around your cyst clean and dry. Wear loose, dry clothing. Avoid touching your cyst. Check your cyst every day for signs of infection. Check for: Redness, swelling, or pain. Fluid or blood. Warmth. Pus or a bad smell. Keep all follow-up visits to make sure there's no discomfort or infection. Contact a doctor if: You have  any signs of infection. Your cyst doesn't get better or gets worse. You get a cyst that looks different from other cysts you've had. You have a fever. You have redness that spreads from the cyst. This information is not intended to replace advice given to you by your health care provider. Make sure you discuss any questions you have with your health care provider. Document Revised: 02/20/2023 Document Reviewed: 02/20/2023 Elsevier Patient Education  2024 [employer].

## 2024-01-14 ENCOUNTER — Ambulatory Visit: Payer: Self-pay | Admitting: Obstetrics and Gynecology

## 2024-01-14 DIAGNOSIS — M62838 Other muscle spasm: Secondary | ICD-10-CM | POA: Diagnosis not present

## 2024-01-14 DIAGNOSIS — H9311 Tinnitus, right ear: Secondary | ICD-10-CM | POA: Diagnosis not present

## 2024-01-14 DIAGNOSIS — H6991 Unspecified Eustachian tube disorder, right ear: Secondary | ICD-10-CM | POA: Diagnosis not present

## 2024-01-14 DIAGNOSIS — H9191 Unspecified hearing loss, right ear: Secondary | ICD-10-CM | POA: Diagnosis not present

## 2024-01-14 LAB — SURESWAB HSV, TYPE 1/2 DNA, PCR
HSV 1 DNA: NOT DETECTED
HSV 2 DNA: NOT DETECTED

## 2024-01-15 LAB — WOUND CULTURE
MICRO NUMBER:: 16618122
SPECIMEN QUALITY:: ADEQUATE

## 2024-02-25 DIAGNOSIS — J3489 Other specified disorders of nose and nasal sinuses: Secondary | ICD-10-CM | POA: Diagnosis not present

## 2024-02-25 DIAGNOSIS — H6991 Unspecified Eustachian tube disorder, right ear: Secondary | ICD-10-CM | POA: Diagnosis not present

## 2024-02-25 DIAGNOSIS — R0982 Postnasal drip: Secondary | ICD-10-CM | POA: Diagnosis not present

## 2024-03-07 ENCOUNTER — Ambulatory Visit: Payer: Self-pay | Admitting: Family Medicine

## 2024-03-07 NOTE — Telephone Encounter (Signed)
 FYI Only or Action Required?: FYI only for provider.  Patient was last seen in primary care on 12/08/2023 by Catherine Fuller A, DO.  Called Nurse Triage reporting Nasal Congestion.  Symptoms began today.  Interventions attempted: OTC medications: Saline solution in nose, Mucinex.  Symptoms are: unchanged.  Triage Disposition: See HCP Within 4 Hours (Or PCP Triage)  Patient/caregiver understands and will follow disposition?: No             Copied from CRM #[id number]. Topic: Clinical - Red Word Triage >> Mar 07, 2024  2:01 PM Rosina BIRCH wrote: Red Word that prompted transfer to Nurse Triage: sinus issues and trouble breathing Reason for Disposition  [1] SEVERE sinus pain (e.g., excruciating) AND [2] not improved 2 hours after pain medicine  Answer Assessment - Initial Assessment Questions This RN tried to get pt scheduled with urgent care but pt refused stating she only wants to see her provider. Pt scheduled for first available appt with PCP on Wed, 8/20, in office. This RN educated pt on new-worsening symptoms and when to call back/seek emergent care. Pt verbalized understanding and agrees to plan.     LOCATION: Where does it hurt?      Sinus pressure   ONSET: When did the sinus pain start?  (e.g., hours, days)      This morning  SEVERITY: How bad is the pain?   (Scale 0-10; or none, mild, moderate or severe)     8/10 pain, primarily constant  RECURRENT SYMPTOM: Have you ever had sinus problems before? If Yes, ask: When was the last time? and What happened that time?     Yes, when had pneumonia of Feb 2025  NASAL CONGESTION: Is the nose blocked? If Yes, ask: Can you open it or must you breathe through your mouth?     Not blocked; yes can breathe through  NASAL DISCHARGE: Do you have discharge from your nose? If so ask, What color?     White mucus, stringy  FEVER: Do you have a fever? If Yes, ask: What is it, how was it measured, and when did  it start?      No, low-grade, 99 F-100 F  OTHER SYMPTOMS: Do you have any other symptoms? (e.g., sore throat, cough, earache, difficulty breathing)     In between shoulder blade pain around lungs  Protocols used: Sinus Pain or Congestion-A-AH

## 2024-03-09 ENCOUNTER — Encounter: Payer: Self-pay | Admitting: Family Medicine

## 2024-03-09 ENCOUNTER — Ambulatory Visit: Admitting: Family Medicine

## 2024-03-09 VITALS — BP 120/80 | HR 81 | Temp 98.5°F | Wt 149.2 lb

## 2024-03-09 DIAGNOSIS — B9689 Other specified bacterial agents as the cause of diseases classified elsewhere: Secondary | ICD-10-CM | POA: Diagnosis not present

## 2024-03-09 DIAGNOSIS — R0981 Nasal congestion: Secondary | ICD-10-CM | POA: Diagnosis not present

## 2024-03-09 DIAGNOSIS — R062 Wheezing: Secondary | ICD-10-CM

## 2024-03-09 DIAGNOSIS — J329 Chronic sinusitis, unspecified: Secondary | ICD-10-CM | POA: Diagnosis not present

## 2024-03-09 DIAGNOSIS — J4531 Mild persistent asthma with (acute) exacerbation: Secondary | ICD-10-CM

## 2024-03-09 LAB — POC COVID19 BINAXNOW: SARS Coronavirus 2 Ag: NEGATIVE

## 2024-03-09 MED ORDER — METHYLPREDNISOLONE ACETATE 80 MG/ML IJ SUSP
80.0000 mg | Freq: Once | INTRAMUSCULAR | Status: AC
  Administered 2024-03-09: 80 mg via INTRAMUSCULAR

## 2024-03-09 MED ORDER — LEVOFLOXACIN 500 MG PO TABS: 500.0000 mg | ORAL_TABLET | Freq: Every day | ORAL | 0 refills | Status: AC

## 2024-03-09 MED ORDER — IPRATROPIUM-ALBUTEROL 0.5-2.5 (3) MG/3ML IN SOLN
3.0000 mL | Freq: Once | RESPIRATORY_TRACT | Status: AC
  Administered 2024-03-09: 3 mL via RESPIRATORY_TRACT

## 2024-03-09 MED ORDER — PREDNISONE 20 MG PO TABS: ORAL_TABLET | ORAL | 0 refills | Status: DC

## 2024-03-09 NOTE — Patient Instructions (Addendum)

## 2024-03-09 NOTE — Progress Notes (Signed)
 Alexandria Rush , 07/05/1951, 73 y.o., female MRN: [id number] Patient Care Team    Relationship Specialty Notifications Start End  [name] LABOR, DO PCP - General Family Medicine  08/20/18   [name] NOVAK, MD Consulting Physician Pulmonary Disease  08/20/18   [name], MD    08/20/18    Comment: thoracic surgeon  [name], MD Consulting Physician Gastroenterology  08/20/18   [name], MD Consulting Physician Obstetrics and Gynecology  09/22/22     Chief Complaint  Patient presents with   Sinus congestion    Pt has been having nasal congestion along with pain and pressure near her lungs when she coughs since Monday. She has also had a slight fever and has been very fatigued. She has tried Mucinex 12 hour, advil and also used her nebulizer which has helped some.     Subjective: Alexandria Rush is a 73 y.o. Pt presents for an OV with complaints of sinus congestion of 3-5 days duration.  Associated symptoms include chest discomfort, fatigue, ow grade fever and wheezing. Last neb tx last night.  Pt has tried mucinex to ease their symptoms.  (See ROS)     12/08/2023    1:26 PM 02/02/2023    9:36 AM 06/17/2022    9:33 AM 10/24/2021   10:00 AM 05/28/2021    9:52 AM  Depression screen PHQ 2/9  Decreased Interest 0 0 0 0 0  Down, Depressed, Hopeless 0 0 0 0 0  PHQ - 2 Score 0 0 0 0 0    Allergies  Allergen Reactions   Erythromycin Anaphylaxis   Influenza Vaccines Anaphylaxis   Penicillins Anaphylaxis   Phenylephrine Anaphylaxis   Tetracycline Anaphylaxis   Tilactase Shortness Of Breath    Other reaction(s): Wheezing  Other reaction(s): Wheezing Other reaction(s): Wheezing    Other reaction(s): Wheezing Other reaction(s): Wheezing   Tropicamide Swelling   Benzalkonium Chloride Rash   Lactose Intolerance (Gi) Swelling    wheezing   Peach [Prunus Persica] Swelling   Peanut Oil     Pt suspects   Pneumococcal Vaccine Other (See Comments)    Pt  doesn't take this vaccine as a precaution due to other allergens she has. Other reaction(s): Other (See Comments) Pt doesn't take this vaccine as a precaution due to other allergens she has.    Wound Dressing Adhesive    Bee Venom Rash   Milk (Cow) Nausea And Vomiting   Neomycin-Bacitracin Zn-Polymyx Rash   Social History   Social History Narrative   Marital status/children/pets: married, 2 children. From [facility]   Education/employment: masters degree. Retired- Sr. Conservator, museum/gallery:      -smoke alarm in the home:Yes     - wears seatbelt: Yes     - Feels safe in their relationships: Yes   Past Medical History:  Diagnosis Date   Allergy  1971   Anemia 1965   Asthma    Chicken pox    Elevated alkaline phosphatase level 06/27/2019   Elevated MCV 06/08/2023   GERD (gastroesophageal reflux disease)    Hypertension 2020   MVP (mitral valve prolapse)    Nasal septal deviation 12/09/2012   Other microscopic hematuria 09/22/2018   Rib fracture 08/27/2017   left 6-9 rib fx- seen of f/u CT- xray was normal   Past Surgical History:  Procedure Laterality Date   ADENOIDECTOMY     APPENDECTOMY  1972   BREAST BIOPSY  Left 09/14/2023   US  LT BREAST BX W LOC DEV 1ST LESION IMG BX SPEC US  GUIDE 09/14/2023 [facility]   COLONOSCOPY  2019   HYSTEROSCOPY     NASAL SINUS SURGERY  2014   RIB FRACTURE SURGERY  2019   TONSILLECTOMY     Family History  Problem Relation Age of Onset   Stomach cancer Mother    Cancer Mother    Hypertension Father    Heart disease Father    Stroke Father    Diabetes Brother    Allergies Brother    Asthma Brother    Allergic rhinitis Brother    Diabetes Paternal Aunt    Hypertension Brother    Stroke Brother    Allergies Sister    Asthma Sister    Diabetes Sister    Allergic rhinitis Sister    Allergies Son    Allergies Daughter    Cancer Maternal Aunt    Stroke Maternal Uncle    Cancer Paternal Aunt        breast   Breast cancer  Paternal Aunt    Cancer Maternal Uncle        pancreatic   Allergies as of 03/09/2024       Reactions   Erythromycin Anaphylaxis   Influenza Vaccines Anaphylaxis   Penicillins Anaphylaxis   Phenylephrine Anaphylaxis   Tetracycline Anaphylaxis   Tilactase Shortness Of Breath   Other reaction(s): Wheezing Other reaction(s): Wheezing Other reaction(s): Wheezing    Other reaction(s): Wheezing Other reaction(s): Wheezing   Tropicamide Swelling   Benzalkonium Chloride Rash   Lactose Intolerance (gi) Swelling   wheezing   Peach [prunus Persica] Swelling   Peanut Oil    Pt suspects   Pneumococcal Vaccine Other (See Comments)   Pt doesn't take this vaccine as a precaution due to other allergens she has. Other reaction(s): Other (See Comments) Pt doesn't take this vaccine as a precaution due to other allergens she has.   Wound Dressing Adhesive    Bee Venom Rash   Milk (cow) Nausea And Vomiting   Neomycin-bacitracin Zn-polymyx Rash        Medication List        Accurate as of March 09, 2024  2:20 PM. If you have any questions, ask your nurse or doctor.          STOP taking these medications    Debrox 6.5 % OTIC solution Generic drug: carbamide peroxide Stopped by: [name]   sulfamethoxazole -trimethoprim  800-160 MG tablet Commonly known as: Bactrim  DS Stopped by: [name]       TAKE these medications    albuterol  108 (90 Base) MCG/ACT inhaler Commonly known as: Ventolin  HFA Inhale 2 puffs into the lungs every 6 (six) hours as needed for wheezing or shortness of breath.   alendronate  70 MG tablet Commonly known as: FOSAMAX  Take 1 tablet (70 mg total) by mouth once a week.   amitriptyline  10 MG tablet Commonly known as: ELAVIL  Take 1-3 tablets (10-30 mg total) by mouth at bedtime.   amLODipine  5 MG tablet Commonly known as: NORVASC  Take 1 tablet (5 mg total) by mouth daily.   EPINEPHrine  0.3 mg/0.3 mL Soaj injection Commonly known as: EpiPen   2-Pak Inject 0.3 mg into the muscle as needed for anaphylaxis.   KP Fish Oil 1200 MG Caps Take by mouth.   levofloxacin  500 MG tablet Commonly known as: LEVAQUIN  Take 1 tablet (500 mg total) by mouth daily for 7 days. Started by: Aribella Vavra  mupirocin  ointment 2 % Commonly known as: BACTROBAN  Apply 1 Application topically 3 (three) times daily.   predniSONE  20 MG tablet Commonly known as: DELTASONE  60 mg x1d, 40 mg x3d, 20 mg x2d, 10 mg x2d Started by: Kenda Kloehn   triamcinolone  0.025 % ointment Commonly known as: KENALOG  Apply 1 Application topically 2 (two) times daily. Use for 2 weeks at a time for a flare.  Use at night two to three times a week for maintenance dosing.   VITAMIN D3 PO Take by mouth.   Vitamin K1 Powd by Does not apply route.        All past medical history, surgical history, allergies, family history, immunizations andmedications were updated in the EMR today and reviewed under the history and medication portions of their EMR.     Review of Systems  Constitutional:  Positive for chills, fever and malaise/fatigue.  HENT:  Positive for congestion and sinus pain.   Respiratory:  Positive for cough.   Musculoskeletal:  Negative for myalgias.  Skin:  Negative for rash.  Neurological:  Negative for dizziness and headaches.   Negative, with the exception of above mentioned in HPI   Objective:  BP 120/80   Pulse 81   Temp 98.5 F (36.9 C) (Oral)   Wt 149 lb 3.2 oz (67.7 kg)   LMP 07/21/2008 (Approximate)   SpO2 97%   BMI 27.11 kg/m  Body mass index is 27.11 kg/m. Physical Exam Vitals and nursing note reviewed.  Constitutional:      General: She is not in acute distress.    Appearance: Normal appearance. She is normal weight. She is not ill-appearing or toxic-appearing.  HENT:     Head: Normocephalic and atraumatic.     Right Ear: Tympanic membrane, ear canal and external ear normal. There is no impacted cerumen.     Left Ear:  Tympanic membrane, ear canal and external ear normal. There is no impacted cerumen.     Ears:     Comments: Right TM with tympanostomy tube in place .    Nose: Congestion and rhinorrhea present.     Comments: PND present    Mouth/Throat:     Mouth: Mucous membranes are moist.     Pharynx: No oropharyngeal exudate or posterior oropharyngeal erythema.  Eyes:     General: No scleral icterus.       Right eye: No discharge.        Left eye: No discharge.     Extraocular Movements: Extraocular movements intact.     Conjunctiva/sclera: Conjunctivae normal.     Pupils: Pupils are equal, round, and reactive to light.  Cardiovascular:     Rate and Rhythm: Normal rate and regular rhythm.     Heart sounds: No murmur heard. Pulmonary:     Effort: Pulmonary effort is normal. No respiratory distress.     Breath sounds: Wheezing and rhonchi present. No rales.  Musculoskeletal:     Cervical back: Neck supple.     Right lower leg: No edema.     Left lower leg: No edema.  Lymphadenopathy:     Cervical: No cervical adenopathy.  Skin:    Findings: No rash.  Neurological:     Mental Status: She is alert and oriented to person, place, and time. Mental status is at baseline.     Motor: No weakness.     Coordination: Coordination normal.     Gait: Gait normal.  Psychiatric:        Mood  and Affect: Mood normal.        Behavior: Behavior normal.        Thought Content: Thought content normal.        Judgment: Judgment normal.      No results found. No results found. Results for orders placed or performed in visit on 03/09/24 (from the past 24 hours)  POC COVID-19 BinaxNow     Status: None   Collection Time: 03/09/24  1:26 PM  Result Value Ref Range   SARS Coronavirus 2 Ag Negative Negative    Assessment/Plan: Alexandria Rush is a 73 y.o. 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  mucinex (DM if cough), nettie pot or nasal saline.  LQ and prednisone  taper prescribed, take until completed.  Wheezing present> duoneb x1> improved lung sounds and air movement IM depo medrol  80 provided today> prednisone  taper to start tomorrow. If cough present it can last up to 6-8 weeks.  F/U 2 weeks if not improved.   Reviewed expectations re: course of current medical issues. Discussed self-management of symptoms. Outlined signs and symptoms indicating need for more acute intervention. Patient verbalized understanding and all questions were answered. Patient received an After-Visit Summary.    Orders Placed This Encounter  Procedures   POC COVID-19 BinaxNow   Meds ordered this encounter  Medications   levofloxacin  (LEVAQUIN ) 500 MG tablet    Sig: Take 1 tablet (500 mg total) by mouth daily for 7 days.    Dispense:  7 tablet    Refill:  0   predniSONE  (DELTASONE ) 20 MG tablet    Sig: 60 mg x1d, 40 mg x3d, 20 mg x2d, 10 mg x2d    Dispense:  12 tablet    Refill:  0   ipratropium-albuterol  (DUONEB) 0.5-2.5 (3) MG/3ML nebulizer solution 3 mL   methylPREDNISolone  acetate (DEPO-MEDROL ) injection 80 mg   Referral Orders  No referral(s) requested today     Note is dictated utilizing voice recognition software. Although note has been proof read prior to signing, occasional typographical errors still can be missed. If any questions arise, please do not hesitate to call for verification.   electronically signed by:  [name], DO  [facility] [place]

## 2024-03-14 ENCOUNTER — Telehealth: Payer: Self-pay

## 2024-03-14 ENCOUNTER — Telehealth: Payer: Self-pay | Admitting: Family Medicine

## 2024-03-14 MED ORDER — EPINEPHRINE 0.3 MG/0.3ML IJ SOAJ: 0.3000 mg | INTRAMUSCULAR | 0 refills | Status: AC | PRN

## 2024-03-14 NOTE — Telephone Encounter (Signed)
 Communication  Reason for CRM: Patient was looking into the polio vaccine and it does have Mycin in it so she cannot have the vaccine. She wanted to let the PCP know this.   Noted.

## 2024-03-14 NOTE — Telephone Encounter (Signed)
 Copied from CRM #[id number]. Topic: Clinical - Medication Refill >> Mar 14, 2024  9:29 AM Berneda FALCON wrote: Medication: EPINEPHrine  (EPIPEN  2-PAK) 0.3 mg/0.3 mL IJ SOAJ injection  Has the patient contacted their pharmacy? Yes (Agent: If no, request that the patient contact the pharmacy for the refill. If patient does not wish to contact the pharmacy document the reason why and proceed with request.) (Agent: If yes, when and what did the pharmacy advise?)  This is the patient's preferred pharmacy:  [id number] - [place], [place] - [address] [place] [address] Phone: [phone] Fax: [phone]  Is this the correct pharmacy for this prescription? Yes If no, delete pharmacy and type the correct one.   Has the prescription been filled recently? No  Is the patient out of the medication? Yes  Has the patient been seen for an appointment in the last year OR does the patient have an upcoming appointment? Yes  Can we respond through MyChart? Yes  Agent: Please be advised that Rx refills may take up to 3 business days. We ask that you follow-up with your pharmacy.

## 2024-04-22 ENCOUNTER — Emergency Department (HOSPITAL_BASED_OUTPATIENT_CLINIC_OR_DEPARTMENT_OTHER)

## 2024-04-22 ENCOUNTER — Emergency Department (HOSPITAL_BASED_OUTPATIENT_CLINIC_OR_DEPARTMENT_OTHER)
Admission: EM | Admit: 2024-04-22 | Discharge: 2024-04-22 | Disposition: A | Discharge status: Home / Self Care | Source: Ambulatory Visit | Attending: Emergency Medicine | Admitting: Emergency Medicine

## 2024-04-22 ENCOUNTER — Ambulatory Visit: Payer: Self-pay

## 2024-04-22 ENCOUNTER — Other Ambulatory Visit: Payer: Self-pay

## 2024-04-22 DIAGNOSIS — R1032 Left lower quadrant pain: Secondary | ICD-10-CM | POA: Diagnosis not present

## 2024-04-22 DIAGNOSIS — N281 Cyst of kidney, acquired: Secondary | ICD-10-CM | POA: Diagnosis not present

## 2024-04-22 DIAGNOSIS — Z79899 Other long term (current) drug therapy: Secondary | ICD-10-CM | POA: Insufficient documentation

## 2024-04-22 DIAGNOSIS — I10 Essential (primary) hypertension: Secondary | ICD-10-CM | POA: Insufficient documentation

## 2024-04-22 DIAGNOSIS — K922 Gastrointestinal hemorrhage, unspecified: Secondary | ICD-10-CM | POA: Diagnosis not present

## 2024-04-22 DIAGNOSIS — K449 Diaphragmatic hernia without obstruction or gangrene: Secondary | ICD-10-CM | POA: Diagnosis not present

## 2024-04-22 DIAGNOSIS — R109 Unspecified abdominal pain: Secondary | ICD-10-CM | POA: Diagnosis present

## 2024-04-22 DIAGNOSIS — K7689 Other specified diseases of liver: Secondary | ICD-10-CM | POA: Diagnosis not present

## 2024-04-22 LAB — COMPREHENSIVE METABOLIC PANEL WITH GFR
ALT: 8 U/L (ref 0–44)
AST: 21 U/L (ref 15–41)
Albumin: 3.8 g/dL (ref 3.5–5.0)
Alkaline Phosphatase: 96 U/L (ref 38–126)
Anion gap: 11 (ref 5–15)
BUN: 15 mg/dL (ref 8–23)
CO2: 25 mmol/L (ref 22–32)
Calcium: 9.4 mg/dL (ref 8.9–10.3)
Chloride: 104 mmol/L (ref 98–111)
Creatinine, Ser: 0.81 mg/dL (ref 0.44–1.00)
GFR, Estimated: >60 mL/min (ref >60)
Glucose, Bld: 105 mg/dL — ABNORMAL HIGH (ref 70–99)
Potassium: 3.9 mmol/L (ref 3.5–5.1)
Sodium: 139 mmol/L (ref 135–145)
Total Bilirubin: 0.3 mg/dL (ref 0.0–1.2)
Total Protein: 6.4 g/dL — ABNORMAL LOW (ref 6.5–8.1)

## 2024-04-22 LAB — URINALYSIS, W/ REFLEX TO CULTURE (INFECTION SUSPECTED)
Bacteria, UA: NONE SEEN
Bilirubin Urine: NEGATIVE
Glucose, UA: NEGATIVE mg/dL
Hgb urine dipstick: NEGATIVE
Ketones, ur: NEGATIVE mg/dL
Nitrite: NEGATIVE
Protein, ur: NEGATIVE mg/dL
Specific Gravity, Urine: 1.023 (ref 1.005–1.030)
pH: 6.5 (ref 5.0–8.0)

## 2024-04-22 LAB — OCCULT BLOOD X 1 CARD TO LAB, STOOL: Fecal Occult Bld: POSITIVE — AB

## 2024-04-22 LAB — CBC
HCT: 37.4 % (ref 36.0–46.0)
Hemoglobin: 12.5 g/dL (ref 12.0–15.0)
MCH: 32.7 pg (ref 26.0–34.0)
MCHC: 33.4 g/dL (ref 30.0–36.0)
MCV: 97.9 fL (ref 80.0–100.0)
Platelets: 248 K/uL (ref 150–400)
RBC: 3.82 MIL/uL — ABNORMAL LOW (ref 3.87–5.11)
RDW: 14.4 % (ref 11.5–15.5)
WBC: 6.4 K/uL (ref 4.0–10.5)
nRBC: 0 % (ref 0.0–0.2)

## 2024-04-22 LAB — ABO/RH: ABO/RH(D): O POS

## 2024-04-22 LAB — LIPASE, BLOOD: Lipase: 23 U/L (ref 11–51)

## 2024-04-22 MED ORDER — PANTOPRAZOLE SODIUM 20 MG PO TBEC: 20.0000 mg | DELAYED_RELEASE_TABLET | Freq: Every day | ORAL | 1 refills | Status: AC

## 2024-04-22 MED ORDER — IOHEXOL 300 MG/ML  SOLN
85.0000 mL | Freq: Once | INTRAMUSCULAR | Status: AC | PRN
  Administered 2024-04-22: 85 mL via INTRAVENOUS

## 2024-04-22 MED ORDER — PANTOPRAZOLE SODIUM 40 MG IV SOLR
40.0000 mg | Freq: Once | INTRAVENOUS | Status: AC
  Administered 2024-04-22: 40 mg via INTRAVENOUS
  Filled 2024-04-22: qty 10

## 2024-04-22 NOTE — ED Triage Notes (Signed)
 Dark stool. Abd pain. Recent travel to [place], [place], [place]. No thinners.

## 2024-04-22 NOTE — Discharge Instructions (Signed)
 He was seen in the emerged part for concern of abdominal pain and GI bleeding The blood work did not show evidence of significant blood loss The CAT scan did not show any abnormalities that explain your stomach pain Your urine test was negative Your stool did test positive for blood As we discussed, this could be due to a bleeding stomach ulcer For this reason we gave your prescription for pantoprazole  which can help with stomach ulcers if that is what this is You need to follow-up with Dr.Magod to discuss today's visit and the need for endoscopy/colonoscopy Return to the emergency room for severe pain, worsening bleeding, if you are feeling unwell or any other concerns Take Tylenol  as directed for pain Try to stay away from Motrin ibuprofen Advil and other NSAIDs

## 2024-04-22 NOTE — ED Provider Notes (Signed)
 [place] EMERGENCY DEPARTMENT AT [facility] Provider Note   CSN: [id number] Arrival date & time: 04/22/24  1554     Patient presents with: No chief complaint on file.   Alexandria Rush is a 73 y.o. female.  With a history of GERD, peptic esophagitis and hypertension who presents to the ED for abdominal pain.  Patient reports significant abdominal pain over the last couple days.  Pain seems to come and go randomly.  Beginning today she began to have black stools concerning for bleeding.  No nausea vomiting diarrhea.  No hematemesis.  No prior history of GI bleeding.  Patient mentions that she just returned from international travel with her husband where she was traveling for the last month or [place].  She did eat lots of rich foods including many salted meats in [place] and drank more alcohol than usual.   HPI     Prior to Admission medications   Medication Sig Start Date End Date Taking? Authorizing Provider  pantoprazole  (PROTONIX ) 20 MG tablet Take 1 tablet (20 mg total) by mouth daily. 04/22/24  Yes Pamella Ozell LABOR, DO  albuterol  (VENTOLIN  HFA) 108 (90 Base) MCG/ACT inhaler Inhale 2 puffs into the lungs every 6 (six) hours as needed for wheezing or shortness of breath. 06/23/23   Kuneff, Renee A, DO  alendronate  (FOSAMAX ) 70 MG tablet Take 1 tablet (70 mg total) by mouth once a week. 06/23/23   Kuneff, Renee A, DO  amitriptyline  (ELAVIL ) 10 MG tablet Take 1-3 tablets (10-30 mg total) by mouth at bedtime. 12/08/23   Kuneff, Renee A, DO  amLODipine  (NORVASC ) 5 MG tablet Take 1 tablet (5 mg total) by mouth daily. 12/08/23   Kuneff, Renee A, DO  Cholecalciferol (VITAMIN D3 PO) Take by mouth.    [provider]  EPINEPHrine  (EPIPEN  2-PAK) 0.3 mg/0.3 mL IJ SOAJ injection Inject 0.3 mg into the muscle as needed for anaphylaxis. 03/14/24   Kuneff, Renee A, DO  mupirocin  ointment (BACTROBAN ) 2 % Apply 1 Application topically 3 (three) times daily. 07/17/23   Crain, Whitney L, PA   Omega-3 Fatty Acids (KP FISH OIL) 1200 MG CAPS Take by mouth.    [provider]  Phytonadione (VITAMIN K1) POWD by Does not apply route.    [provider]  predniSONE  (DELTASONE ) 20 MG tablet 60 mg x1d, 40 mg x3d, 20 mg x2d, 10 mg x2d 03/09/24   Kuneff, Renee A, DO  triamcinolone  (KENALOG ) 0.025 % ointment Apply 1 Application topically 2 (two) times daily. Use for 2 weeks at a time for a flare.  Use at night two to three times a week for maintenance dosing. 09/22/22   [name], MD    Allergies: Erythromycin, Influenza vaccines, Penicillins, Phenylephrine, Tetracycline, Tilactase, Tropicamide, Benzalkonium chloride, Lactose intolerance (gi), Peach [prunus persica], Peanut oil, Pneumococcal vaccine, Wound dressing adhesive, Bee venom, Milk (cow), and Neomycin-bacitracin zn-polymyx    Review of Systems  Updated Vital Signs BP (!) 152/92   Pulse 65   Temp 97.7 F (36.5 C)   Resp 16   LMP 07/21/2008 (Approximate)   SpO2 98%   Physical Exam Vitals and nursing note reviewed.  HENT:     Head: Normocephalic and atraumatic.  Eyes:     Pupils: Pupils are equal, round, and reactive to light.  Cardiovascular:     Rate and Rhythm: Normal rate and regular rhythm.  Pulmonary:     Effort: Pulmonary effort is normal.     Breath  sounds: Normal breath sounds.  Abdominal:     Palpations: Abdomen is soft.     Tenderness: There is abdominal tenderness. There is no guarding or rebound.     Comments: Epigastric tenderness  Genitourinary:    Rectum: Normal. Guaiac result positive.  Skin:    General: Skin is warm and dry.  Neurological:     Mental Status: She is alert.  Psychiatric:        Mood and Affect: Mood normal.     (all labs ordered are listed, but only abnormal results are displayed) Labs Reviewed  CBC - Abnormal; Notable for the following components:      Result Value   RBC 3.82 (*)    All other components within normal limits  COMPREHENSIVE  METABOLIC PANEL WITH GFR - Abnormal; Notable for the following components:   Glucose, Bld 105 (*)    Total Protein 6.4 (*)    All other components within normal limits  URINALYSIS, W/ REFLEX TO CULTURE (INFECTION SUSPECTED) - Abnormal; Notable for the following components:   Leukocytes,Ua TRACE (*)    All other components within normal limits  OCCULT BLOOD X 1 CARD TO LAB, STOOL - Abnormal; Notable for the following components:   Fecal Occult Bld POSITIVE (*)    All other components within normal limits  LIPASE, BLOOD  POC OCCULT BLOOD, ED  ABO/RH    EKG: None  Radiology: CT ABDOMEN PELVIS W CONTRAST Result Date: 04/22/2024 EXAM: CT ABDOMEN AND PELVIS WITH CONTRAST 04/22/2024 09:49:44 PM TECHNIQUE: CT of the abdomen and pelvis was performed with the administration of intravenous contrast. Multiplanar reformatted images are provided for review. Automated exposure control, iterative reconstruction, and/or weight-based adjustment of the mA/kV was utilized to reduce the radiation dose to as low as reasonably achievable. COMPARISON: None available. CLINICAL HISTORY: LLQ pain, dark stools. Table formatting from the original note was not included.; Dark stool. Abd pain. Recent travel to [place], [place], [place] FINDINGS: LOWER CHEST: No acute abnormality. LIVER: The liver contains scattered simple cysts. GALLBLADDER AND BILE DUCTS: Gallbladder is unremarkable. No biliary ductal dilatation. SPLEEN: No acute abnormality. PANCREAS: No acute abnormality. ADRENAL GLANDS: No acute abnormality. KIDNEYS, URETERS AND BLADDER: Tiny cortical hypodensities within the kidneys bilaterally likely represent tiny cortical cysts or too small to accurately characterize. Simple parapelvic cyst noted within the lower pole of the left kidney. No follow up imaging is recommended for these lesions. The kidneys are otherwise unremarkable. No stones in the kidneys or ureters. No hydronephrosis. No perinephric or periureteral  stranding. Urinary bladder is unremarkable. GI AND BOWEL: Stomach demonstrates a small hiatal hernia. There is no bowel obstruction. PERITONEUM AND RETROPERITONEUM: No ascites. No free air. VASCULATURE: Mild aortoiliac atherosclerotic calcification. Aorta is otherwise normal in caliber. LYMPH NODES: No lymphadenopathy. REPRODUCTIVE ORGANS: No acute abnormality. BONES AND SOFT TISSUES: Changes of moderate degenerative disc disease are seen at L2-L3. No acute osseous abnormality. No focal soft tissue abnormality. IMPRESSION: 1. No acute findings in the abdomen or pelvis. Electronically signed by: Dorethia Molt MD 04/22/2024 09:59 PM EDT RP Workstation: [id number]     Procedures   Medications Ordered in the ED  pantoprazole  (PROTONIX ) injection 40 mg (has no administration in time range)  iohexol  (OMNIPAQUE ) 300 MG/ML solution 85 mL (85 mLs Intravenous Contrast Given 04/22/24 2149)    Clinical Course as of 04/22/24 2330  Fri Apr 22, 2024  2325 UA negative.  Laboratory workup including hemoglobin unremarkable.  Renal function kidney function within normal limits.  Colonic  positive stool.  CT shows no acute intra-abdominal pathologies.  Informed patient and her husband of these results.  Most likely etiology would be bleeding peptic ulcer.  Gave her a dose of pantoprazole  here and discharged with pantoprazole .  Discussed rationale for potential admission to expedite GI workup versus discharge with close GI follow-up.  Patient wishes for discharge at this time and will follow-up with Dr.Magod and GI who she has seen before.  Return precautions have be worrisome for severe abdominal pain and increased bleeding were discussed in detail with the patient and her husband. [MP]    Clinical Course User Index [MP] Pamella Ozell LABOR, DO                                 Medical Decision Making 73 year old female with history as above presents to the ED for abdominal pain GI bleeding.  Epigastric tenderness.   Hemodynamically stable.  Afebrile.  Differential diagnosis includes peptic ulcer disease, GERD, recurrent esophagitis, diverticulosis intra-abdominal infection.  Guaiac positive stool.  No external hemorrhoids visualized.  Will send for labs including UA and obtain CT abdomen pelvis  Amount and/or Complexity of Data Reviewed Labs: ordered. Radiology: ordered.  Risk Prescription drug management.        Final diagnoses:  Upper GI bleeding    ED Discharge Orders          Ordered    pantoprazole  (PROTONIX ) 20 MG tablet  Daily        04/22/24 2324               Pamella Ozell LABOR, DO 04/22/24 2330

## 2024-04-22 NOTE — Telephone Encounter (Signed)
 FYI Only or Action Required?: FYI only for provider. Dispo ED  Patient was last seen in primary care on 03/09/2024 by Catherine Fuller A, DO.  Called Nurse Triage reporting Abdominal Pain.  Symptoms began a week ago.  Interventions attempted: OTC medications: GasX Ibuprofen and Rest, hydration, or home remedies.  Symptoms are: gradually worsening.  Triage Disposition: Go to ED Now (Notify PCP)  Patient/caregiver understands and will follow disposition?: Yes  Copied from CRM [id number]. Topic: Clinical - Red Word Triage >> Apr 22, 2024  3:05 PM Alexandria Rush wrote: [facility] that prompted transfer to Nurse Triage: Patient has been having severe GI pain for the past week and a half. She has been feeling nauseous and her stool is very black. Reason for Disposition  Black or tarry bowel movements  (Exception: Chronic-unchanged black-grey BMs AND is taking iron pills or Pepto-Bismol.)  Answer Assessment - Initial Assessment Questions Just got back from [place]. Week and a half of pain. Sharp shooting pain, lower abdomen, doesn't feel like reflux. Location varies- Sometimes left versus right. Pain present always but severity comes and goes. Stools are all black, denies fever, CP, or SOB. States she feels some pressure into her chest from the pain when she lays down.  GasX with very mild relief, 800mg  Ibuprofen with mild relief.   1. LOCATION: Where does it hurt?      Below her ribs but it moves 2. RADIATION: Does the pain shoot anywhere else? (e.g., chest, back)     Towards her back 3. ONSET: When did the pain begin? (e.g., minutes, hours or days ago)      Week and a half of pain. 4. SUDDEN: Gradual or sudden onset?     Sudden onset 5. PATTERN Does the pain come and go, or is it constant?     Comes and goes, wakes her in the middle of the night  6. SEVERITY: How bad is the pain?  (e.g., Scale 1-10; mild, moderate, or severe)     8/10 pain 7. RECURRENT SYMPTOM: Have you ever had this  type of stomach pain before? If Yes, ask: When was the last time? and What happened that time?      denies 8. CAUSE: What do you think is causing the stomach pain? (e.g., gallstones, recent abdominal surgery)     Gallstones maybe  9. RELIEVING/AGGRAVATING FACTORS: What makes it better or worse? (e.g., antacids, bending or twisting motion, bowel movement)     Worse when laying flat, standing helps 10. OTHER SYMPTOMS: Do you have any other symptoms? (e.g., back pain, diarrhea, fever, urination pain, vomiting)       Black stools  Protocols used: Abdominal Pain - Female-A-AH

## 2024-05-02 ENCOUNTER — Encounter: Admitting: Obstetrics and Gynecology

## 2024-05-09 DIAGNOSIS — R195 Other fecal abnormalities: Secondary | ICD-10-CM | POA: Diagnosis not present

## 2024-05-09 DIAGNOSIS — R109 Unspecified abdominal pain: Secondary | ICD-10-CM | POA: Diagnosis not present

## 2024-05-11 DIAGNOSIS — K449 Diaphragmatic hernia without obstruction or gangrene: Secondary | ICD-10-CM | POA: Diagnosis not present

## 2024-05-11 DIAGNOSIS — R1013 Epigastric pain: Secondary | ICD-10-CM | POA: Diagnosis not present

## 2024-05-11 DIAGNOSIS — K922 Gastrointestinal hemorrhage, unspecified: Secondary | ICD-10-CM | POA: Diagnosis not present

## 2024-05-11 DIAGNOSIS — K2930 Chronic superficial gastritis without bleeding: Secondary | ICD-10-CM | POA: Diagnosis not present

## 2024-05-11 DIAGNOSIS — K259 Gastric ulcer, unspecified as acute or chronic, without hemorrhage or perforation: Secondary | ICD-10-CM | POA: Diagnosis not present

## 2024-05-18 NOTE — Progress Notes (Unsigned)
 73 y.o. G2P2 Married Caucasian female here for a breast and pelvic exam.    The patient is also followed for chronic vulvitis and vulvar epidermoid cysts. Using triamcinolone  periodically.    Treated for infected sebaceous cyst of the vulva in June, 2025.  Hx HSV I oral.   Denies vaginal bleeding.    Dx with a bleeding ulcer.  Some lower abdominal discomfort.   Traveled to [place].    PCP: Catherine Fuller A, DO   Patient's last menstrual period was 07/21/2008 (approximate).           Sexually active: Yes.    The current method of family planning is post menopausal status.    Menopausal hormone therapy:  n/a Exercising: Yes.    Waking daily and silver sneakers workout class 45 min 1x week  Smoker:  no  OB History     Gravida  2   Para  2   Term      Preterm      AB      Living  2      SAB      IAB      Ectopic      Multiple      Live Births              HEALTH MAINTENANCE: Last 2 paps: 08/28/21 neg HR HPV neg, 05/31/19 neg  History of abnormal Pap or positive HPV:  no Mammogram:  09/09/23 Breast Density Cat B, BIRADS Cat 4 Sus.  Benign needle aspiration.   Colonoscopy:  01/07/23 Bone Density:  08/26/23  Result  osteoporotic    Immunization History  Administered Date(s) Administered   PFIZER(Purple Top)SARS-COV-2 Vaccination 09/16/2019, 10/12/2019, 04/23/2020   Tdap 08/21/2013      reports that she has never smoked. She has never used smokeless tobacco. She reports that she does not currently use alcohol. She reports that she does not use drugs.  Past Medical History:  Diagnosis Date   Allergy  1971   Anemia 1965   Asthma    Chicken pox    Elevated alkaline phosphatase level 06/27/2019   Elevated MCV 06/08/2023   GERD (gastroesophageal reflux disease)    HSV-1 infection    Hypertension 2020   Left patella fracture 2010   MVP (mitral valve prolapse)    Nasal septal deviation 12/09/2012   Other microscopic hematuria 09/22/2018   Rib  fracture 08/27/2017   left 6-9 rib fx- seen of f/u CT- xray was normal   Stomach ulcer    Tibial fracture 2018   left tibial plateau fracture    Past Surgical History:  Procedure Laterality Date   ADENOIDECTOMY     APPENDECTOMY  1972   BREAST BIOPSY Left 09/14/2023   US  LT BREAST BX W LOC DEV 1ST LESION IMG BX SPEC US  GUIDE 09/14/2023 [facility] MAMMOGR[facility]   COLONOSCOPY  2019   HYSTEROSCOPY     NASAL SINUS SURGERY  2014   RIB FRACTURE SURGERY  2019   TONSILLECTOMY      Current Outpatient Medications  Medication Sig Dispense Refill   albuterol  (VENTOLIN  HFA) 108 (90 Base) MCG/ACT inhaler Inhale 2 puffs into the lungs every 6 (six) hours as needed for wheezing or shortness of breath. 8 g 11   alendronate  (FOSAMAX ) 70 MG tablet Take 1 tablet (70 mg total) by mouth once a week. 4 tablet 11   amitriptyline  (ELAVIL ) 10 MG tablet Take 1-3 tablets (10-30 mg total) by mouth at bedtime. 90 tablet  5   amLODipine  (NORVASC ) 5 MG tablet Take 1 tablet (5 mg total) by mouth daily. 90 tablet 1   Cholecalciferol (VITAMIN D3 PO) Take by mouth.     EPINEPHrine  (EPIPEN  2-PAK) 0.3 mg/0.3 mL IJ SOAJ injection Inject 0.3 mg into the muscle as needed for anaphylaxis. 1 each 0   mupirocin  ointment (BACTROBAN ) 2 % Apply 1 Application topically 3 (three) times daily. 22 g 0   Omega-3 Fatty Acids (KP FISH OIL) 1200 MG CAPS Take by mouth.     pantoprazole  (PROTONIX ) 20 MG tablet Take 1 tablet (20 mg total) by mouth daily. 30 tablet 1   Phytonadione (VITAMIN K1) POWD by Does not apply route.     predniSONE  (DELTASONE ) 20 MG tablet 60 mg x1d, 40 mg x3d, 20 mg x2d, 10 mg x2d (Patient not taking: Reported on 05/19/2024) 12 tablet 0   triamcinolone  (KENALOG ) 0.025 % ointment Apply 1 Application topically 2 (two) times daily. Use for 2 weeks at a time for a flare.  Use at night two to three times a week for maintenance dosing. 30 g 1   No current facility-administered medications for this visit.    ALLERGIES:  Erythromycin, Influenza vaccines, Penicillins, Phenylephrine, Tetracycline, Tilactase, Tropicamide, Benzalkonium chloride, Lactose intolerance (gi), Peach [prunus persica], Peanut oil, Pneumococcal vaccine, Wound dressing adhesive, Bee venom, Milk (cow), and Neomycin-bacitracin zn-polymyx  Family History  Problem Relation Age of Onset   Stomach cancer Mother    Cancer Mother    Hypertension Father    Heart disease Father    Stroke Father    Diabetes Brother    Allergies Brother    Asthma Brother    Allergic rhinitis Brother    Diabetes Paternal Aunt    Hypertension Brother    Stroke Brother    Allergies Sister    Asthma Sister    Diabetes Sister    Allergic rhinitis Sister    Allergies Son    Allergies Daughter    Cancer Maternal Aunt    Stroke Maternal Uncle    Cancer Paternal Aunt        breast   Breast cancer Paternal Aunt    Cancer Maternal Uncle        pancreatic    Review of Systems  All other systems reviewed and are negative.   PHYSICAL EXAM:  BP 116/78 (BP Location: Left Arm, Patient Position: Sitting)   Pulse 71   Ht 5' 3.5 (1.613 m)   Wt 149 lb (67.6 kg)   LMP 07/21/2008 (Approximate)   SpO2 99%   BMI 25.98 kg/m     General appearance: alert, cooperative and appears stated age Head: normocephalic, without obvious abnormality, atraumatic Neck: no adenopathy, supple, symmetrical, trachea midline and thyroid  normal to inspection and palpation Lungs: clear to auscultation bilaterally Breasts: normal appearance, no masses or tenderness, No nipple retraction or dimpling, No nipple discharge or bleeding, No axillary adenopathy Heart: regular rate and rhythm Abdomen: soft, non-tender; no masses, no organomegaly Extremities: extremities normal, atraumatic, no cyanosis or edema Skin: skin color, texture, turgor normal. No rashes or lesions Lymph nodes: cervical, supraclavicular, and axillary nodes normal. Neurologic: grossly normal  Pelvic: External  genitalia:  scattered sebaceous cysts.               No abnormal inguinal nodes palpated.              Urethra:  normal appearing urethra with no masses, tenderness or lesions  Bartholins and Skenes: normal                 Vagina: normal appearing vagina with normal color and discharge, no lesions              Cervix: no lesions              Pap taken: no Bimanual Exam:  Uterus:  normal size, contour, position, consistency, mobility, non-tender              Adnexa: no mass, fullness, tenderness              Rectal exam: yes.  Confirms.              Anus:  normal sphincter tone, no lesions  Chaperone was present for exam:  Kari HERO, CMA  ASSESSMENT: Encounter for breast and pelvic exam.  Personal hx of other medical treatment.  Hx HSV 1.  Chronic vulvitis. Vulvar epidermoid cysts.  Medication monitoring.  Osteoporosis.  Worsening on Fosamax .  PCP following. Hx multiple fractures, not previously documented.  Hx bleeding ulcer.  On Protonix .   PLAN: Mammogram screening discussed. Self breast awareness reviewed. Pap and HRV collected:  no.  Guidelines for Calcium, Vitamin D , regular exercise program including cardiovascular and weight bearing exercise. Medication refills:  Triamcinolone . Labs with PCP.  We reviewed her BMD and loss of bone density while on Fosamax .  She will follow up with her PCP to discuss her bone density and multiple prior fractures.   She may be a candidate for Reclast, Prolia, or Evenity.  Written information given.  Follow up:  yearly and prn.     Additional counseling given.  yes. 30 min  total time was spent for this patient encounter, including preparation, face-to-face counseling with the patient, coordination of care, and documentation of the encounter in addition to doing the breast and pelvic exam.

## 2024-05-19 ENCOUNTER — Encounter: Payer: Self-pay | Admitting: Obstetrics and Gynecology

## 2024-05-19 ENCOUNTER — Ambulatory Visit (INDEPENDENT_AMBULATORY_CARE_PROVIDER_SITE_OTHER): Admitting: Obstetrics and Gynecology

## 2024-05-19 VITALS — BP 116/78 | HR 71 | Ht 63.5 in | Wt 149.0 lb

## 2024-05-19 DIAGNOSIS — N763 Subacute and chronic vulvitis: Secondary | ICD-10-CM | POA: Diagnosis not present

## 2024-05-19 DIAGNOSIS — L723 Sebaceous cyst: Secondary | ICD-10-CM

## 2024-05-19 DIAGNOSIS — M810 Age-related osteoporosis without current pathological fracture: Secondary | ICD-10-CM

## 2024-05-19 DIAGNOSIS — Z9289 Personal history of other medical treatment: Secondary | ICD-10-CM

## 2024-05-19 DIAGNOSIS — Z5181 Encounter for therapeutic drug level monitoring: Secondary | ICD-10-CM | POA: Diagnosis not present

## 2024-05-19 DIAGNOSIS — B009 Herpesviral infection, unspecified: Secondary | ICD-10-CM | POA: Diagnosis not present

## 2024-05-19 DIAGNOSIS — Z01419 Encounter for gynecological examination (general) (routine) without abnormal findings: Secondary | ICD-10-CM | POA: Diagnosis not present

## 2024-05-19 MED ORDER — TRIAMCINOLONE ACETONIDE 0.025 % EX OINT: 1.0000 | TOPICAL_OINTMENT | Freq: Two times a day (BID) | CUTANEOUS | 1 refills | Status: AC

## 2024-05-19 NOTE — Patient Instructions (Addendum)
 Romosozumab Injection What is this medication? ROMOSOZUMAB (roe moe SOZ ue mab) prevents and treats osteoporosis. It works by interior and spatial designer stronger and less likely to break (fracture). It is a monoclonal antibody. This medicine may be used for other purposes; ask your health care provider or pharmacist if you have questions. COMMON BRAND NAME(S): EVENITY What should I tell my care team before I take this medication? They need to know if you have any of these conditions: Dental disease Heart attack Heart disease Kidney problems Low levels of calcium in the blood On dialysis Stroke Wear dentures An unusual or allergic reaction to romosozumab, other medications, foods, dyes or preservatives Pregnant or trying to get pregnant Breast-feeding How should I use this medication? This medication is injected under the skin. It is given by your care team in a hospital or clinic setting. A special MedGuide will be given to you by the pharmacist with each prescription and refill. Be sure to read this information carefully each time. Talk to your care team about the use of this medication in children. Special care may be needed. Overdosage: If you think you have taken too much of this medicine contact a poison control center or emergency room at once. NOTE: This medicine is only for you. Do not share this medicine with others. What if I miss a dose? Keep appointments for follow-up doses. It is important not to miss your dose. Call your care team if you are unable to keep an appointment. What may interact with this medication? Interactions are not expected. This list may not describe all possible interactions. Give your health care provider a list of all the medicines, herbs, non-prescription drugs, or dietary supplements you use. Also tell them if you smoke, drink alcohol, or use illegal drugs. Some items may interact with your medicine. What should I watch for while using this medication? Your  condition will be monitored carefully while you are receiving this medication. You may need bloodwork while taking this medication. You should make sure you get enough calcium and vitamin D  while you are taking this medication. Discuss the foods you eat and the vitamins you take with your care team. Some people who take this medication have severe bone, joint, or muscle pain. This medication may also increase your risk for jaw problems or a broken thigh bone. Tell your care team right away if you have severe pain in your jaw, bones, joints, or muscles. Tell you care team if you have any pain that does not go away or that gets worse. Tell your dentist and dental surgeon that you are taking this medication. You should not have major dental surgery while on this medication. See your dentist to have a dental exam and fix any dental problems before starting this medication. Take good care of your teeth while on this medication. Make sure you see your dentist for regular follow-up appointments. What side effects may I notice from receiving this medication? Side effects that you should report to your care team as soon as possible: Allergic reactions or angioedema--skin rash, itching or hives, swelling of the face, eyes, lips, tongue, arms, or legs, trouble swallowing or breathing Heart attack--pain or tightness in the chest, shoulders, arms, or jaw, nausea, shortness of breath, cold or clammy skin, feeling faint or lightheaded Low calcium level--muscle pain or cramps, confusion, tingling, or numbness in the hands or feet Osteonecrosis of the jaw--pain, swelling, or redness in the mouth, numbness of the jaw, poor healing after dental work,  unusual discharge from the mouth, visible bones in the mouth Severe bone, joint, or muscle pain Stroke--sudden numbness or weakness of the face, arm, or leg, trouble speaking, confusion, trouble walking, loss of balance or coordination, dizziness, severe headache, change in  vision Side effects that usually do not require medical attention (report to your care team if they continue or are bothersome): Headache Joint pain Muscle spasms Pain, redness, or irritation at injection site Swelling of the ankles, hands, or feet This list may not describe all possible side effects. Call your doctor for medical advice about side effects. You may report side effects to FDA at 1-800-FDA-1088. Where should I keep my medication? This medication is given in a hospital or clinic. It will not be stored at home. NOTE: This sheet is a summary. It may not cover all possible information. If you have questions about this medicine, talk to your doctor, pharmacist, or health care provider.  2024 Elsevier/Gold Standard (2021-08-07 00:00:00)  Denosumab Injection (Osteoporosis) What is this medication? DENOSUMAB (den oh SUE mab) prevents and treats osteoporosis. It works by interior and spatial designer stronger and less likely to break (fracture). It is a monoclonal antibody. This medicine may be used for other purposes; ask your health care provider or pharmacist if you have questions. COMMON BRAND NAME(S): Prolia What should I tell my care team before I take this medication? They need to know if you have any of these conditions: Dental or gum disease Had thyroid  or parathyroid (glands located in neck) surgery Having dental surgery or a tooth pulled Kidney disease Low levels of calcium in the blood On dialysis Poor nutrition Thyroid  disease Trouble absorbing nutrients from your food An unusual or allergic reaction to denosumab, other medications, foods, dyes, or preservatives Pregnant or trying to get pregnant Breastfeeding How should I use this medication? This medication is injected under the skin. It is given by your care team in a hospital or clinic setting. A special MedGuide will be given to you before each treatment. Be sure to read this information carefully each time. Talk to  your care team about the use of this medication in children. Special care may be needed. Overdosage: If you think you have taken too much of this medicine contact a poison control center or emergency room at once. NOTE: This medicine is only for you. Do not share this medicine with others. What if I miss a dose? Keep appointments for follow-up doses. It is important not to miss your dose. Call your care team if you are unable to keep an appointment. What may interact with this medication? Do not take this medication with any of the following: Other medications that contain denosumab This medication may also interact with the following: Medications that lower your chance of fighting infection Steroid medications, such as prednisone  or cortisone This list may not describe all possible interactions. Give your health care provider a list of all the medicines, herbs, non-prescription drugs, or dietary supplements you use. Also tell them if you smoke, drink alcohol, or use illegal drugs. Some items may interact with your medicine. What should I watch for while using this medication? Your condition will be monitored carefully while you are receiving this medication. You may need blood work done while taking this medication. This medication may increase your risk of getting an infection. Call your care team for advice if you get a fever, chills, sore throat, or other symptoms of a cold or flu. Do not treat yourself. Try to avoid  being around people who are sick. Tell your dentist and dental surgeon that you are taking this medication. You should not have major dental surgery while on this medication. See your dentist to have a dental exam and fix any dental problems before starting this medication. Take good care of your teeth while on this medication. Make sure you see your dentist for regular follow-up appointments. This medication may cause low levels of calcium in your body. The risk of severe side  effects is increased in people with kidney disease. Your care team may prescribe calcium and vitamin D  to help prevent low calcium levels while you take this medication. It is important to take calcium and vitamin D  as directed by your care team. Talk to your care team if you may be pregnant. Serious birth defects may occur if you take this medication during pregnancy and for 5 months after the last dose. You will need a negative pregnancy test before starting this medication. Contraception is recommended while taking this medication and for 5 months after the last dose. Your care team can help you find the option that works for you. Talk to your care team before breastfeeding. Changes to your treatment plan may be needed. What side effects may I notice from receiving this medication? Side effects that you should report to your care team as soon as possible: Allergic reactions--skin rash, itching, hives, swelling of the face, lips, tongue, or throat Infection--fever, chills, cough, sore throat, wounds that don't heal, pain or trouble when passing urine, general feeling of discomfort or being unwell Low calcium level--muscle pain or cramps, confusion, tingling, or numbness in the hands or feet Osteonecrosis of the jaw--pain, swelling, or redness in the mouth, numbness of the jaw, poor healing after dental work, unusual discharge from the mouth, visible bones in the mouth Severe bone, joint, or muscle pain Skin infection--skin redness, swelling, warmth, or pain Side effects that usually do not require medical attention (report these to your care team if they continue or are bothersome): Back pain Headache Joint pain Muscle pain Pain in the hands, arms, legs, or feet Runny or stuffy nose Sore throat This list may not describe all possible side effects. Call your doctor for medical advice about side effects. You may report side effects to FDA at 1-800-FDA-1088. Where should I keep my  medication? This medication is given in a hospital or clinic. It will not be stored at home. NOTE: This sheet is a summary. It may not cover all possible information. If you have questions about this medicine, talk to your doctor, pharmacist, or health care provider.  2024 Elsevier/Gold Standard (2022-08-12 00:00:00)  Zoledronic Acid Injection (Bone Disorders) What is this medication? ZOLEDRONIC ACID (ZOE le dron ik AS id) prevents and treats osteoporosis. It may also be used to treat Paget's disease of the bone. It works by interior and spatial designer stronger and less likely to break (fracture). It belongs to a group of medications called bisphosphonates. This medicine may be used for other purposes; ask your health care provider or pharmacist if you have questions. COMMON BRAND NAME(S): Reclast What should I tell my care team before I take this medication? They need to know if you have any of these conditions: Bleeding disorder Cancer Dental disease Kidney disease Low levels of calcium in the blood Low red blood cell counts Lung or breathing disease, such as asthma Receiving steroids, such as dexamethasone  or prednisone  An unusual or allergic reaction to zoledronic acid, other medications, foods, dyes,  or preservatives Pregnant or trying to get pregnant Breast-feeding How should I use this medication? This medication is injected into a vein. It is given by your care team in a hospital or clinic setting. A special MedGuide will be given to you before each treatment. Be sure to read this information carefully each time. Talk to your care team about the use of this medication in children. Special care may be needed. Overdosage: If you think you have taken too much of this medicine contact a poison control center or emergency room at once. NOTE: This medicine is only for you. Do not share this medicine with others. What if I miss a dose? Keep appointments for follow-up doses. It is important not  to miss your dose. Call your care team if you are unable to keep an appointment. What may interact with this medication? Certain antibiotics given by injection Medications for pain and inflammation, such as ibuprofen, naproxen, NSAIDs Some diuretics, such as bumetanide, furosemide Teriparatide This list may not describe all possible interactions. Give your health care provider a list of all the medicines, herbs, non-prescription drugs, or dietary supplements you use. Also tell them if you smoke, drink alcohol, or use illegal drugs. Some items may interact with your medicine. What should I watch for while using this medication? Visit your care team for regular checks on your progress. It may be some time before you see the benefit from this medication. Some people who take this medication have severe bone, joint, or muscle pain. This medication may also increase your risk for jaw problems or a broken thigh bone. Tell your care team right away if you have severe pain in your jaw, bones, joints, or muscles. Tell your care team if you have any pain that does not go away or that gets worse. You should make sure you get enough calcium and vitamin D  while you are taking this medication. Discuss the foods you eat and the vitamins you take with your care team. You may need bloodwork while taking this medication. Tell your dentist and dental surgeon that you are taking this medication. You should not have major dental surgery while on this medication. See your dentist to have a dental exam and fix any dental problems before starting this medication. Take good care of your teeth while on this medication. Make sure you see your dentist for regular follow-up appointments. What side effects may I notice from receiving this medication? Side effects that you should report to your care team as soon as possible: Allergic reactions--skin rash, itching, hives, swelling of the face, lips, tongue, or throat Kidney  injury--decrease in the amount of urine, swelling of the ankles, hands, or feet Low calcium level--muscle pain or cramps, confusion, tingling, or numbness in the hands or feet Osteonecrosis of the jaw--pain, swelling, or redness in the mouth, numbness of the jaw, poor healing after dental work, unusual discharge from the mouth, visible bones in the mouth Severe bone, joint, or muscle pain Side effects that usually do not require medical attention (report to your care team if they continue or are bothersome): Diarrhea Dizziness Headache Nausea Stomach pain Vomiting This list may not describe all possible side effects. Call your doctor for medical advice about side effects. You may report side effects to FDA at 1-800-FDA-1088. Where should I keep my medication? This medication is given in a hospital or clinic. It will not be stored at home. NOTE: This sheet is a summary. It may not cover all possible  information. If you have questions about this medicine, talk to your doctor, pharmacist, or health care provider.  2024 Elsevier/Gold Standard (2021-08-23 00:00:00)  EXERCISE AND DIET:  We recommended that you start or continue a regular exercise program for good health. Regular exercise means any activity that makes your heart beat faster and makes you sweat.  We recommend exercising at least 30 minutes per day at least 3 days a week, preferably 4 or 5.  We also recommend a diet low in fat and sugar.  Inactivity, poor dietary choices and obesity can cause diabetes, heart attack, stroke, and kidney damage, among others.    ALCOHOL AND SMOKING:  Women should limit their alcohol intake to no more than 7 drinks/beers/glasses of wine (combined, not each!) per week. Moderation of alcohol intake to this level decreases your risk of breast cancer and liver damage. And of course, no recreational drugs are part of a healthy lifestyle.  And absolutely no smoking or even second hand smoke. Most people know  smoking can cause heart and lung diseases, but did you know it also contributes to weakening of your bones? Aging of your skin?  Yellowing of your teeth and nails?  CALCIUM AND VITAMIN D :  Adequate intake of calcium and Vitamin D  are recommended.  The recommendations for exact amounts of these supplements seem to change often, but generally speaking 600 mg of calcium (either carbonate or citrate) and 800 units of Vitamin D  per day seems prudent. Certain women may benefit from higher intake of Vitamin D .  If you are among these women, your doctor will have told you during your visit.    PAP SMEARS:  Pap smears, to check for cervical cancer or precancers,  have traditionally been done yearly, although recent scientific advances have shown that most women can have pap smears less often.  However, every woman still should have a physical exam from her gynecologist every year. It will include a breast check, inspection of the vulva and vagina to check for abnormal growths or skin changes, a visual exam of the cervix, and then an exam to evaluate the size and shape of the uterus and ovaries.  And after 72 years of age, a rectal exam is indicated to check for rectal cancers. We will also provide age appropriate advice regarding health maintenance, like when you should have certain vaccines, screening for sexually transmitted diseases, bone density testing, colonoscopy, mammograms, etc.   MAMMOGRAMS:  All women over 40 years old should have a yearly mammogram. Many facilities now offer a 3D mammogram, which may cost around $50 extra out of pocket. If possible,  we recommend you accept the option to have the 3D mammogram performed.  It both reduces the number of women who will be called back for extra views which then turn out to be normal, and it is better than the routine mammogram at detecting truly abnormal areas.    COLONOSCOPY:  Colonoscopy to screen for colon cancer is recommended for all women at age 56.   We know, you hate the idea of the prep.  We agree, BUT, having colon cancer and not knowing it is worse!!  Colon cancer so often starts as a polyp that can be seen and removed at colonscopy, which can quite literally save your life!  And if your first colonoscopy is normal and you have no family history of colon cancer, most women don't have to have it again for 10 years.  Once every ten years, you can  do something that may end up saving your life, right?  We will be happy to help you get it scheduled when you are ready.  Be sure to check your insurance coverage so you understand how much it will cost.  It may be covered as a preventative service at no cost, but you should check your particular policy.

## 2024-06-09 DIAGNOSIS — R109 Unspecified abdominal pain: Secondary | ICD-10-CM | POA: Diagnosis not present

## 2024-07-08 ENCOUNTER — Other Ambulatory Visit: Payer: Self-pay | Admitting: Family Medicine

## 2024-07-11 NOTE — Telephone Encounter (Signed)
 Pt has appt tomorrow, 12/23

## 2024-07-12 ENCOUNTER — Encounter: Admitting: Family Medicine

## 2024-07-12 ENCOUNTER — Encounter: Payer: Self-pay | Admitting: Family Medicine

## 2024-07-12 VITALS — BP 122/76 | HR 72 | Temp 98.2°F | Ht 63.0 in | Wt 150.4 lb

## 2024-07-12 DIAGNOSIS — J4521 Mild intermittent asthma with (acute) exacerbation: Secondary | ICD-10-CM | POA: Diagnosis not present

## 2024-07-12 DIAGNOSIS — R058 Other specified cough: Secondary | ICD-10-CM | POA: Diagnosis not present

## 2024-07-12 DIAGNOSIS — Z0000 Encounter for general adult medical examination without abnormal findings: Secondary | ICD-10-CM

## 2024-07-12 DIAGNOSIS — Z1231 Encounter for screening mammogram for malignant neoplasm of breast: Secondary | ICD-10-CM | POA: Diagnosis not present

## 2024-07-12 DIAGNOSIS — Z131 Encounter for screening for diabetes mellitus: Secondary | ICD-10-CM | POA: Diagnosis not present

## 2024-07-12 DIAGNOSIS — M810 Age-related osteoporosis without current pathological fracture: Secondary | ICD-10-CM | POA: Diagnosis not present

## 2024-07-12 DIAGNOSIS — E663 Overweight: Secondary | ICD-10-CM | POA: Diagnosis not present

## 2024-07-12 DIAGNOSIS — I10 Essential (primary) hypertension: Secondary | ICD-10-CM | POA: Diagnosis not present

## 2024-07-12 LAB — COMPREHENSIVE METABOLIC PANEL WITH GFR
ALT: 14 U/L (ref 3–35)
AST: 16 U/L (ref 5–37)
Albumin: 4.4 g/dL (ref 3.5–5.2)
Alkaline Phosphatase: 82 U/L (ref 39–117)
BUN: 20 mg/dL (ref 6–23)
CO2: 25 meq/L (ref 19–32)
Calcium: 8.9 mg/dL (ref 8.4–10.5)
Chloride: 106 meq/L (ref 96–112)
Creatinine, Ser: 0.86 mg/dL (ref 0.40–1.20)
GFR: 67.55 mL/min
Glucose, Bld: 89 mg/dL (ref 70–99)
Potassium: 4 meq/L (ref 3.5–5.1)
Sodium: 141 meq/L (ref 135–145)
Total Bilirubin: 0.6 mg/dL (ref 0.2–1.2)
Total Protein: 6.7 g/dL (ref 6.0–8.3)

## 2024-07-12 LAB — LIPID PANEL
Cholesterol: 220 mg/dL — ABNORMAL HIGH (ref 28–200)
HDL: 57.9 mg/dL
LDL Cholesterol: 143 mg/dL — ABNORMAL HIGH (ref 10–99)
NonHDL: 161.89
Total CHOL/HDL Ratio: 4
Triglycerides: 92 mg/dL (ref 10.0–149.0)
VLDL: 18.4 mg/dL (ref 0.0–40.0)

## 2024-07-12 LAB — CBC
HCT: 38.2 % (ref 36.0–46.0)
Hemoglobin: 12.8 g/dL (ref 12.0–15.0)
MCHC: 33.6 g/dL (ref 30.0–36.0)
MCV: 94.6 fl (ref 78.0–100.0)
Platelets: 269 K/uL (ref 150.0–400.0)
RBC: 4.04 Mil/uL (ref 3.87–5.11)
RDW: 13.6 % (ref 11.5–15.5)
WBC: 5.6 K/uL (ref 4.0–10.5)

## 2024-07-12 LAB — HEMOGLOBIN A1C: Hgb A1c MFr Bld: 5.9 % (ref 4.6–6.5)

## 2024-07-12 LAB — VITAMIN D 25 HYDROXY (VIT D DEFICIENCY, FRACTURES): VITD: 70.14 ng/mL (ref 30.00–100.00)

## 2024-07-12 LAB — TSH: TSH: 2.08 u[IU]/mL (ref 0.35–5.50)

## 2024-07-12 MED ORDER — ALENDRONATE SODIUM 70 MG PO TABS: 70.0000 mg | ORAL_TABLET | ORAL | 11 refills | Status: AC

## 2024-07-12 MED ORDER — ALBUTEROL SULFATE HFA 108 (90 BASE) MCG/ACT IN AERS: 2.0000 | INHALATION_SPRAY | Freq: Four times a day (QID) | RESPIRATORY_TRACT | 11 refills | Status: AC | PRN

## 2024-07-12 MED ORDER — AMLODIPINE BESYLATE 5 MG PO TABS: 5.0000 mg | ORAL_TABLET | Freq: Every day | ORAL | 1 refills | Status: AC

## 2024-07-12 MED ORDER — TETANUS-DIPHTH-ACELL PERTUSSIS 5-2-15.5 LF-MCG/0.5 IM SUSP: 0.5000 mL | Freq: Once | INTRAMUSCULAR | 0 refills | Status: AC

## 2024-07-12 NOTE — Patient Instructions (Addendum)
 Return in about 24 weeks (around 12/27/2024) for Routine chronic condition follow-up.        Great to see you today.  I have refilled the medication(s) we provide.   If labs were collected or images ordered, we will inform you of  results once we have received them and reviewed. We will contact you either by echart message, or telephone call.  Please give ample time to the testing facility, and our office to run,  receive and review results. Please do not call inquiring of results, even if you can see them in your chart. We will contact you as soon as we are able. If it has been over 1 week since the test was completed, and you have not yet heard from us , then please call us .    - echart message- for normal results that have been seen by the patient already.   - telephone call: abnormal results or if patient has not viewed results in their echart.  If a referral to a specialist was entered for you, please call us  in 2 weeks if you have not heard from the specialist office to schedule.

## 2024-07-12 NOTE — Progress Notes (Signed)
 "     Patient ID: Alexandria Rush, female  DOB: 1950/09/23, 73 y.o.   MRN: [id number] Patient Care Team    Relationship Specialty Notifications Start End  [name] LABOR, DO PCP - General Family Medicine  08/20/18   [name] NOVAK, MD Consulting Physician Pulmonary Disease  08/20/18   [name], MD    08/20/18    Comment: thoracic surgeon  [name], MD Consulting Physician Gastroenterology  08/20/18   [name], MD Consulting Physician Obstetrics and Gynecology  09/22/22     Chief Complaint  Patient presents with   Annual Exam    chronic Conditions/illness Management.  Pt is fasting.     Subjective: Alexandria Rush is a 73 y.o.  Female  present for CPE and chronic Conditions/illness Management All past medical history, surgical history, allergies, family history, immunizations, medications and social history were updated in the electronic medical record today. All recent labs, ED visits and hospitalizations within the last year were reviewed.  Health maintenance:  Colonoscopy: completed 01/07/2023, by [facility], 5-year follow-up Mammogram: completed: 08/26/2023, >> [facility] Immunizations: tdap due-printed for her today, Influenza declined (encouraged yearly), PNA series declined, shingrix allergy - declined. Infectious disease screening: Hep C completed.  DEXA: 08/26/2023, osteoporosis MCHP> 2 yr follow up. 1200 mg calcium daily Assistive device: none Oxygen ldz:[id number] Patient has a Dental home. Hospitalizations/ED visits: Reviewed  Hypertension:  Pt reports compliance with amlodipine  5 mg daily.  Patient denies chest pain, shortness of breath, dizziness or lower extremity edema.   Pt does not take a daily baby ASA. Pt is is not prescribed statin. Diet: Low-sodium Exercise: Routine exercise.    Studies: Thinning of the right tegmen tympani  MRI brain:  1.  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 There is also  mild atrophy evident of this nerve.  2.  No cephalocele.  3.  No acute or subacute intracranial abnormalities. Mild chronic small vessel disease.   MRI cervical and thoracic spine:  1.  Multilevel degenerative changes of cervical spine with mild spinal canal narrowing C4-C5, C5-C6, C6-C7. No high-grade neural foraminal stenosis.  2.  Minimal prominence of the central ependymal canal of the spinal cord at C6-C7 and T7-T12, of doubtful clinical significance.      07/12/2024    8:26 AM 12/08/2023    1:26 PM 02/02/2023    9:36 AM 06/17/2022    9:33 AM 10/24/2021   10:00 AM  Depression screen PHQ 2/9  Decreased Interest 0 0 0 0 0  Down, Depressed, Hopeless 0 0 0 0 0  PHQ - 2 Score 0 0 0 0 0  Altered sleeping 0      Tired, decreased energy 0      Change in appetite 0      Feeling bad or failure about yourself  0      Trouble concentrating 0      Moving slowly or fidgety/restless 0      Suicidal thoughts 0      PHQ-9 Score 0      Difficult doing work/chores Not difficult at all           02/02/2023    9:36 AM 06/04/2023   10:46 AM 12/08/2023    1:26 PM 05/19/2024    9:31 AM 07/12/2024    8:26 AM  Fall Risk  Falls in the past year? 0 0  0 0 0  Was there an injury  with Fall? 0   0  0  0  Fall Risk Category Calculator 0  0 0 0  Patient at Risk for Falls Due to   No Fall Risks No Fall Risks No Fall Risks  Fall risk Follow up Falls evaluation completed  Falls evaluation completed Falls evaluation completed Falls evaluation completed     Manually entered by patient   Data saved with a previous flowsheet row definition     Immunization History  Administered Date(s) Administered   PFIZER(Purple Top)SARS-COV-2 Vaccination 09/16/2019, 10/12/2019, 04/23/2020   Tdap 08/21/2013   Past Medical History:  Diagnosis Date   Allergy  1971   Anemia 1965   Asthma    Chicken pox    Elevated alkaline phosphatase level 06/27/2019   Elevated MCV 06/08/2023   GERD (gastroesophageal reflux  disease)    HSV-1 infection    Hypertension 2020   Left patella fracture 2010   MVP (mitral valve prolapse)    Nasal septal deviation 12/09/2012   Other microscopic hematuria 09/22/2018   Rib fracture 08/27/2017   left 6-9 rib fx- seen of f/u CT- xray was normal   Stomach ulcer    Tibial fracture 2018   left tibial plateau fracture   Allergies  Allergen Reactions   Erythromycin Anaphylaxis   Influenza Vaccines Anaphylaxis   Penicillins Anaphylaxis   Phenylephrine Anaphylaxis   Tetracycline Anaphylaxis   Tilactase Shortness Of Breath    Other reaction(s): Wheezing  Other reaction(s): Wheezing Other reaction(s): Wheezing    Other reaction(s): Wheezing Other reaction(s): Wheezing   Tropicamide Swelling   Benzalkonium Chloride Rash   Lactose Intolerance (Gi) Swelling    wheezing   Peach [Prunus Persica] Swelling   Peanut Oil     Pt suspects   Pneumococcal Vaccine Other (See Comments)    Pt doesn't take this vaccine as a precaution due to other allergens she has. Other reaction(s): Other (See Comments) Pt doesn't take this vaccine as a precaution due to other allergens she has.    Wound Dressing Adhesive    Bee Venom Rash   Milk (Cow) Nausea And Vomiting   Neomycin-Bacitracin Zn-Polymyx Rash   Past Surgical History:  Procedure Laterality Date   ADENOIDECTOMY     APPENDECTOMY  1972   BREAST BIOPSY Left 09/14/2023   US  LT BREAST BX W LOC DEV 1ST LESION IMG BX SPEC US  GUIDE 09/14/2023 [facility] MAMMOGRAP[facility]   COLONOSCOPY  2019   HYSTEROSCOPY     NASAL SINUS SURGERY  2014   RIB FRACTURE SURGERY  2019   TONSILLECTOMY     Family History  Problem Relation Age of Onset   Stomach cancer Mother    Cancer Mother    Hypertension Father    Heart disease Father    Stroke Father    Diabetes Brother    Allergies Brother    Asthma Brother    Allergic rhinitis Brother    Diabetes Paternal Aunt    Hypertension Brother    Stroke Brother    Allergies Sister    Asthma Sister     Diabetes Sister    Allergic rhinitis Sister    Allergies Son    Allergies Daughter    Cancer Maternal Aunt    Stroke Maternal Uncle    Cancer Paternal Aunt        breast   Breast cancer Paternal Aunt    Cancer Maternal Uncle        pancreatic   Social History   Social History Narrative  Marital status/children/pets: married, 2 children. From [facility]   Education/employment: masters degree. Retired- Sr. Conservator, Museum/gallery:      -smoke alarm in the home:Yes     - wears seatbelt: Yes     - Feels safe in their relationships: Yes    Allergies as of 07/12/2024       Reactions   Erythromycin Anaphylaxis   Influenza Vaccines Anaphylaxis   Penicillins Anaphylaxis   Phenylephrine Anaphylaxis   Tetracycline Anaphylaxis   Tilactase Shortness Of Breath   Other reaction(s): Wheezing Other reaction(s): Wheezing Other reaction(s): Wheezing    Other reaction(s): Wheezing Other reaction(s): Wheezing   Tropicamide Swelling   Benzalkonium Chloride Rash   Lactose Intolerance (gi) Swelling   wheezing   Peach [prunus Persica] Swelling   Peanut Oil    Pt suspects   Pneumococcal Vaccine Other (See Comments)   Pt doesn't take this vaccine as a precaution due to other allergens she has. Other reaction(s): Other (See Comments) Pt doesn't take this vaccine as a precaution due to other allergens she has.   Wound Dressing Adhesive    Bee Venom Rash   Milk (cow) Nausea And Vomiting   Neomycin-bacitracin Zn-polymyx Rash        Medication List        Accurate as of July 12, 2024  9:44 AM. If you have any questions, ask your nurse or doctor.          STOP taking these medications    amitriptyline  10 MG tablet Commonly known as: ELAVIL  Stopped by: [name], DO   mupirocin  ointment 2 % Commonly known as: BACTROBAN  Stopped by: [name], DO   predniSONE  20 MG tablet Commonly known as: DELTASONE  Stopped by: [name], DO       TAKE these medications     albuterol  108 (90 Base) MCG/ACT inhaler Commonly known as: Ventolin  HFA Inhale 2 puffs into the lungs every 6 (six) hours as needed for wheezing or shortness of breath.   alendronate  70 MG tablet Commonly known as: FOSAMAX  Take 1 tablet (70 mg total) by mouth once a week.   amLODipine  5 MG tablet Commonly known as: NORVASC  Take 1 tablet (5 mg total) by mouth daily.   EPINEPHrine  0.3 mg/0.3 mL Soaj injection Commonly known as: EpiPen  2-Pak Inject 0.3 mg into the muscle as needed for anaphylaxis.   KP Fish Oil 1200 MG Caps Take by mouth.   pantoprazole  20 MG tablet Commonly known as: PROTONIX  Take 1 tablet (20 mg total) by mouth daily.   pantoprazole  40 MG tablet Commonly known as: PROTONIX  Take 40 mg by mouth every morning.   Tdap 11-19-13.5 LF-MCG/0.5 injection Commonly known as: ADACEL Inject 0.5 mLs into the muscle once for 1 dose. Started by: [name], DO   triamcinolone  0.025 % ointment Commonly known as: KENALOG  Apply 1 Application topically 2 (two) times daily. Use for 2 weeks at a time for a flare.  Use at night two to three times a week for maintenance dosing.   VITAMIN D3 PO Take by mouth.   Vitamin K1 Powd by Does not apply route.        All past medical history, surgical history, allergies, family history, immunizations andmedications were updated in the EMR today and reviewed under the history and medication portions of their EMR.       ROS: 14 pt review of systems performed and negative (unless mentioned in an HPI)  Objective: BP 122/76   Pulse 72  Temp 98.2 F (36.8 C)   Ht 5' 3 (1.6 m)   Wt 150 lb 6.4 oz (68.2 kg)   LMP 07/21/2008   SpO2 95%   BMI 26.64 kg/m  Physical Exam Vitals and nursing note reviewed.  Constitutional:      General: She is not in acute distress.    Appearance: Normal appearance. She is not ill-appearing, toxic-appearing or diaphoretic.  HENT:     Head: Normocephalic and atraumatic.     Right Ear:  Tympanic membrane, ear canal and external ear normal. There is no impacted cerumen.     Left Ear: Tympanic membrane, ear canal and external ear normal. There is no impacted cerumen.     Nose: No congestion or rhinorrhea.     Mouth/Throat:     Mouth: Mucous membranes are moist.     Pharynx: Oropharynx is clear. No oropharyngeal exudate or posterior oropharyngeal erythema.  Eyes:     General: No scleral icterus.       Right eye: No discharge.        Left eye: No discharge.     Extraocular Movements: Extraocular movements intact.     Conjunctiva/sclera: Conjunctivae normal.     Pupils: Pupils are equal, round, and reactive to light.  Cardiovascular:     Rate and Rhythm: Normal rate and regular rhythm.     Pulses: Normal pulses.     Heart sounds: Normal heart sounds. No murmur heard.    No friction rub. No gallop.  Pulmonary:     Effort: Pulmonary effort is normal. No respiratory distress.     Breath sounds: Normal breath sounds. No stridor. No wheezing, rhonchi or rales.  Chest:     Chest wall: No tenderness.  Abdominal:     General: Abdomen is flat. Bowel sounds are normal. There is no distension.     Palpations: Abdomen is soft. There is no mass.     Tenderness: There is no abdominal tenderness. There is no right CVA tenderness, left CVA tenderness, guarding or rebound.     Hernia: No hernia is present.  Musculoskeletal:        General: No swelling, tenderness or deformity. Normal range of motion.     Cervical back: Normal range of motion and neck supple. No rigidity or tenderness.     Right lower leg: No edema.     Left lower leg: No edema.  Lymphadenopathy:     Cervical: No cervical adenopathy.  Skin:    General: Skin is warm and dry.     Coloration: Skin is not jaundiced or pale.     Findings: No bruising, erythema, lesion or rash.  Neurological:     General: No focal deficit present.     Mental Status: She is alert and oriented to person, place, and time. Mental status is  at baseline.     Cranial Nerves: No cranial nerve deficit.     Sensory: No sensory deficit.     Motor: No weakness.     Coordination: Coordination normal.     Gait: Gait normal.     Deep Tendon Reflexes: Reflexes normal.  Psychiatric:        Mood and Affect: Mood normal.        Behavior: Behavior normal.        Thought Content: Thought content normal.        Judgment: Judgment normal.    No results found.  Assessment/plan: Alexandria Rush is a 73 y.o. female present for  CPE and chronic condition management Benign essential HTN/(BMI 25.0-29.9) Stable Continue amlodipine  5 mg every day- refilled Labs collected  Osteoporosis without current pathological fracture, unspecified osteoporosis type Vit d levels collected Dexa UTD 08/26/2023 -3.2 T-score Continue Fosamax  weekly> has been on med > 5 yr Repeat DEXA 08/2025 - Amb Referral to Osteoporosis Management > consider prolia or Jubbonti  Mild intermittent extrinsic asthma without status asthmaticus without complication/upper airway cough syndrome Stable Continue albuterol  prn- refilled (BMI 25.0-29.9) - Hemoglobin A1c - Lipid panel - Vitamin D  (25 hydroxy) Diabetes mellitus screening - Hemoglobin A1c Breast cancer screening by mammogram - MM 3D SCREENING MAMMOGRAM BILATERAL BREAST; Future  Routine general medical examination at a health care facility (Primary) Colonoscopy: completed 01/07/2023, by [facility], 5-year follow-up Mammogram: completed: 08/26/2023, >> [facility] Immunizations: tdap due-printed for her today, Influenza declined (encouraged yearly), PNA series declined, shingrix allergy - declined. Infectious disease screening: Hep C completed.  DEXA: 08/26/2023, osteoporosis MCHP> 2 yr follow up. 1200 mg calcium daily Patient was encouraged to exercise greater than 150 minutes a week. Patient was encouraged to choose a diet filled with fresh fruits and vegetables, and lean meats. AVS provided to patient today for  education/recommendation on gender specific health and safety maintenance.  Return in about 24 weeks (around 12/27/2024) for Routine chronic condition follow-up.  Orders Placed This Encounter  Procedures   MM 3D SCREENING MAMMOGRAM BILATERAL BREAST   Comprehensive metabolic panel with GFR   Hemoglobin A1c   Lipid panel   TSH   Vitamin D  (25 hydroxy)   CBC   Amb Referral to Osteoporosis Management    Meds ordered this encounter  Medications   Tdap (ADACEL) 11-19-13.5 LF-MCG/0.5 injection    Sig: Inject 0.5 mLs into the muscle once for 1 dose.    Dispense:  0.5 mL    Refill:  0   albuterol  (VENTOLIN  HFA) 108 (90 Base) MCG/ACT inhaler    Sig: Inhale 2 puffs into the lungs every 6 (six) hours as needed for wheezing or shortness of breath.    Dispense:  8 g    Refill:  11   alendronate  (FOSAMAX ) 70 MG tablet    Sig: Take 1 tablet (70 mg total) by mouth once a week.    Dispense:  4 tablet    Refill:  11   amLODipine  (NORVASC ) 5 MG tablet    Sig: Take 1 tablet (5 mg total) by mouth daily.    Dispense:  90 tablet    Refill:  1    Referral Orders         Amb Referral to Osteoporosis Management         Electronically signed by: [name], DO [facility] "

## 2024-07-13 ENCOUNTER — Ambulatory Visit: Payer: Self-pay | Admitting: Family Medicine

## 2024-08-01 ENCOUNTER — Ambulatory Visit: Admitting: Physician Assistant

## 2024-08-01 ENCOUNTER — Encounter: Payer: Self-pay | Admitting: Physician Assistant

## 2024-08-01 VITALS — Ht 63.0 in | Wt 153.8 lb

## 2024-08-01 DIAGNOSIS — M810 Age-related osteoporosis without current pathological fracture: Secondary | ICD-10-CM

## 2024-08-01 NOTE — Progress Notes (Signed)
 "  Office Visit Note   Patient: Alexandria Rush           Date of Birth: Jan 09, 1951           MRN: [id number] Visit Date: 08/01/2024              Requested by: [name] LABOR, DO [address] [place],  [place] [address] PCP: [name] LABOR, DO   Assessment & Plan: Visit Diagnoses:  1. Age-related osteoporosis without current pathological fracture     Plan: Patient is a pleasant 74 year old woman referred for evaluation of osteoporosis by Dr. Catherine.  She does have a history of osteoporosis and has been treated with Fosamax .  Unfortunately she continues to have bone density scans that are showing progression of the osteoporosis.  The most recent scan had a T-score of -3.8.  That put her at almost 3 times the risk for hip fracture.  She has not had any fractures.  She does take adequate vitamin D  and calcium recent lab work support this.  She does have a dairy allergy  so does take a supplement.  She has no history of cancer or heart disease.  She did not take hormone replacement therapy.  She is very good about doing exercise both aerobic and resistive training.  She is not a smoker nor drinker.  I spent 45 minutes discussing treatment possibilities with her husband and herself.  I do not think Fosamax  or Reclast would be appropriate because she has been on bisphosphonate and has had worsening osteoporosis.  Given her risks I do think she would do well with first doing an anabolic medication.  Based on her lifestyle and history I think Tymlos would be best.  I explained that benefits as well as the side effects of this medication.  I also renewed her chart.  She does have quite a few allergies she was concerned about any cross-reactivity.  Follow-Up Instructions: No follow-ups on file.   Orders:  No orders of the defined types were placed in this encounter.  No orders of the defined types were placed in this encounter.     Procedures: No procedures performed   Clinical Data: No additional  findings.   Subjective: No chief complaint on file.   HPI pleasant 73 year old woman referred today for evaluation of osteoporosis.  Has been on Fosamax  but has had declining bone density despite treatment.  Review of Systems  All other systems reviewed and are negative.    Objective: Vital Signs: Ht 5' 3 (1.6 m)   Wt 153 lb 12.8 oz (69.8 kg)   LMP 07/21/2008   BMI 27.24 kg/m   Physical Exam Constitutional:      Appearance: Normal appearance.  Pulmonary:     Effort: Pulmonary effort is normal.     Breath sounds: Normal breath sounds.  Skin:    General: Skin is warm.  Neurological:     General: No focal deficit present.     Mental Status: She is alert.  Psychiatric:        Mood and Affect: Mood normal.        Behavior: Behavior normal.     Ortho Exam  Specialty Comments:  No specialty comments available.  Imaging: No results found.   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   Right ear pain 09/15/2023   Calcium oxalate crystals in urine 06/08/2023   Toenail fungus 05/08/2022  Multiple drug allergies 02/28/2020   Adverse food reaction 02/28/2020   Age-related osteoporosis without current pathological fracture 06/06/2019   E66.3(BMI 25.0-29.9) 05/26/2019   Contact with and (suspected) exposure to other hazardous metals 09/22/2018   Fibromyalgia 09/09/2018   GERD (gastroesophageal reflux disease) 08/20/2018   Asthma, extrinsic, without status asthmaticus 08/20/2018   Benign essential HTN 08/20/2018   Fothergill's neuralgia (Trigeminal) 08/20/2018   Peptic esophagitis 08/20/2018   Allergic rhinitis due to allergen 08/20/2018   Upper airway cough syndrome 12/02/2013   Past Medical History:  Diagnosis Date   Allergy  1971   Anemia 1965   Asthma    Chicken pox    Elevated alkaline phosphatase level 06/27/2019   Elevated MCV 06/08/2023   GERD (gastroesophageal reflux disease)     HSV-1 infection    Hypertension 2020   Left patella fracture 2010   MVP (mitral valve prolapse)    Nasal septal deviation 12/09/2012   Other microscopic hematuria 09/22/2018   Rib fracture 08/27/2017   left 6-9 rib fx- seen of f/u CT- xray was normal   Stomach ulcer    Tibial fracture 2018   left tibial plateau fracture    Family History  Problem Relation Age of Onset   Stomach cancer Mother    Cancer Mother    Hypertension Father    Heart disease Father    Stroke Father    Diabetes Brother    Allergies Brother    Asthma Brother    Allergic rhinitis Brother    Diabetes Paternal Aunt    Hypertension Brother    Stroke Brother    Allergies Sister    Asthma Sister    Diabetes Sister    Allergic rhinitis Sister    Allergies Son    Allergies Daughter    Cancer Maternal Aunt    Stroke Maternal Uncle    Cancer Paternal Aunt        breast   Breast cancer Paternal Aunt    Cancer Maternal Uncle        pancreatic    Past Surgical History:  Procedure Laterality Date   ADENOIDECTOMY     APPENDECTOMY  1972   BREAST BIOPSY Left 09/14/2023   US  LT BREAST BX W LOC DEV 1ST LESION IMG BX SPEC US  GUIDE 09/14/2023 [facility]AMMOGRAP[facility]   COLONOSCOPY  2019   HYSTEROSCOPY     NASAL SINUS SURGERY  2014   RIB FRACTURE SURGERY  2019   TONSILLECTOMY     Social History   Occupational History   Occupation: Teaching Laboratory Technician: [employer]  Tobacco Use   Smoking status: Never   Smokeless tobacco: Never  Vaping Use   Vaping status: Never Used  Substance and Sexual Activity   Alcohol use: Not Currently   Drug use: No   Sexual activity: Yes    Partners: Male    Birth control/protection: Post-menopausal    Comment: 1st intercourse 74 yo-Fewer than 5 partners, no abnormal pap, no std, no DES        "

## 2024-08-08 ENCOUNTER — Telehealth: Payer: Self-pay | Admitting: Physician Assistant

## 2024-08-08 NOTE — Telephone Encounter (Signed)
 Pt called saying that she had and apt last Monday and hasn't heard anything back from that apt. Call back number is [phone].

## 2024-08-09 ENCOUNTER — Other Ambulatory Visit: Payer: Self-pay | Admitting: Radiology

## 2024-08-09 MED ORDER — TYMLOS 3120 MCG/1.56ML ~~LOC~~ SOPN: 80.0000 ug | PEN_INJECTOR | Freq: Every day | SUBCUTANEOUS | 12 refills | Status: AC

## 2024-08-09 NOTE — Telephone Encounter (Signed)
 PT contacted and advised Tymlos  prior authorization is in progress. PT stated if I haven't heard anything by Feb 1, I will call back for status update

## 2024-08-09 NOTE — Telephone Encounter (Signed)
 I submitted Tymlos  Rx to [employer].  Tried to start the PA online at covermymeds, and could not.  Pharm will send us  a Key to login and use to access the PA and enter info.  It is definitely in progress now.  Thanks!

## 2024-08-26 ENCOUNTER — Telehealth: Payer: Self-pay

## 2024-08-26 NOTE — Telephone Encounter (Signed)
 PT contacted regarding Tymlos  via phone. Has not started as Rx is scheduled to be delivered today, 08/26/24. Advised to DC Fosamax  prior to beginning Tymlos . No updated labs required at this time.

## 2024-12-27 ENCOUNTER — Ambulatory Visit: Admitting: Family Medicine

## 2025-05-22 ENCOUNTER — Encounter: Admitting: Obstetrics and Gynecology
